# Patient Record
Sex: Female | Born: 1950
Health system: Southern US, Community
[De-identification: ages and names within clinical notes are randomized; demographics above are authoritative.]

## PROBLEM LIST (undated history)

## (undated) ENCOUNTER — Emergency Department (HOSPITAL_COMMUNITY): Payer: Medicare Other | Source: Home / Self Care

## (undated) DIAGNOSIS — K449 Diaphragmatic hernia without obstruction or gangrene: Secondary | ICD-10-CM

## (undated) DIAGNOSIS — I10 Essential (primary) hypertension: Secondary | ICD-10-CM

## (undated) DIAGNOSIS — N201 Calculus of ureter: Secondary | ICD-10-CM

## (undated) DIAGNOSIS — K219 Gastro-esophageal reflux disease without esophagitis: Secondary | ICD-10-CM

## (undated) DIAGNOSIS — Z860100 Personal history of colon polyps, unspecified: Secondary | ICD-10-CM

## (undated) DIAGNOSIS — Z8719 Personal history of other diseases of the digestive system: Secondary | ICD-10-CM

## (undated) DIAGNOSIS — D649 Anemia, unspecified: Secondary | ICD-10-CM

## (undated) DIAGNOSIS — E119 Type 2 diabetes mellitus without complications: Secondary | ICD-10-CM

## (undated) DIAGNOSIS — Z85028 Personal history of other malignant neoplasm of stomach: Secondary | ICD-10-CM

## (undated) DIAGNOSIS — Z794 Long term (current) use of insulin: Secondary | ICD-10-CM

## (undated) HISTORY — PX: VAGINAL HYSTERECTOMY: SUR661

## (undated) HISTORY — PX: UMBILICAL HERNIA REPAIR: SHX196

---

## 1986-06-11 HISTORY — PX: LAPAROSCOPIC PARTIAL GASTRECTOMY: SHX1933

## 1997-08-03 ENCOUNTER — Ambulatory Visit (HOSPITAL_COMMUNITY): Admission: RE | Admit: 1997-08-03 | Discharge: 1997-08-03 | Payer: Self-pay | Admitting: Surgery

## 1998-03-03 ENCOUNTER — Encounter: Payer: Self-pay | Admitting: Otolaryngology

## 1998-03-03 ENCOUNTER — Ambulatory Visit (HOSPITAL_COMMUNITY): Admission: RE | Admit: 1998-03-03 | Discharge: 1998-03-03 | Payer: Self-pay | Admitting: Otolaryngology

## 1998-10-21 ENCOUNTER — Encounter: Payer: Self-pay | Admitting: Surgery

## 1998-10-21 ENCOUNTER — Ambulatory Visit (HOSPITAL_COMMUNITY): Admission: RE | Admit: 1998-10-21 | Discharge: 1998-10-21 | Payer: Self-pay | Admitting: Surgery

## 1999-11-16 ENCOUNTER — Encounter: Payer: Self-pay | Admitting: Obstetrics and Gynecology

## 1999-11-16 ENCOUNTER — Ambulatory Visit (HOSPITAL_COMMUNITY): Admission: RE | Admit: 1999-11-16 | Discharge: 1999-11-16 | Payer: Self-pay | Admitting: Obstetrics and Gynecology

## 1999-12-22 ENCOUNTER — Encounter: Admission: RE | Admit: 1999-12-22 | Discharge: 2000-03-21 | Payer: Self-pay | Admitting: Internal Medicine

## 2000-12-17 ENCOUNTER — Ambulatory Visit (HOSPITAL_COMMUNITY): Admission: RE | Admit: 2000-12-17 | Discharge: 2000-12-17 | Payer: Self-pay | Admitting: Obstetrics and Gynecology

## 2000-12-17 ENCOUNTER — Encounter: Payer: Self-pay | Admitting: Obstetrics and Gynecology

## 2001-12-22 ENCOUNTER — Ambulatory Visit (HOSPITAL_COMMUNITY): Admission: RE | Admit: 2001-12-22 | Discharge: 2001-12-22 | Payer: Self-pay | Admitting: Obstetrics and Gynecology

## 2001-12-22 ENCOUNTER — Encounter: Payer: Self-pay | Admitting: Obstetrics and Gynecology

## 2003-01-06 ENCOUNTER — Encounter: Payer: Self-pay | Admitting: Obstetrics and Gynecology

## 2003-01-06 ENCOUNTER — Ambulatory Visit (HOSPITAL_COMMUNITY): Admission: RE | Admit: 2003-01-06 | Discharge: 2003-01-06 | Payer: Self-pay | Admitting: Obstetrics and Gynecology

## 2004-03-29 ENCOUNTER — Ambulatory Visit (HOSPITAL_COMMUNITY): Admission: RE | Admit: 2004-03-29 | Discharge: 2004-03-29 | Payer: Self-pay | Admitting: Obstetrics and Gynecology

## 2004-10-31 ENCOUNTER — Ambulatory Visit (HOSPITAL_COMMUNITY): Admission: RE | Admit: 2004-10-31 | Discharge: 2004-10-31 | Payer: Self-pay | Admitting: Gastroenterology

## 2004-11-08 ENCOUNTER — Ambulatory Visit (HOSPITAL_COMMUNITY): Admission: RE | Admit: 2004-11-08 | Discharge: 2004-11-08 | Payer: Self-pay | Admitting: Internal Medicine

## 2005-04-16 ENCOUNTER — Ambulatory Visit (HOSPITAL_COMMUNITY): Admission: RE | Admit: 2005-04-16 | Discharge: 2005-04-16 | Payer: Self-pay | Admitting: Obstetrics and Gynecology

## 2005-10-10 ENCOUNTER — Encounter: Payer: Self-pay | Admitting: Otolaryngology

## 2006-04-25 ENCOUNTER — Ambulatory Visit (HOSPITAL_COMMUNITY): Admission: RE | Admit: 2006-04-25 | Discharge: 2006-04-25 | Payer: Self-pay | Admitting: Obstetrics and Gynecology

## 2007-05-29 ENCOUNTER — Ambulatory Visit (HOSPITAL_COMMUNITY): Admission: RE | Admit: 2007-05-29 | Discharge: 2007-05-29 | Payer: Self-pay | Admitting: Obstetrics and Gynecology

## 2010-10-27 NOTE — Op Note (Signed)
Angela Harvey, Angela Harvey                ACCOUNT NO.:  [id number]   MEDICAL RECORD NO.:  [id number]          PATIENT TYPE:  AMB   LOCATION:  ENDO                         FACILITY:  [facility]   PHYSICIAN:  Bernette Redbird, M.D.   DATE OF BIRTH:  10/03/1950   DATE OF PROCEDURE:  10/31/2004  DATE OF DISCHARGE:                                 OPERATIVE REPORT   PROCEDURE:  Colonoscopy (incomplete).   INDICATION:  A 60 year old female without worrisome risk factors or  symptoms, for initial screening examination.   FINDINGS:  Normal exam to the mid colon.  Unable to reach the cecum due to  severe obesity and presence of a ventral hernia.   PROCEDURE:  The nature, purpose, and risks of the procedure had been  discussed with the patient who provided written consent.  Sedation was  fentanyl 75 mcg and Versed 7.5 mg IV without arrhythmias or significant  desaturation (the patient's O2 saturation dropped slightly prior to the  procedure so we turned up the flow rate of the O2, and she remained in the  90s throughout the procedure).   For this procedure, we used the Olympus adult adjustable tension video  colonoscope which was advanced through its entire length of 170 cm.  Initially, the scope protruded through the patient's ventral hernia, but we  then placed the patient in the supine position and applied external  abdominal compression and were able to advance beyond the hernia to full  scope insertion.  The proximal extent of the exam is not known.  Based on  the colonoscopic appearance, and the amount of scope inserted, I would  imagine we were in the midportion of the colon, but it is difficult to be  sure.  Despite taking out loops, etc., it was not possible to advance  farther, so pullback was performed.  The quality of the prep was very good,  and it is felt that all areas up to the limit of the exam were well seen.   Apart from the incomplete character of this exam, this was a normal  examination.  No polyps, cancer, colitis, vascular malformations, or  diverticulosis were noted. and retroflexion of the rectum was normal.  No  biopsies were obtained.  The patient tolerated the procedure well, and there  no apparent complications.   IMPRESSION:  Normal limited screening colonoscopy (V76.51).   Unable to reach cecum due to presence of ventral hernia, and the patient's  obese body habitus with probably some degree of colonic redundancy.   PLAN:  Barium enema this afternoon to check the more proximal sections of  the colon.      RB/MEDQ  D:  10/31/2004  T:  10/31/2004  Job:  [id number]   cc:   Geoffry Paradise, M.D.  [address]  [place]  [place] [address]  Fax: [phone]

## 2012-09-16 ENCOUNTER — Other Ambulatory Visit (HOSPITAL_COMMUNITY): Payer: Self-pay | Admitting: Internal Medicine

## 2012-09-16 DIAGNOSIS — Z1231 Encounter for screening mammogram for malignant neoplasm of breast: Secondary | ICD-10-CM

## 2012-10-15 ENCOUNTER — Ambulatory Visit (HOSPITAL_COMMUNITY): Payer: Self-pay

## 2012-10-16 ENCOUNTER — Ambulatory Visit (HOSPITAL_COMMUNITY)
Admission: RE | Admit: 2012-10-16 | Discharge: 2012-10-16 | Disposition: A | Discharge status: Home / Self Care | Payer: BC Managed Care – PPO | Source: Ambulatory Visit | Attending: Internal Medicine | Admitting: Internal Medicine

## 2012-10-16 DIAGNOSIS — Z1231 Encounter for screening mammogram for malignant neoplasm of breast: Secondary | ICD-10-CM

## 2013-08-26 ENCOUNTER — Other Ambulatory Visit: Payer: Self-pay | Admitting: Internal Medicine

## 2013-08-26 DIAGNOSIS — M549 Dorsalgia, unspecified: Secondary | ICD-10-CM

## 2013-08-30 ENCOUNTER — Ambulatory Visit
Admission: RE | Admit: 2013-08-30 | Discharge: 2013-08-30 | Disposition: A | Discharge status: Home / Self Care | Payer: BC Managed Care – PPO | Source: Ambulatory Visit | Attending: Internal Medicine | Admitting: Internal Medicine

## 2013-08-30 DIAGNOSIS — M549 Dorsalgia, unspecified: Secondary | ICD-10-CM

## 2013-12-18 ENCOUNTER — Other Ambulatory Visit (HOSPITAL_COMMUNITY): Payer: Self-pay | Admitting: Internal Medicine

## 2013-12-18 DIAGNOSIS — Z1231 Encounter for screening mammogram for malignant neoplasm of breast: Secondary | ICD-10-CM

## 2013-12-28 ENCOUNTER — Ambulatory Visit (HOSPITAL_COMMUNITY)
Admission: RE | Admit: 2013-12-28 | Discharge: 2013-12-28 | Disposition: A | Discharge status: Home / Self Care | Payer: BC Managed Care – PPO | Source: Ambulatory Visit | Attending: Internal Medicine | Admitting: Internal Medicine

## 2013-12-28 DIAGNOSIS — Z1231 Encounter for screening mammogram for malignant neoplasm of breast: Secondary | ICD-10-CM | POA: Insufficient documentation

## 2014-06-21 DIAGNOSIS — I10 Essential (primary) hypertension: Secondary | ICD-10-CM | POA: Diagnosis not present

## 2014-06-21 DIAGNOSIS — E119 Type 2 diabetes mellitus without complications: Secondary | ICD-10-CM | POA: Diagnosis not present

## 2014-06-21 DIAGNOSIS — K219 Gastro-esophageal reflux disease without esophagitis: Secondary | ICD-10-CM | POA: Diagnosis not present

## 2014-06-21 DIAGNOSIS — E669 Obesity, unspecified: Secondary | ICD-10-CM | POA: Diagnosis not present

## 2014-06-21 DIAGNOSIS — M1990 Unspecified osteoarthritis, unspecified site: Secondary | ICD-10-CM | POA: Diagnosis not present

## 2014-06-21 DIAGNOSIS — Z6836 Body mass index (BMI) 36.0-36.9, adult: Secondary | ICD-10-CM | POA: Diagnosis not present

## 2014-07-08 DIAGNOSIS — H40013 Open angle with borderline findings, low risk, bilateral: Secondary | ICD-10-CM | POA: Diagnosis not present

## 2014-07-12 DIAGNOSIS — Z8719 Personal history of other diseases of the digestive system: Secondary | ICD-10-CM

## 2014-07-12 HISTORY — DX: Personal history of other diseases of the digestive system: Z87.19

## 2014-08-08 ENCOUNTER — Inpatient Hospital Stay (HOSPITAL_COMMUNITY)
Admission: EM | Admit: 2014-08-08 | Discharge: 2014-08-17 | DRG: 418 | Disposition: A | Discharge status: Home / Self Care | Payer: Medicare Other | Attending: Internal Medicine | Admitting: Internal Medicine

## 2014-08-08 ENCOUNTER — Emergency Department (HOSPITAL_COMMUNITY): Payer: Medicare Other

## 2014-08-08 ENCOUNTER — Encounter (HOSPITAL_COMMUNITY): Payer: Self-pay | Admitting: Emergency Medicine

## 2014-08-08 DIAGNOSIS — E43 Unspecified severe protein-calorie malnutrition: Secondary | ICD-10-CM

## 2014-08-08 DIAGNOSIS — R932 Abnormal findings on diagnostic imaging of liver and biliary tract: Secondary | ICD-10-CM | POA: Diagnosis not present

## 2014-08-08 DIAGNOSIS — Z90710 Acquired absence of both cervix and uterus: Secondary | ICD-10-CM | POA: Diagnosis not present

## 2014-08-08 DIAGNOSIS — Z6834 Body mass index (BMI) 34.0-34.9, adult: Secondary | ICD-10-CM

## 2014-08-08 DIAGNOSIS — R1010 Upper abdominal pain, unspecified: Secondary | ICD-10-CM | POA: Diagnosis not present

## 2014-08-08 DIAGNOSIS — I10 Essential (primary) hypertension: Secondary | ICD-10-CM | POA: Diagnosis present

## 2014-08-08 DIAGNOSIS — N390 Urinary tract infection, site not specified: Secondary | ICD-10-CM | POA: Diagnosis not present

## 2014-08-08 DIAGNOSIS — K859 Acute pancreatitis without necrosis or infection, unspecified: Secondary | ICD-10-CM

## 2014-08-08 DIAGNOSIS — E440 Moderate protein-calorie malnutrition: Secondary | ICD-10-CM | POA: Diagnosis present

## 2014-08-08 DIAGNOSIS — E119 Type 2 diabetes mellitus without complications: Secondary | ICD-10-CM | POA: Diagnosis not present

## 2014-08-08 DIAGNOSIS — E118 Type 2 diabetes mellitus with unspecified complications: Secondary | ICD-10-CM | POA: Diagnosis not present

## 2014-08-08 DIAGNOSIS — R112 Nausea with vomiting, unspecified: Secondary | ICD-10-CM | POA: Diagnosis not present

## 2014-08-08 DIAGNOSIS — D649 Anemia, unspecified: Secondary | ICD-10-CM | POA: Diagnosis present

## 2014-08-08 DIAGNOSIS — Z79899 Other long term (current) drug therapy: Secondary | ICD-10-CM

## 2014-08-08 DIAGNOSIS — Z9884 Bariatric surgery status: Secondary | ICD-10-CM

## 2014-08-08 DIAGNOSIS — K219 Gastro-esophageal reflux disease without esophagitis: Secondary | ICD-10-CM | POA: Diagnosis present

## 2014-08-08 DIAGNOSIS — K8020 Calculus of gallbladder without cholecystitis without obstruction: Secondary | ICD-10-CM | POA: Diagnosis not present

## 2014-08-08 DIAGNOSIS — R1084 Generalized abdominal pain: Secondary | ICD-10-CM | POA: Diagnosis not present

## 2014-08-08 DIAGNOSIS — Z419 Encounter for procedure for purposes other than remedying health state, unspecified: Secondary | ICD-10-CM

## 2014-08-08 DIAGNOSIS — R1013 Epigastric pain: Secondary | ICD-10-CM | POA: Diagnosis not present

## 2014-08-08 DIAGNOSIS — K851 Biliary acute pancreatitis without necrosis or infection: Secondary | ICD-10-CM | POA: Diagnosis present

## 2014-08-08 DIAGNOSIS — K760 Fatty (change of) liver, not elsewhere classified: Secondary | ICD-10-CM | POA: Diagnosis not present

## 2014-08-08 DIAGNOSIS — R9431 Abnormal electrocardiogram [ECG] [EKG]: Secondary | ICD-10-CM | POA: Diagnosis not present

## 2014-08-08 DIAGNOSIS — K8010 Calculus of gallbladder with chronic cholecystitis without obstruction: Secondary | ICD-10-CM | POA: Diagnosis present

## 2014-08-08 DIAGNOSIS — E876 Hypokalemia: Secondary | ICD-10-CM | POA: Diagnosis not present

## 2014-08-08 DIAGNOSIS — K449 Diaphragmatic hernia without obstruction or gangrene: Secondary | ICD-10-CM | POA: Diagnosis present

## 2014-08-08 DIAGNOSIS — K8080 Other cholelithiasis without obstruction: Secondary | ICD-10-CM | POA: Diagnosis not present

## 2014-08-08 HISTORY — DX: Essential (primary) hypertension: I10

## 2014-08-08 HISTORY — DX: Diaphragmatic hernia without obstruction or gangrene: K44.9

## 2014-08-08 LAB — URINALYSIS, ROUTINE W REFLEX MICROSCOPIC
Glucose, UA: >1000 mg/dL — AB
Hgb urine dipstick: NEGATIVE
Ketones, ur: >80 mg/dL — AB
Nitrite: NEGATIVE
Protein, ur: 30 mg/dL — AB
Specific Gravity, Urine: 1.029 (ref 1.005–1.030)
Urobilinogen, UA: 0.2 mg/dL (ref 0.0–1.0)
pH: 5.5 (ref 5.0–8.0)

## 2014-08-08 LAB — COMPREHENSIVE METABOLIC PANEL
ALT: 21 U/L (ref 0–35)
AST: 29 U/L (ref 0–37)
Albumin: 4.1 g/dL (ref 3.5–5.2)
Alkaline Phosphatase: 59 U/L (ref 39–117)
Anion gap: 15 (ref 5–15)
BUN: 17 mg/dL (ref 6–23)
CO2: 28 mmol/L (ref 19–32)
Calcium: 11.9 mg/dL — ABNORMAL HIGH (ref 8.4–10.5)
Chloride: 96 mmol/L (ref 96–112)
Creatinine, Ser: 0.87 mg/dL (ref 0.50–1.10)
GFR calc Af Amer: 79 mL/min — ABNORMAL LOW (ref >90)
GFR calc non Af Amer: 68 mL/min — ABNORMAL LOW (ref >90)
Glucose, Bld: 380 mg/dL — ABNORMAL HIGH (ref 70–99)
Potassium: 2.4 mmol/L — CL (ref 3.5–5.1)
Sodium: 139 mmol/L (ref 135–145)
Total Bilirubin: 1 mg/dL (ref 0.3–1.2)
Total Protein: 7.2 g/dL (ref 6.0–8.3)

## 2014-08-08 LAB — GLUCOSE, CAPILLARY: Glucose-Capillary: 302 mg/dL — ABNORMAL HIGH (ref 70–99)

## 2014-08-08 LAB — CBC WITH DIFFERENTIAL/PLATELET
Basophils Absolute: 0 10*3/uL (ref 0.0–0.1)
Basophils Relative: 0 % (ref 0–1)
Eosinophils Absolute: 0 10*3/uL (ref 0.0–0.7)
Eosinophils Relative: 0 % (ref 0–5)
HCT: 43.8 % (ref 36.0–46.0)
Hemoglobin: 15.5 g/dL — ABNORMAL HIGH (ref 12.0–15.0)
Lymphocytes Relative: 3 % — ABNORMAL LOW (ref 12–46)
Lymphs Abs: 0.6 10*3/uL — ABNORMAL LOW (ref 0.7–4.0)
MCH: 31.4 pg (ref 26.0–34.0)
MCHC: 35.4 g/dL (ref 30.0–36.0)
MCV: 88.8 fL (ref 78.0–100.0)
Monocytes Absolute: 1.6 10*3/uL — ABNORMAL HIGH (ref 0.1–1.0)
Monocytes Relative: 8 % (ref 3–12)
Neutro Abs: 17.7 10*3/uL — ABNORMAL HIGH (ref 1.7–7.7)
Neutrophils Relative %: 89 % — ABNORMAL HIGH (ref 43–77)
Platelets: 287 10*3/uL (ref 150–400)
RBC: 4.93 MIL/uL (ref 3.87–5.11)
RDW: 13.3 % (ref 11.5–15.5)
WBC: 19.9 10*3/uL — ABNORMAL HIGH (ref 4.0–10.5)

## 2014-08-08 LAB — LIPASE, BLOOD: Lipase: 1435 U/L — ABNORMAL HIGH (ref 11–59)

## 2014-08-08 LAB — URINE MICROSCOPIC-ADD ON

## 2014-08-08 LAB — MAGNESIUM: Magnesium: 0.8 mg/dL — CL (ref 1.5–2.5)

## 2014-08-08 MED ORDER — INSULIN ASPART 100 UNIT/ML ~~LOC~~ SOLN
0.0000 [IU] | Freq: Three times a day (TID) | SUBCUTANEOUS | Status: DC
  Administered 2014-08-09 – 2014-08-10 (×5): 3 [IU] via SUBCUTANEOUS
  Administered 2014-08-10 – 2014-08-11 (×2): 5 [IU] via SUBCUTANEOUS
  Administered 2014-08-11 – 2014-08-12 (×4): 3 [IU] via SUBCUTANEOUS
  Administered 2014-08-12: 2 [IU] via SUBCUTANEOUS
  Administered 2014-08-13: 7 [IU] via SUBCUTANEOUS
  Administered 2014-08-13: 2 [IU] via SUBCUTANEOUS
  Administered 2014-08-14: 3 [IU] via SUBCUTANEOUS
  Administered 2014-08-14 – 2014-08-15 (×2): 2 [IU] via SUBCUTANEOUS

## 2014-08-08 MED ORDER — ONDANSETRON HCL 4 MG PO TABS
4.0000 mg | ORAL_TABLET | Freq: Four times a day (QID) | ORAL | Status: DC | PRN
  Administered 2014-08-13 (×2): 4 mg via ORAL
  Filled 2014-08-08 (×2): qty 1

## 2014-08-08 MED ORDER — ONDANSETRON HCL 4 MG/2ML IJ SOLN
4.0000 mg | Freq: Four times a day (QID) | INTRAMUSCULAR | Status: DC | PRN
  Administered 2014-08-09 – 2014-08-12 (×8): 4 mg via INTRAVENOUS
  Filled 2014-08-08 (×9): qty 2

## 2014-08-08 MED ORDER — MORPHINE SULFATE 4 MG/ML IJ SOLN
4.0000 mg | Freq: Once | INTRAMUSCULAR | Status: AC
  Administered 2014-08-08: 4 mg via INTRAVENOUS
  Filled 2014-08-08: qty 1

## 2014-08-08 MED ORDER — HYDROCODONE-ACETAMINOPHEN 5-325 MG PO TABS
1.0000 | ORAL_TABLET | ORAL | Status: DC | PRN
  Administered 2014-08-08: 1 via ORAL
  Administered 2014-08-09 – 2014-08-13 (×7): 2 via ORAL
  Administered 2014-08-14: 1 via ORAL
  Administered 2014-08-14: 2 via ORAL
  Filled 2014-08-08: qty 1
  Filled 2014-08-08 (×9): qty 2

## 2014-08-08 MED ORDER — ENOXAPARIN SODIUM 40 MG/0.4ML ~~LOC~~ SOLN
40.0000 mg | SUBCUTANEOUS | Status: DC
  Administered 2014-08-09 – 2014-08-12 (×4): 40 mg via SUBCUTANEOUS
  Filled 2014-08-08 (×5): qty 0.4

## 2014-08-08 MED ORDER — METOCLOPRAMIDE HCL 10 MG PO TABS
10.0000 mg | ORAL_TABLET | Freq: Three times a day (TID) | ORAL | Status: DC
  Administered 2014-08-08 – 2014-08-11 (×8): 10 mg via ORAL
  Filled 2014-08-08 (×10): qty 1

## 2014-08-08 MED ORDER — ADULT MULTIVITAMIN W/MINERALS CH
1.0000 | ORAL_TABLET | Freq: Every day | ORAL | Status: DC
  Administered 2014-08-09 – 2014-08-15 (×6): 1 via ORAL
  Filled 2014-08-08 (×8): qty 1

## 2014-08-08 MED ORDER — MORPHINE SULFATE 4 MG/ML IJ SOLN
4.0000 mg | INTRAMUSCULAR | Status: DC | PRN
  Administered 2014-08-09 – 2014-08-10 (×7): 4 mg via INTRAVENOUS
  Filled 2014-08-08 (×7): qty 1

## 2014-08-08 MED ORDER — OCUVITE PO TABS
1.0000 | ORAL_TABLET | Freq: Every day | ORAL | Status: DC
  Administered 2014-08-09 – 2014-08-15 (×7): 1 via ORAL
  Filled 2014-08-08 (×8): qty 1

## 2014-08-08 MED ORDER — POTASSIUM CHLORIDE 10 MEQ/100ML IV SOLN
10.0000 meq | Freq: Once | INTRAVENOUS | Status: AC
  Administered 2014-08-08: 10 meq via INTRAVENOUS
  Filled 2014-08-08: qty 100

## 2014-08-08 MED ORDER — INSULIN ASPART 100 UNIT/ML ~~LOC~~ SOLN
0.0000 [IU] | Freq: Every day | SUBCUTANEOUS | Status: DC
  Administered 2014-08-08: 4 [IU] via SUBCUTANEOUS
  Administered 2014-08-09 – 2014-08-13 (×4): 2 [IU] via SUBCUTANEOUS

## 2014-08-08 MED ORDER — ACETAMINOPHEN 650 MG RE SUPP: 650.0000 mg | Freq: Four times a day (QID) | RECTAL | Status: DC | PRN

## 2014-08-08 MED ORDER — POTASSIUM CHLORIDE CRYS ER 20 MEQ PO TBCR
40.0000 meq | EXTENDED_RELEASE_TABLET | Freq: Once | ORAL | Status: AC
  Administered 2014-08-08: 40 meq via ORAL
  Filled 2014-08-08: qty 2

## 2014-08-08 MED ORDER — MAGNESIUM SULFATE 2 GM/50ML IV SOLN
2.0000 g | Freq: Once | INTRAVENOUS | Status: AC
  Administered 2014-08-08: 2 g via INTRAVENOUS
  Filled 2014-08-08: qty 50

## 2014-08-08 MED ORDER — POTASSIUM CHLORIDE IN NACL 40-0.9 MEQ/L-% IV SOLN
INTRAVENOUS | Status: DC
  Administered 2014-08-08 – 2014-08-12 (×7): 75 mL/h via INTRAVENOUS
  Filled 2014-08-08 (×9): qty 1000

## 2014-08-08 MED ORDER — INSULIN GLARGINE 100 UNIT/ML ~~LOC~~ SOLN
10.0000 [IU] | Freq: Every day | SUBCUTANEOUS | Status: DC
  Administered 2014-08-08: 10 [IU] via SUBCUTANEOUS
  Filled 2014-08-08: qty 0.1

## 2014-08-08 MED ORDER — ALUM & MAG HYDROXIDE-SIMETH 200-200-20 MG/5ML PO SUSP
30.0000 mL | Freq: Four times a day (QID) | ORAL | Status: DC | PRN
  Administered 2014-08-13: 30 mL via ORAL
  Filled 2014-08-08: qty 30

## 2014-08-08 MED ORDER — ACETAMINOPHEN 325 MG PO TABS: 650.0000 mg | ORAL_TABLET | Freq: Four times a day (QID) | ORAL | Status: DC | PRN

## 2014-08-08 MED ORDER — POLYETHYLENE GLYCOL 3350 17 G PO PACK: 17.0000 g | PACK | Freq: Every day | ORAL | Status: DC | PRN

## 2014-08-08 MED ORDER — SERTRALINE HCL 100 MG PO TABS
100.0000 mg | ORAL_TABLET | Freq: Every day | ORAL | Status: DC
  Administered 2014-08-09 – 2014-08-15 (×7): 100 mg via ORAL
  Filled 2014-08-08 (×8): qty 1

## 2014-08-08 MED ORDER — IOHEXOL 300 MG/ML  SOLN
100.0000 mL | Freq: Once | INTRAMUSCULAR | Status: AC | PRN
  Administered 2014-08-08: 100 mL via INTRAVENOUS

## 2014-08-08 MED ORDER — ZOLPIDEM TARTRATE 5 MG PO TABS
5.0000 mg | ORAL_TABLET | Freq: Every evening | ORAL | Status: DC | PRN
  Administered 2014-08-08 – 2014-08-13 (×3): 5 mg via ORAL
  Filled 2014-08-08 (×3): qty 1

## 2014-08-08 MED ORDER — ONDANSETRON HCL 4 MG/2ML IJ SOLN
4.0000 mg | Freq: Once | INTRAMUSCULAR | Status: AC
Start: 2014-08-08 — End: 2014-08-08
  Administered 2014-08-08: 4 mg via INTRAVENOUS
  Filled 2014-08-08: qty 2

## 2014-08-08 MED ORDER — CELECOXIB 200 MG PO CAPS
200.0000 mg | ORAL_CAPSULE | Freq: Every day | ORAL | Status: DC
  Filled 2014-08-08: qty 1

## 2014-08-08 NOTE — ED Provider Notes (Signed)
CSN: [id number]     Arrival date & time 08/08/14  1219 History   First MD Initiated Contact with Patient 08/08/14 1401     Chief Complaint  Patient presents with  . Abdominal Pain  . Nausea    Angela Harvey is a 64 y.o. female with a history of diabetes, hypertension, hiatal hernia and bariatric surgery who presents the emergency department complaining of nausea and vomiting ongoing for 1 week with severe abdominal pain generalized her abdomen since last night. She reports vomiting twice today. She also reports lots of belching and taking 5 tums a day. She reports nausea even with drinking water. She reports it seems to be worse at night. She currently rates her abdominal pain at 6 out of 10 and generalized her abdomen and worse with movement. She is unable to localize an area of pain in her abdomen. Her last BM was 3 days ago and was normal. She is followed by gastroenterologist Dr. Dorrene German. She denies history of pancreatitis. The patient denies fevers, chills, sick contacts, chest pain, shortness of breath, palpitations, hematemesis, hematochezia, diarrhea.  (Consider location/radiation/quality/duration/timing/severity/associated sxs/prior Treatment) HPI  Past Medical History  Diagnosis Date  . Hiatal hernia   . Hypertension   . Diabetes mellitus without complication    Past Surgical History  Procedure Laterality Date  . Hernia repair     No family history on file. History  Substance Use Topics  . Smoking status: Never Smoker   . Smokeless tobacco: Not on file  . Alcohol Use: No   OB History    No data available     Review of Systems  Constitutional: Negative for fever and chills.  HENT: Negative for congestion and sore throat.   Eyes: Negative for pain and visual disturbance.  Respiratory: Negative for cough, shortness of breath and wheezing.   Cardiovascular: Negative for chest pain and palpitations.  Gastrointestinal: Positive for nausea, vomiting and abdominal pain.  Negative for diarrhea and blood in stool.  Genitourinary: Negative for dysuria, urgency, frequency, hematuria, flank pain, vaginal bleeding, vaginal discharge and difficulty urinating.  Musculoskeletal: Negative for back pain and neck pain.  Skin: Negative for rash.  Neurological: Negative for weakness, numbness and headaches.      Allergies  Review of patient's allergies indicates no known allergies.  Home Medications   Prior to Admission medications   Medication Sig Start Date End Date Taking? Authorizing Provider  celecoxib (CELEBREX) 200 MG capsule Take 200 mg by mouth daily.  07/28/14  Yes Historical Provider, MD  KRILL OIL PO Take 1 capsule by mouth daily.   Yes Historical Provider, MD  metFORMIN (GLUCOPHAGE) 500 MG tablet Take 500 mg by mouth 2 (two) times daily. 07/08/14  Yes Historical Provider, MD  metoCLOPramide (REGLAN) 10 MG tablet Take 10 mg by mouth 3 (three) times daily. 07/08/14  Yes Historical Provider, MD  Multiple Vitamin (MULTIVITAMIN WITH MINERALS) TABS tablet Take 1 tablet by mouth daily.   Yes Historical Provider, MD  multivitamin-lutein (OCUVITE-LUTEIN) CAPS capsule Take 1 capsule by mouth daily.   Yes Historical Provider, MD  sertraline (ZOLOFT) 100 MG tablet Take 100 mg by mouth daily. 07/08/14  Yes Historical Provider, MD   BP 182/71 mmHg  Pulse 75  Temp(Src) 98.2 F (36.8 C) (Oral)  Resp 16  SpO2 98% Physical Exam  Constitutional: She is oriented to person, place, and time. She appears well-developed and well-nourished. No distress.  Obese female. Non-toxic appearing.   HENT:  Head: Normocephalic and atraumatic.  Mouth/Throat: Oropharynx is clear and moist. No oropharyngeal exudate.  Eyes: Conjunctivae are normal. Pupils are equal, round, and reactive to light. Right eye exhibits no discharge. Left eye exhibits no discharge.  Neck: Neck supple.  Cardiovascular: Normal rate, regular rhythm, normal heart sounds and intact distal pulses.  Exam reveals no  gallop and no friction rub.   No murmur heard. Bilateral radial pulses are intact. HR is 88.   Pulmonary/Chest: Effort normal and breath sounds normal. No respiratory distress. She has no wheezes. She has no rales.  Abdominal: Soft. Bowel sounds are normal. She exhibits no distension and no mass. There is tenderness. There is no rebound and no guarding.  Abdomen is soft. Bowel sounds are present. Abdomen is generally tender to palpation worse in her upper abdomen.   Musculoskeletal: She exhibits no edema.  No lower extremity edema.   Lymphadenopathy:    She has no cervical adenopathy.  Neurological: She is alert and oriented to person, place, and time. Coordination normal.  Skin: Skin is warm and dry. No rash noted. She is not diaphoretic. No erythema. No pallor.  Psychiatric: She has a normal mood and affect. Her behavior is normal.  Nursing note and vitals reviewed.   ED Course  Procedures (including critical care time) Labs Review Labs Reviewed  CBC WITH DIFFERENTIAL/PLATELET - Abnormal; Notable for the following:    WBC 19.9 (*)    Hemoglobin 15.5 (*)    Neutrophils Relative % 89 (*)    Neutro Abs 17.7 (*)    Lymphocytes Relative 3 (*)    Lymphs Abs 0.6 (*)    Monocytes Absolute 1.6 (*)    All other components within normal limits  COMPREHENSIVE METABOLIC PANEL - Abnormal; Notable for the following:    Potassium 2.4 (*)    Glucose, Bld 380 (*)    Calcium 11.9 (*)    GFR calc non Af Amer 68 (*)    GFR calc Af Amer 79 (*)    All other components within normal limits  LIPASE, BLOOD - Abnormal; Notable for the following:    Lipase 1435 (*)    All other components within normal limits  URINALYSIS, ROUTINE W REFLEX MICROSCOPIC - Abnormal; Notable for the following:    APPearance TURBID (*)    Glucose, UA >1000 (*)    Bilirubin Urine MODERATE (*)    Ketones, ur >80 (*)    Protein, ur 30 (*)    Leukocytes, UA TRACE (*)    All other components within normal limits  MAGNESIUM  - Abnormal; Notable for the following:    Magnesium 0.8 (*)    All other components within normal limits  URINE MICROSCOPIC-ADD ON - Abnormal; Notable for the following:    Squamous Epithelial / LPF FEW (*)    Bacteria, UA MANY (*)    All other components within normal limits    Imaging Review No results found.   EKG Interpretation   Date/Time:  Sunday August 08 2014 14:49:46 EST Ventricular Rate:  75 PR Interval:  129 QRS Duration: 89 QT Interval:  377 QTC Calculation: 421 R Axis:   -21 Text Interpretation:  Normal sinus rhythm Borderline left axis deviation  Low voltage, precordial leads Borderline T abnormalities, anterior leads  No old tracing to compare Confirmed by [place] ([id number]) on  08/08/2014 2:55:20 PM      Filed Vitals:   08/08/14 1229 08/08/14 1505 08/08/14 1621  BP: 136/97 182/83 182/71  Pulse: 91 73 75  Temp: 98.2 F (36.8 C)    TempSrc: Oral    Resp: 16 20 16   SpO2: 100% 98% 98%    MDM   Meds given in ED:  Medications  magnesium sulfate IVPB 2 g 50 mL (2 g Intravenous New Bag/Given 08/08/14 1633)  potassium chloride 10 mEq in 100 mL IVPB (0 mEq Intravenous Stopped 08/08/14 1633)  ondansetron (ZOFRAN) injection 4 mg (4 mg Intravenous Given 08/08/14 1600)  morphine 4 MG/ML injection 4 mg (4 mg Intravenous Given 08/08/14 1601)  potassium chloride SA (K-DUR,KLOR-CON) CR tablet 40 mEq (40 mEq Oral Given 08/08/14 1601)  iohexol (OMNIPAQUE) 300 MG/ML solution 100 mL (100 mLs Intravenous Contrast Given 08/08/14 1708)    New Prescriptions   No medications on file    Final diagnoses:  Acute pancreatitis, unspecified pancreatitis type  Hypokalemia  Hypomagnesemia   This is a 64 y.o. female with a history of diabetes, hypertension, hiatal hernia and bariatric surgery who presents the emergency department complaining of nausea and vomiting ongoing for 1 week with severe abdominal pain generalized her abdomen since last night. The patient is afebrile  and nontoxic-appearing. The patient has generalized abdominal tenderness palpation that is worse in her epigastrium.  Patient is hypokalemic with a potassium 2.4. Her magnesium is 0.8. Patient given oral and IV potassium in the ED. EKG shows normal sinus rhythm. She has leukocytosis with a white count of 19.9. Her lipase is elevated at 1435. Plan is to CT abd and likely admission. She has no history of pancreatitis.  17:20 Patient care handed off to [facility] at shift change.   This patient was discussed with and evaluated by Dr. Regenia Skeeter who agrees with assessment and plan.     Hanley Hays, PA-C 08/08/14 1721  Sherwood Gambler, MD 08/16/14 2350

## 2014-08-08 NOTE — ED Provider Notes (Signed)
Patient is a 63 year old female signed out to me at shift change by previous provider. Patient here for 1 week of nausea and vomiting and 2 days of severe epigastric pain. Basic labs show hypokalemia and hypomagnesemia and mild hypercalcemia. Patient also has an elevated lipase level at 1400. Patient has no history of pancreatitis. CT scan is currently pending. Plan is to admit the patient after follow-up on CT scan. Patient seen by Dr. Joya Salm. Physical Exam  BP 182/71 mmHg  Pulse 75  Temp(Src) 98.2 F (36.8 C) (Oral)  Resp 16  SpO2 98%  Physical Exam  Constitutional: She is oriented to person, place, and time. She appears well-developed and well-nourished. No distress.  HENT:  Head: Normocephalic and atraumatic.  Mouth/Throat: Oropharynx is clear and moist. No oropharyngeal exudate.  Eyes: Conjunctivae and EOM are normal. Pupils are equal, round, and reactive to light. No scleral icterus.  Neck: Normal range of motion. Neck supple. No JVD present. No thyromegaly present.  Cardiovascular: Normal rate, regular rhythm and intact distal pulses.  Exam reveals no gallop and no friction rub.   Murmur heard. Pulmonary/Chest: Effort normal and breath sounds normal. No respiratory distress. She has no wheezes. She has no rales. She exhibits no tenderness.  Abdominal: Soft. Normal appearance and bowel sounds are normal. She exhibits no distension and no mass. There is tenderness in the epigastric area. There is no rigidity, no rebound, no guarding, no tenderness at McBurney's point and negative Murphy's sign.  Lymphadenopathy:    She has no cervical adenopathy.  Neurological: She is alert and oriented to person, place, and time.  Skin: Skin is warm and dry. She is not diaphoretic.  Psychiatric: She has a normal mood and affect. Her behavior is normal. Judgment and thought content normal.  Nursing note and vitals reviewed.   ED Course  Procedures  MDM  Patient is a 64 year old female who  presents emergency room for evaluation of epigastric pain with severe nausea and vomiting. On examination patient's pain is currently a 6 out of 10. CT scan shows stranding around the pancreas consistent with acute pancreatitis. There are no other acute abnormalities visualized on the scan. Patient does have hypomagnesemia and hypokalemia which were replaced here in the ED. Patient does have mild hypercalcemia which may be related to excessive Tums use. Will call Dr. Joya Salm for admission. I spoke with the on-call physician for [place] who will come to see the patient here in the ED and will admit the patient.      Cherylann Parr, PA-C 08/08/14 1833  Nat Christen, MD 08/11/14 [phone]

## 2014-08-08 NOTE — H&P (Signed)
PCP:   Geoffery Lyons, MD   Chief Complaint:  Nausea and vomiting, midabdominal pain  HPI: Angela Harvey is a 64 y.o. female with a history of diabetes mellitus type 2, hypertension, and hiatal hernia who presents the emergency department complaining of nausea and vomiting ongoing for 1 week with severe abdominal pain generalized her abdomen since last night. She reports vomiting twice today. She was taking 5 Tums a day to try to get her pain under control.. She reports nausea even with drinking water. She denies alcohol intake. She denies history of pancreatitis, but has an elevated Lipase and an abdominal CT which confirms this diagnosis. She will be admitted for further management.  Review of Systems:  Review of Systems - History obtained from the patient General ROS: positive for  - malaise Ophthalmic ROS: negative ENT ROS: negative Hematological and Lymphatic ROS: negative Endocrine ROS: positive for - polydipsia/polyuria Respiratory ROS: no cough, shortness of breath, or wheezing Cardiovascular ROS: no chest pain or dyspnea on exertion Gastrointestinal ROS: positive for - abdominal pain, appetite loss and nausea/vomiting Genito-Urinary ROS: no dysuria, trouble voiding, or hematuria Musculoskeletal ROS: negative Neurological ROS: no TIA or stroke symptoms Past Medical History: Past Medical History  Diagnosis Date  . Hiatal hernia   . Hypertension   . Diabetes mellitus without complication    Past Surgical History  Procedure Laterality Date  . Hernia repair      Medications: Prior to Admission medications   Medication Sig Start Date End Date Taking? Authorizing Provider  celecoxib (CELEBREX) 200 MG capsule Take 200 mg by mouth daily.  07/28/14  Yes Historical Provider, MD  KRILL OIL PO Take 1 capsule by mouth daily.   Yes Historical Provider, MD  metFORMIN (GLUCOPHAGE) 500 MG tablet Take 500 mg by mouth 2 (two) times daily. 07/08/14  Yes Historical Provider, MD   metoCLOPramide (REGLAN) 10 MG tablet Take 10 mg by mouth 3 (three) times daily. 07/08/14  Yes Historical Provider, MD  Multiple Vitamin (MULTIVITAMIN WITH MINERALS) TABS tablet Take 1 tablet by mouth daily.   Yes Historical Provider, MD  multivitamin-lutein (OCUVITE-LUTEIN) CAPS capsule Take 1 capsule by mouth daily.   Yes Historical Provider, MD  sertraline (ZOLOFT) 100 MG tablet Take 100 mg by mouth daily. 07/08/14  Yes Historical Provider, MD    Allergies:  No Known Allergies  Social History:  reports that she has never smoked. She does not have any smokeless tobacco history on file. She reports that she does not drink alcohol. Her drug history is not on file.  Family History: No family history on file.  Physical Exam: Filed Vitals:   08/08/14 1229 08/08/14 1505 08/08/14 1621 08/08/14 1818  BP: 136/97 182/83 182/71 161/71  Pulse: 91 73 75 95  Temp: 98.2 F (36.8 C)     TempSrc: Oral     Resp: 16 20 16 22   SpO2: 100% 98% 98% 95%   General appearance: alert, cooperative and appears stated age Head: Normocephalic, without obvious abnormality, atraumatic Eyes: conjunctivae/corneas clear. PERRL, EOM's intact.  Nose: Nares normal. Septum midline. Mucosa normal. No drainage or sinus tenderness. Throat: lips, mucosa, and tongue normal; teeth and gums normal Neck: no adenopathy, no carotid bruit, no JVD and thyroid not enlarged, symmetric, no tenderness/mass/nodules Resp: clear to auscultation bilaterally Cardio: regular rate and rhythm, S1, S2 normal, no murmur, click, rub or gallop GI: soft,globally tender, but worse in upper abdome without rebound pain. Extremities: extremities normal, atraumatic, no cyanosis or edema Pulses: 2+  and symmetric Lymph nodes: Cervical adenopathy: no cervical lymphadenopathy Neurologic: Alert and oriented X 3, normal strength and tone. Normal symmetric reflexes.     Labs on Admission:   Recent Labs  08/08/14 1235  NA 139  K 2.4*  CL 96  CO2  28  GLUCOSE 380*  BUN 17  CREATININE 0.87  CALCIUM 11.9*  MG 0.8*    Recent Labs  08/08/14 1235  AST 29  ALT 21  ALKPHOS 59  BILITOT 1.0  PROT 7.2  ALBUMIN 4.1    Recent Labs  08/08/14 1235  LIPASE 1435*    Recent Labs  08/08/14 1235  WBC 19.9*  NEUTROABS 17.7*  HGB 15.5*  HCT 43.8  MCV 88.8  PLT 287   No results for input(s): CKTOTAL, CKMB, CKMBINDEX, TROPONINI in the last 72 hours. No results found for: INR, PROTIME No results for input(s): TSH, T4TOTAL, T3FREE, THYROIDAB in the last 72 hours.  Invalid input(s): FREET3 No results for input(s): VITAMINB12, FOLATE, FERRITIN, TIBC, IRON, RETICCTPCT in the last 72 hours.  Radiological Exams on Admission: Ct Abdomen Pelvis W Contrast  08/08/2014   ADDENDUM REPORT: 08/08/2014 17:51  ADDENDUM: Typographical error within the impression. The first sentence should read  Edematous appearing pancreas with extensive stranding and fluid around the pancreas and in the left anterior para renal space compatible with acute pancreatitis.   Electronically Signed   By: Rolm Baptise M.D.   On: 08/08/2014 17:51   08/08/2014   CLINICAL DATA:  Generalized abdominal pain beginning last night. Nausea, vomiting.  EXAM: CT ABDOMEN AND PELVIS WITH CONTRAST  TECHNIQUE: Multidetector CT imaging of the abdomen and pelvis was performed using the standard protocol following bolus administration of intravenous contrast.  CONTRAST:  141mL OMNIPAQUE IOHEXOL 300 MG/ML  SOLN  COMPARISON:  12/11/2012  FINDINGS: The distal scratch head the visualized distal esophagus is fluid-filled and dilated, possibly related to reflux. Lung bases are clear. No effusions. Heart is normal size.  Postoperative changes in the stomach. There is stranding noted around the pancreas with fluid in the left anterior para renal space and inferior to the pancreas compatible with acute pancreatitis. Pancreas looks prominent/edematous. No focal abnormality.  Mild diffuse fatty  infiltration of the liver. Gallbladder, spleen, adrenals and kidneys are normal. Large and small bowel are unremarkable. There is free fluid in the cul-de-sac of the pelvis. Prior hysterectomy. No adnexal masses. Urinary bladder is unremarkable.  Degenerative changes in the lower lumbar spine. No acute bony abnormality or focal bone lesion.  IMPRESSION: Edematous appearing pancreas with extensive stranding and fluid around the pancreas and in the left anterior para renal space compatible with acute appendicitis. Dilated, fluid-filled distal esophagus, presumably related to reflux. Postoperative changes in the region of the stomach.  Mild diffuse fatty infiltration of the liver.  Electronically Signed: By: Rolm Baptise M.D. On: 08/08/2014 17:33   Orders placed or performed during the hospital encounter of 08/08/14  . ED EKG  . ED EKG  . EKG 12-Lead  . EKG 12-Lead    Assessment/Plan Active Problems:   Acute pancreatitis IV hydration, NPO for now, repeat Lipase levels in AM.  Confirmed on CT.  May need to consider GI eval if not improving with bowel rest.  IV fluids. Hypokalemia:  Replaced in ER< high K+ IV fluids initiated. Hypomagnesemia:  Replaced in ER< recheck in AM Diabetes Mellitis Type 2:  Glucose in urine, CBG 38-0 on admission.  Will hold her metformin and initiate basal insulin +  SSI as well HTN:  Fluids, follow BP, may need to add agents as becomes volume replete.  TISOVEC,RICHARD W 08/08/2014, 7:34 PM

## 2014-08-08 NOTE — ED Notes (Signed)
Pt from home c/o generalized abdominal pain since last pm with nausea and vomiting. Denies urinary symptoms. She reports she has vomited 12 times in last 24 hours.

## 2014-08-09 ENCOUNTER — Encounter (HOSPITAL_COMMUNITY): Payer: Self-pay

## 2014-08-09 LAB — COMPREHENSIVE METABOLIC PANEL
ALT: 16 U/L (ref 0–35)
AST: 25 U/L (ref 0–37)
Albumin: 3.4 g/dL — ABNORMAL LOW (ref 3.5–5.2)
Alkaline Phosphatase: 54 U/L (ref 39–117)
Anion gap: 10 (ref 5–15)
BUN: 22 mg/dL (ref 6–23)
CO2: 31 mmol/L (ref 19–32)
Calcium: 10.8 mg/dL — ABNORMAL HIGH (ref 8.4–10.5)
Chloride: 100 mmol/L (ref 96–112)
Creatinine, Ser: 0.79 mg/dL (ref 0.50–1.10)
GFR calc Af Amer: >90 mL/min (ref >90)
GFR calc non Af Amer: 86 mL/min — ABNORMAL LOW (ref >90)
Glucose, Bld: 238 mg/dL — ABNORMAL HIGH (ref 70–99)
Potassium: 3 mmol/L — ABNORMAL LOW (ref 3.5–5.1)
Sodium: 141 mmol/L (ref 135–145)
Total Bilirubin: 0.7 mg/dL (ref 0.3–1.2)
Total Protein: 6.2 g/dL (ref 6.0–8.3)

## 2014-08-09 LAB — GLUCOSE, CAPILLARY
Glucose-Capillary: 233 mg/dL — ABNORMAL HIGH (ref 70–99)
Glucose-Capillary: 247 mg/dL — ABNORMAL HIGH (ref 70–99)
Glucose-Capillary: 245 mg/dL — ABNORMAL HIGH (ref 70–99)
Glucose-Capillary: 204 mg/dL — ABNORMAL HIGH (ref 70–99)

## 2014-08-09 LAB — LIPASE, BLOOD: Lipase: 839 U/L — ABNORMAL HIGH (ref 11–59)

## 2014-08-09 LAB — CBC
HCT: 41.2 % (ref 36.0–46.0)
Hemoglobin: 14.4 g/dL (ref 12.0–15.0)
MCH: 31.1 pg (ref 26.0–34.0)
MCHC: 35 g/dL (ref 30.0–36.0)
MCV: 89 fL (ref 78.0–100.0)
Platelets: 267 10*3/uL (ref 150–400)
RBC: 4.63 MIL/uL (ref 3.87–5.11)
RDW: 13.6 % (ref 11.5–15.5)
WBC: 22.8 10*3/uL — ABNORMAL HIGH (ref 4.0–10.5)

## 2014-08-09 LAB — MAGNESIUM: Magnesium: 1.2 mg/dL — ABNORMAL LOW (ref 1.5–2.5)

## 2014-08-09 MED ORDER — INSULIN GLARGINE 100 UNIT/ML ~~LOC~~ SOLN
15.0000 [IU] | Freq: Every day | SUBCUTANEOUS | Status: DC
  Administered 2014-08-09 – 2014-08-15 (×7): 15 [IU] via SUBCUTANEOUS
  Filled 2014-08-09 (×7): qty 0.15

## 2014-08-09 MED ORDER — ENOXAPARIN SODIUM 40 MG/0.4ML ~~LOC~~ SOLN: 40.0000 mg | SUBCUTANEOUS | Status: DC

## 2014-08-09 MED ORDER — POTASSIUM CHLORIDE 10 MEQ/100ML IV SOLN
10.0000 meq | INTRAVENOUS | Status: AC
  Administered 2014-08-09 (×2): 10 meq via INTRAVENOUS
  Filled 2014-08-09 (×4): qty 100

## 2014-08-09 MED ORDER — PANTOPRAZOLE SODIUM 40 MG IV SOLR
40.0000 mg | Freq: Every day | INTRAVENOUS | Status: DC
Start: 2014-08-09 — End: 2014-08-13
  Administered 2014-08-09 – 2014-08-12 (×4): 40 mg via INTRAVENOUS
  Filled 2014-08-09 (×5): qty 40

## 2014-08-09 NOTE — Progress Notes (Signed)
Subjective: Episode of emesis this am noted--claims pain much better  Objective: Vital signs in last 24 hours: Temp:  [98.2 F (36.8 C)-98.9 F (37.2 C)] 98.7 F (37.1 C) (02/29 0626) Pulse Rate:  [73-102] 101 (02/29 0230) Resp:  [16-27] 18 (02/29 0626) BP: (136-182)/(62-97) 161/83 mmHg (02/29 0626) SpO2:  [91 %-100 %] 98 % (02/29 0626) Weight:  [89.1 kg (196 lb 6.9 oz)] 89.1 kg (196 lb 6.9 oz) (02/28 2039) Weight change:   CBG (last 3)   Recent Labs  08/08/14 2200 08/09/14 0736  GLUCAP 302* 233*    Intake/Output from previous day:    Physical Exam: A/a no distress, bright and conversant No jvd Chest clear rrr-no murmur Abdomen soft, good bs, minimal epigastric tenderness No edema Intact pulses   Lab Results:  Recent Labs  08/08/14 1235 08/09/14 0522  NA 139 141  K 2.4* 3.0*  CL 96 100  CO2 28 31  GLUCOSE 380* 238*  BUN 17 22  CREATININE 0.87 0.79  CALCIUM 11.9* 10.8*  MG 0.8* 1.2*    Recent Labs  08/08/14 1235 08/09/14 0522  AST 29 25  ALT 21 16  ALKPHOS 59 54  BILITOT 1.0 0.7  PROT 7.2 6.2  ALBUMIN 4.1 3.4*    Recent Labs  08/08/14 1235 08/09/14 0522  WBC 19.9* 22.8*  NEUTROABS 17.7*  --   HGB 15.5* 14.4  HCT 43.8 41.2  MCV 88.8 89.0  PLT 287 267   No results found for: INR, PROTIME No results for input(s): CKTOTAL, CKMB, CKMBINDEX, TROPONINI in the last 72 hours. No results for input(s): TSH, T4TOTAL, T3FREE, THYROIDAB in the last 72 hours.  Invalid input(s): FREET3 No results for input(s): VITAMINB12, FOLATE, FERRITIN, TIBC, IRON, RETICCTPCT in the last 72 hours.  Studies/Results: Ct Abdomen Pelvis W Contrast  08/08/2014   ADDENDUM REPORT: 08/08/2014 17:51  ADDENDUM: Typographical error within the impression. The first sentence should read  Edematous appearing pancreas with extensive stranding and fluid around the pancreas and in the left anterior para renal space compatible with acute pancreatitis.   Electronically Signed    By: Rolm Baptise M.D.   On: 08/08/2014 17:51   08/08/2014   CLINICAL DATA:  Generalized abdominal pain beginning last night. Nausea, vomiting.  EXAM: CT ABDOMEN AND PELVIS WITH CONTRAST  TECHNIQUE: Multidetector CT imaging of the abdomen and pelvis was performed using the standard protocol following bolus administration of intravenous contrast.  CONTRAST:  171mL OMNIPAQUE IOHEXOL 300 MG/ML  SOLN  COMPARISON:  12/11/2012  FINDINGS: The distal scratch head the visualized distal esophagus is fluid-filled and dilated, possibly related to reflux. Lung bases are clear. No effusions. Heart is normal size.  Postoperative changes in the stomach. There is stranding noted around the pancreas with fluid in the left anterior para renal space and inferior to the pancreas compatible with acute pancreatitis. Pancreas looks prominent/edematous. No focal abnormality.  Mild diffuse fatty infiltration of the liver. Gallbladder, spleen, adrenals and kidneys are normal. Large and small bowel are unremarkable. There is free fluid in the cul-de-sac of the pelvis. Prior hysterectomy. No adnexal masses. Urinary bladder is unremarkable.  Degenerative changes in the lower lumbar spine. No acute bony abnormality or focal bone lesion.  IMPRESSION: Edematous appearing pancreas with extensive stranding and fluid around the pancreas and in the left anterior para renal space compatible with acute appendicitis. Dilated, fluid-filled distal esophagus, presumably related to reflux. Postoperative changes in the region of the stomach.  Mild diffuse fatty infiltration of the  liver.  Electronically Signed: By: Rolm Baptise M.D. On: 08/08/2014 17:33     Assessment/Plan: 1. Acute pancreatitis- pain improved, lipase dropping- bowel rest, ivf, ppi 2. DM2- no complications-will hold metformin 3. HTN- stable 4. Pyuria--send urine culture-empiric cipro given wbc   LOS: 1 day   Angela Harvey A 08/09/2014, 7:57 AM

## 2014-08-09 NOTE — Progress Notes (Signed)
Ms Steffler alert and oriented , reported an episode of about 50 ml coffee color/ dark brown  Emesis. Pt had about 133ml of clear to tan color  Emesis also  Overnight.

## 2014-08-09 NOTE — Progress Notes (Signed)
UR complete 

## 2014-08-10 ENCOUNTER — Inpatient Hospital Stay (HOSPITAL_COMMUNITY): Payer: Medicare Other

## 2014-08-10 LAB — COMPREHENSIVE METABOLIC PANEL
ALT: 15 U/L (ref 0–35)
AST: 37 U/L (ref 0–37)
Albumin: 2.9 g/dL — ABNORMAL LOW (ref 3.5–5.2)
Alkaline Phosphatase: 65 U/L (ref 39–117)
Anion gap: 7 (ref 5–15)
BUN: 33 mg/dL — ABNORMAL HIGH (ref 6–23)
CO2: 32 mmol/L (ref 19–32)
Calcium: 9.6 mg/dL (ref 8.4–10.5)
Chloride: 104 mmol/L (ref 96–112)
Creatinine, Ser: 1.02 mg/dL (ref 0.50–1.10)
GFR calc Af Amer: 65 mL/min — ABNORMAL LOW (ref >90)
GFR calc non Af Amer: 56 mL/min — ABNORMAL LOW (ref >90)
Glucose, Bld: 255 mg/dL — ABNORMAL HIGH (ref 70–99)
Potassium: 4.3 mmol/L (ref 3.5–5.1)
Sodium: 143 mmol/L (ref 135–145)
Total Bilirubin: 1.4 mg/dL — ABNORMAL HIGH (ref 0.3–1.2)
Total Protein: 5.5 g/dL — ABNORMAL LOW (ref 6.0–8.3)

## 2014-08-10 LAB — GLUCOSE, CAPILLARY
Glucose-Capillary: 220 mg/dL — ABNORMAL HIGH (ref 70–99)
Glucose-Capillary: 231 mg/dL — ABNORMAL HIGH (ref 70–99)
Glucose-Capillary: 254 mg/dL — ABNORMAL HIGH (ref 70–99)
Glucose-Capillary: 229 mg/dL — ABNORMAL HIGH (ref 70–99)

## 2014-08-10 LAB — HEMOGLOBIN A1C
Hgb A1c MFr Bld: 6.7 % — ABNORMAL HIGH (ref 4.8–5.6)
Mean Plasma Glucose: 146 mg/dL

## 2014-08-10 LAB — LIPASE, BLOOD: Lipase: 160 U/L — ABNORMAL HIGH (ref 11–59)

## 2014-08-10 LAB — CBC
HCT: 35.7 % — ABNORMAL LOW (ref 36.0–46.0)
Hemoglobin: 12.3 g/dL (ref 12.0–15.0)
MCH: 31.5 pg (ref 26.0–34.0)
MCHC: 34.5 g/dL (ref 30.0–36.0)
MCV: 91.5 fL (ref 78.0–100.0)
Platelets: 226 10*3/uL (ref 150–400)
RBC: 3.9 MIL/uL (ref 3.87–5.11)
RDW: 14.1 % (ref 11.5–15.5)
WBC: 17.9 10*3/uL — ABNORMAL HIGH (ref 4.0–10.5)

## 2014-08-10 MED ORDER — IOHEXOL 300 MG/ML  SOLN
100.0000 mL | Freq: Once | INTRAMUSCULAR | Status: AC | PRN
  Administered 2014-08-10: 100 mL via INTRAVENOUS

## 2014-08-10 MED ORDER — BISACODYL 10 MG RE SUPP
10.0000 mg | Freq: Once | RECTAL | Status: AC
  Administered 2014-08-10: 10 mg via RECTAL
  Filled 2014-08-10: qty 1

## 2014-08-10 MED ORDER — IOHEXOL 300 MG/ML  SOLN
25.0000 mL | INTRAMUSCULAR | Status: AC
  Administered 2014-08-10 (×2): 25 mL via ORAL

## 2014-08-10 NOTE — Progress Notes (Signed)
CARE MANAGEMENT NOTE 08/10/2014  Patient:  Angela Harvey, Angela Harvey   Account Number:  [id number]  Date Initiated:  08/10/2014  Documentation initiated by:  Edwyna Shell  Subjective/Objective Assessment:   64 yo female admitted with acute pancreatitis from home     Action/Plan:   discharge planning   Anticipated DC Date:  08/13/2014   Anticipated DC Plan:  [place]  CM consult      Choice offered to / List presented to:             Status of service:  In process, will continue to follow Medicare Important Message given?   (If response is "NO", the following Medicare IM given date fields will be blank) Date Medicare IM given:   Medicare IM given by:   Date Additional Medicare IM given:   Additional Medicare IM given by:    Discharge Disposition:    Per UR Regulation:    If discussed at Long Length of Stay Meetings, dates discussed:    Comments:  08/10/14 Edwyna Shell RN BSN CM [phone] Patient lives at home with spouse, drives self. She has had [place] services with [facility] in the past. Will continue to follow for any discharge planning needs.

## 2014-08-10 NOTE — Consult Note (Addendum)
Referring Provider: Dr. Reynaldo Minium Primary Care Physician:  Geoffery Lyons, MD Primary Gastroenterologist:  Dr. Cristina Gong  Reason for Consultation:  Acute Pancreatitis  HPI: Angela Harvey is a 64 y.o. female admitted on 08/08/14 with acute pancreatitis having the acute onset of upper quadrant abdominal pain with N/V for the week prior to admission. Lipase 1435 with normal LFTs on admit. Denies previous history of pancreatitis. Denies alcohol. Denies any new meds prior to admit. CT on 08/08/14 and 08/10/14 showing diffuse inflammation consistent with acute pancreatitis. Gallbladder normal on CT. Husband at bedside.   Past Medical History  Diagnosis Date  . Hiatal hernia   . Hypertension   . Diabetes mellitus without complication   Morbid obesity  Past Surgical History  Procedure Laterality Date  . Hernia repair      Prior to Admission medications   Medication Sig Start Date End Date Taking? Authorizing Provider  celecoxib (CELEBREX) 200 MG capsule Take 200 mg by mouth daily.  07/28/14  Yes Historical Provider, MD  KRILL OIL PO Take 1 capsule by mouth daily.   Yes Historical Provider, MD  metFORMIN (GLUCOPHAGE) 500 MG tablet Take 500 mg by mouth 2 (two) times daily. 07/08/14  Yes Historical Provider, MD  metoCLOPramide (REGLAN) 10 MG tablet Take 10 mg by mouth 3 (three) times daily. 07/08/14  Yes Historical Provider, MD  Multiple Vitamin (MULTIVITAMIN WITH MINERALS) TABS tablet Take 1 tablet by mouth daily.   Yes Historical Provider, MD  multivitamin-lutein (OCUVITE-LUTEIN) CAPS capsule Take 1 capsule by mouth daily.   Yes Historical Provider, MD  sertraline (ZOLOFT) 100 MG tablet Take 100 mg by mouth daily. 07/08/14  Yes Historical Provider, MD    Scheduled Meds: . beta carotene w/minerals  1 tablet Oral Daily  . bisacodyl  10 mg Rectal Once  . enoxaparin (LOVENOX) injection  40 mg Subcutaneous Q24H  . insulin aspart  0-5 Units Subcutaneous QHS  . insulin aspart  0-9 Units Subcutaneous  TID WC  . insulin glargine  15 Units Subcutaneous QHS  . metoCLOPramide  10 mg Oral TID WC  . multivitamin with minerals  1 tablet Oral Daily  . pantoprazole (PROTONIX) IV  40 mg Intravenous QHS  . sertraline  100 mg Oral Daily   Continuous Infusions: . 0.9 % NaCl with KCl 40 mEq / L 75 mL/hr (08/10/14 0434)   PRN Meds:.acetaminophen **OR** acetaminophen, alum & mag hydroxide-simeth, HYDROcodone-acetaminophen, morphine injection, ondansetron **OR** ondansetron (ZOFRAN) IV, polyethylene glycol, zolpidem  Allergies as of 08/08/2014  . (No Known Allergies)    History reviewed. No pertinent family history.  History   Social History  . Marital Status: Married    Spouse Name: N/A  . Number of Children: N/A  . Years of Education: N/A   Occupational History  . Not on file.   Social History Main Topics  . Smoking status: Never Smoker   . Smokeless tobacco: Not on file  . Alcohol Use: No  . Drug Use: Not on file  . Sexual Activity: Not on file   Other Topics Concern  . Not on file   Social History Narrative    Review of Systems: All negative from GI standpoint except as stated above in HPI.  Physical Exam: Vital signs: Filed Vitals:   08/10/14 1545  BP: 152/70  Pulse: 108  Temp: 99.8 F (37.7 C)  Resp: 20   Last BM Date: 08/07/14 General:   Lethargic, morbidly obese, no acute distress HEENT: anicteric Lungs:  Clear throughout to  auscultation.   No wheezes, crackles, or rhonchi. No acute distress. Heart:  Regular rate and rhythm; no murmurs, clicks, rubs,  or gallops. Abdomen: epigastric, LUQ, and RUQ tenderness with minimal guarding, soft, nondistended, obese  Rectal:  Deferred Ext: no edema  GI:  Lab Results:  Recent Labs  08/08/14 1235 08/09/14 0522 08/10/14 0450  WBC 19.9* 22.8* 17.9*  HGB 15.5* 14.4 12.3  HCT 43.8 41.2 35.7*  PLT 287 267 226   BMET  Recent Labs  08/08/14 1235 08/09/14 0522 08/10/14 0450  NA 139 141 143  K 2.4* 3.0* 4.3   CL 96 100 104  CO2 28 31 32  GLUCOSE 380* 238* 255*  BUN 17 22 33*  CREATININE 0.87 0.79 1.02  CALCIUM 11.9* 10.8* 9.6   LFT  Recent Labs  08/10/14 0450  PROT 5.5*  ALBUMIN 2.9*  AST 37  ALT 15  ALKPHOS 65  BILITOT 1.4*   Lipase 1435 (08/08/14) 839 (08/09/14) 160 (08/10/14)   Studies/Results: Ct Abdomen W Contrast  08/10/2014   CLINICAL DATA:  Acute pancreatitis.  Abdominal pain, nausea.  EXAM: CT ABDOMEN WITH CONTRAST  TECHNIQUE: Multidetector CT imaging of the abdomen was performed using the standard protocol following bolus administration of intravenous contrast.  CONTRAST:  193mL OMNIPAQUE IOHEXOL 300 MG/ML  SOLN  COMPARISON:  08/08/2014  FINDINGS: Scarring or atelectasis in the lingula and in the lung bases. No effusions. Heart is normal size.  Postoperative changes in the region of the stomach. There is stranding/inflammation noted around the pancreas diffusely compatible with acute pancreatitis. The pancreas appears to be enhancing normally. Amount of peripancreatic inflammation is similar to prior study. Acute fluid collections within the left anterior para renal space has decreased.  Portal vein and splenic vein are patent. Diffuse fatty infiltration of the liver. Spleen, adrenals and kidneys are unremarkable.  Visualized large and small bowel are unremarkable.  IMPRESSION: Continued inflammation/ stranding around the pancreas, similar to prior study. The fluid in the left anterior para renal space has decreased.   Electronically Signed   By: Rolm Baptise M.D.   On: 08/10/2014 15:31   Ct Abdomen Pelvis W Contrast  08/08/2014   ADDENDUM REPORT: 08/08/2014 17:51  ADDENDUM: Typographical error within the impression. The first sentence should read  Edematous appearing pancreas with extensive stranding and fluid around the pancreas and in the left anterior para renal space compatible with acute pancreatitis.   Electronically Signed   By: Rolm Baptise M.D.   On: 08/08/2014 17:51    08/08/2014   CLINICAL DATA:  Generalized abdominal pain beginning last night. Nausea, vomiting.  EXAM: CT ABDOMEN AND PELVIS WITH CONTRAST  TECHNIQUE: Multidetector CT imaging of the abdomen and pelvis was performed using the standard protocol following bolus administration of intravenous contrast.  CONTRAST:  162mL OMNIPAQUE IOHEXOL 300 MG/ML  SOLN  COMPARISON:  12/11/2012  FINDINGS: The distal scratch head the visualized distal esophagus is fluid-filled and dilated, possibly related to reflux. Lung bases are clear. No effusions. Heart is normal size.  Postoperative changes in the stomach. There is stranding noted around the pancreas with fluid in the left anterior para renal space and inferior to the pancreas compatible with acute pancreatitis. Pancreas looks prominent/edematous. No focal abnormality.  Mild diffuse fatty infiltration of the liver. Gallbladder, spleen, adrenals and kidneys are normal. Large and small bowel are unremarkable. There is free fluid in the cul-de-sac of the pelvis. Prior hysterectomy. No adnexal masses. Urinary bladder is unremarkable.  Degenerative changes in  the lower lumbar spine. No acute bony abnormality or focal bone lesion.  IMPRESSION: Edematous appearing pancreas with extensive stranding and fluid around the pancreas and in the left anterior para renal space compatible with acute appendicitis. Dilated, fluid-filled distal esophagus, presumably related to reflux. Postoperative changes in the region of the stomach.  Mild diffuse fatty infiltration of the liver.  Electronically Signed: By: Rolm Baptise M.D. On: 08/08/2014 17:33    Impression/Plan: Acute pancreatitis without any evidence of biliary source. No evidence of necrosis or pseudocysts on CT. I think her pain is related to her pancreatitis and not a peptic ulcer source. Will need an EUS when pancreatitis has resolved completely (4-6 weeks as outpt). MRCP is another option but I am not sure an adequate study could be  obtained due to her body habitus and weakness (doubt she could hold her breath adequately at this time for that test).  If pain persists, then may need an EGD to look for other sources. Continue supportive care. Will change to clear liquid diet tomorrow morning and slowly advance to low fat diet.    LOS: 2 days   Clifton C.  08/10/2014, 4:47 PM

## 2014-08-10 NOTE — Progress Notes (Signed)
Subjective: Claims no better--pain, n/v--no bm  Objective: Vital signs in last 24 hours: Temp:  [98.2 F (36.8 C)-99.3 F (37.4 C)] 99.1 F (37.3 C) (03/01 0500) Pulse Rate:  [81-97] 86 (03/01 0500) Resp:  [18] 18 (03/01 0500) BP: (134-149)/(68-72) 145/71 mmHg (03/01 0500) SpO2:  [92 %-95 %] 92 % (02/29 2146) Weight change:   CBG (last 3)   Recent Labs  08/09/14 1603 08/09/14 2142 08/10/14 0739  GLUCAP 245* 204* 220*    Intake/Output from previous day: 02/29 0701 - 03/01 0700 In: 2000 [I.V.:1800; IV Piggyback:200] Out: 650 [Urine:650]  Physical Exam: Lying on side- no distress, emesis basin close No jvd Chest clear RRR Abdomen soft-mild epigastric tenderness Neuro normal   Lab Results:  Recent Labs  08/08/14 1235 08/09/14 0522 08/10/14 0450  NA 139 141 143  K 2.4* 3.0* 4.3  CL 96 100 104  CO2 28 31 32  GLUCOSE 380* 238* 255*  BUN 17 22 33*  CREATININE 0.87 0.79 1.02  CALCIUM 11.9* 10.8* 9.6  MG 0.8* 1.2*  --     Recent Labs  08/09/14 0522 08/10/14 0450  AST 25 37  ALT 16 15  ALKPHOS 54 65  BILITOT 0.7 1.4*  PROT 6.2 5.5*  ALBUMIN 3.4* 2.9*    Recent Labs  08/08/14 1235 08/09/14 0522 08/10/14 0450  WBC 19.9* 22.8* 17.9*  NEUTROABS 17.7*  --   --   HGB 15.5* 14.4 12.3  HCT 43.8 41.2 35.7*  MCV 88.8 89.0 91.5  PLT 287 267 226   No results found for: INR, PROTIME No results for input(s): CKTOTAL, CKMB, CKMBINDEX, TROPONINI in the last 72 hours. No results for input(s): TSH, T4TOTAL, T3FREE, THYROIDAB in the last 72 hours.  Invalid input(s): FREET3 No results for input(s): VITAMINB12, FOLATE, FERRITIN, TIBC, IRON, RETICCTPCT in the last 72 hours.  Studies/Results: Ct Abdomen Pelvis W Contrast  08/08/2014   ADDENDUM REPORT: 08/08/2014 17:51  ADDENDUM: Typographical error within the impression. The first sentence should read  Edematous appearing pancreas with extensive stranding and fluid around the pancreas and in the left anterior  para renal space compatible with acute pancreatitis.   Electronically Signed   By: Rolm Baptise M.D.   On: 08/08/2014 17:51   08/08/2014   CLINICAL DATA:  Generalized abdominal pain beginning last night. Nausea, vomiting.  EXAM: CT ABDOMEN AND PELVIS WITH CONTRAST  TECHNIQUE: Multidetector CT imaging of the abdomen and pelvis was performed using the standard protocol following bolus administration of intravenous contrast.  CONTRAST:  148mL OMNIPAQUE IOHEXOL 300 MG/ML  SOLN  COMPARISON:  12/11/2012  FINDINGS: The distal scratch head the visualized distal esophagus is fluid-filled and dilated, possibly related to reflux. Lung bases are clear. No effusions. Heart is normal size.  Postoperative changes in the stomach. There is stranding noted around the pancreas with fluid in the left anterior para renal space and inferior to the pancreas compatible with acute pancreatitis. Pancreas looks prominent/edematous. No focal abnormality.  Mild diffuse fatty infiltration of the liver. Gallbladder, spleen, adrenals and kidneys are normal. Large and small bowel are unremarkable. There is free fluid in the cul-de-sac of the pelvis. Prior hysterectomy. No adnexal masses. Urinary bladder is unremarkable.  Degenerative changes in the lower lumbar spine. No acute bony abnormality or focal bone lesion.  IMPRESSION: Edematous appearing pancreas with extensive stranding and fluid around the pancreas and in the left anterior para renal space compatible with acute appendicitis. Dilated, fluid-filled distal esophagus, presumably related to reflux. Postoperative changes  in the region of the stomach.  Mild diffuse fatty infiltration of the liver.  Electronically Signed: By: Rolm Baptise M.D. On: 08/08/2014 17:33     Assessment/Plan:  1. Acute pancreatitis- pain improved, lipase dropping- bowel rest, ivf, ppi- given protracted sxs despite metabolic improvement and given altered anatomy from prior bariatrics--gi consult, repeat ct  abdomen 2. DM2- no complications-will hold metformin 3. HTN- stable 4. Pyuria--send urine culture-empiric cipro given wbc   LOS: 2 days   Darleen Moffitt A 08/10/2014, 11:00 AM

## 2014-08-11 LAB — GLUCOSE, CAPILLARY
Glucose-Capillary: 252 mg/dL — ABNORMAL HIGH (ref 70–99)
Glucose-Capillary: 214 mg/dL — ABNORMAL HIGH (ref 70–99)
Glucose-Capillary: 238 mg/dL — ABNORMAL HIGH (ref 70–99)
Glucose-Capillary: 246 mg/dL — ABNORMAL HIGH (ref 70–99)

## 2014-08-11 LAB — COMPREHENSIVE METABOLIC PANEL
ALT: 24 U/L (ref 0–35)
AST: 31 U/L (ref 0–37)
Albumin: 2.8 g/dL — ABNORMAL LOW (ref 3.5–5.2)
Alkaline Phosphatase: 88 U/L (ref 39–117)
Anion gap: 8 (ref 5–15)
BUN: 29 mg/dL — ABNORMAL HIGH (ref 6–23)
CO2: 24 mmol/L (ref 19–32)
Calcium: 8.9 mg/dL (ref 8.4–10.5)
Chloride: 108 mmol/L (ref 96–112)
Creatinine, Ser: 0.89 mg/dL (ref 0.50–1.10)
GFR calc Af Amer: 77 mL/min — ABNORMAL LOW (ref >90)
GFR calc non Af Amer: 67 mL/min — ABNORMAL LOW (ref >90)
Glucose, Bld: 252 mg/dL — ABNORMAL HIGH (ref 70–99)
Potassium: 4.2 mmol/L (ref 3.5–5.1)
Sodium: 140 mmol/L (ref 135–145)
Total Bilirubin: 1 mg/dL (ref 0.3–1.2)
Total Protein: 5.6 g/dL — ABNORMAL LOW (ref 6.0–8.3)

## 2014-08-11 LAB — URINE CULTURE: Colony Count: >=100000

## 2014-08-11 LAB — CBC
HCT: 32.2 % — ABNORMAL LOW (ref 36.0–46.0)
Hemoglobin: 11 g/dL — ABNORMAL LOW (ref 12.0–15.0)
MCH: 31.6 pg (ref 26.0–34.0)
MCHC: 34.2 g/dL (ref 30.0–36.0)
MCV: 92.5 fL (ref 78.0–100.0)
Platelets: UNDETERMINED 10*3/uL (ref 150–400)
RBC: 3.48 MIL/uL — ABNORMAL LOW (ref 3.87–5.11)
RDW: 14.2 % (ref 11.5–15.5)
WBC: 13.2 10*3/uL — ABNORMAL HIGH (ref 4.0–10.5)

## 2014-08-11 NOTE — Progress Notes (Signed)
Angela Harvey 10:00 AM  Subjective: Patient with recent diagnosis of pancreatitis and her case was discussed with my partner the patient and her husband and her hospital computer chart was reviewed and she is currently doing better with less pain and tolerating clear liquids with less nausea and vomiting and her family history is negative for any pancreatitis or autoimmune problems and she has no new complaints  Objective: Vital signs stable afebrile no acute distress abdomen is soft nontender good bowel sounds labs are pertinent for decreased white count slightly decreased hemoglobin decreased lipase BUN stable  Assessment: Pancreatitis questionable etiology  Plan: If doing this well tomorrow may try to advance diet slowly and consider adding pancreatic enzymes and agree with when necessary MRCP or EUS or if she does well waiting 4-6 weeks for inflammation to decrease and I am happy to see back as an outpatient when necessary or she can go back to her [place] gastro[place]ologist if she would like  [facility] E

## 2014-08-11 NOTE — Progress Notes (Signed)
Inpatient Diabetes Program Recommendations  AACE/ADA: New Consensus Statement on Inpatient Glycemic Control (2013)  Target Ranges:  Prepandial:   less than 140 mg/dL      Peak postprandial:   less than 180 mg/dL (1-2 hours)      Critically ill patients:  140 - 180 mg/dL    Results for Angela Harvey, Angela Harvey (MRN [id number]) as of 08/11/2014 10:03  Ref. Range 08/10/2014 07:39 08/10/2014 12:14 08/10/2014 16:36 08/10/2014 21:15  Glucose-Capillary Latest Range: 70-99 mg/dL 220 (H) 231 (H) 254 (H) 229 (H)    Results for Angela Harvey, Angela Harvey (MRN [id number]) as of 08/11/2014 10:03  Ref. Range 08/11/2014 07:31  Glucose-Capillary Latest Range: 70-99 mg/dL 252 (H)    Results for Angela Harvey, Angela Harvey (MRN [id number]) as of 08/11/2014 10:03  Ref. Range 08/08/2014 13:33  Hemoglobin A1C Latest Range: 4.8-5.6 % 6.7 (H)     Admit w/ Pancreatitis. +DM, HTN  Home DM Meds: Metformin 500 mg bid   Current Insulin Orders: Lantus 15 units QHS     Novolog Sensitive SSI tid ac + HS   **A1c shows decent glucose control at home.    **Patient having consistent glucose elevations here in the hospital setting.    MD- Please consider the following:  1. Increase Lantus further to 18 units QHS (0.2 units/kg dosing)  2. Increase Novolog SSI to Moderate scale (currently ordered as Sensitive scale)     Will follow Wyn Quaker RN, MSN, CDE Diabetes Coordinator Inpatient Diabetes Program Team Pager: [phone] (8a-10p)

## 2014-08-11 NOTE — Progress Notes (Signed)
Subjective: Claims some better  Objective: Vital signs in last 24 hours: Temp:  [98.9 F (37.2 C)-99.8 F (37.7 C)] 99 F (37.2 C) (03/02 0602) Pulse Rate:  [92-108] 95 (03/02 0602) Resp:  [18-20] 18 (03/02 0602) BP: (136-152)/(67-84) 136/84 mmHg (03/02 0602) SpO2:  [97 %-98 %] 97 % (03/02 0602) Weight change:   CBG (last 3)   Recent Labs  08/10/14 1636 08/10/14 2115 08/11/14 0731  GLUCAP 254* 229* 252*    Intake/Output from previous day: 03/01 0701 - 03/02 0700 In: 1875 [I.V.:1875] Out: 925 [Urine:925]  Physical Exam:  Lying on side- no distress, emesis basin close No jvd Chest clear RRR Abdomen soft-mild epigastric tenderness Neuro normal  Lab Results:  Recent Labs  08/08/14 1235 08/09/14 0522 08/10/14 0450 08/11/14 0515  NA 139 141 143 140  K 2.4* 3.0* 4.3 4.2  CL 96 100 104 108  CO2 28 31 32 24  GLUCOSE 380* 238* 255* 252*  BUN 17 22 33* 29*  CREATININE 0.87 0.79 1.02 0.89  CALCIUM 11.9* 10.8* 9.6 8.9  MG 0.8* 1.2*  --   --     Recent Labs  08/10/14 0450 08/11/14 0515  AST 37 31  ALT 15 24  ALKPHOS 65 88  BILITOT 1.4* 1.0  PROT 5.5* 5.6*  ALBUMIN 2.9* 2.8*    Recent Labs  08/08/14 1235  08/10/14 0450 08/11/14 0515  WBC 19.9*  < > 17.9* 13.2*  NEUTROABS 17.7*  --   --   --   HGB 15.5*  < > 12.3 11.0*  HCT 43.8  < > 35.7* 32.2*  MCV 88.8  < > 91.5 92.5  PLT 287  < > 226 PLATELET CLUMPS NOTED ON SMEAR, UNABLE TO ESTIMATE  < > = values in this interval not displayed. No results found for: INR, PROTIME No results for input(s): CKTOTAL, CKMB, CKMBINDEX, TROPONINI in the last 72 hours. No results for input(s): TSH, T4TOTAL, T3FREE, THYROIDAB in the last 72 hours.  Invalid input(s): FREET3 No results for input(s): VITAMINB12, FOLATE, FERRITIN, TIBC, IRON, RETICCTPCT in the last 72 hours.  Studies/Results: Ct Abdomen W Contrast  08/10/2014   CLINICAL DATA:  Acute pancreatitis.  Abdominal pain, nausea.  EXAM: CT ABDOMEN WITH CONTRAST   TECHNIQUE: Multidetector CT imaging of the abdomen was performed using the standard protocol following bolus administration of intravenous contrast.  CONTRAST:  139mL OMNIPAQUE IOHEXOL 300 MG/ML  SOLN  COMPARISON:  08/08/2014  FINDINGS: Scarring or atelectasis in the lingula and in the lung bases. No effusions. Heart is normal size.  Postoperative changes in the region of the stomach. There is stranding/inflammation noted around the pancreas diffusely compatible with acute pancreatitis. The pancreas appears to be enhancing normally. Amount of peripancreatic inflammation is similar to prior study. Acute fluid collections within the left anterior para renal space has decreased.  Portal vein and splenic vein are patent. Diffuse fatty infiltration of the liver. Spleen, adrenals and kidneys are unremarkable.  Visualized large and small bowel are unremarkable.  IMPRESSION: Continued inflammation/ stranding around the pancreas, similar to prior study. The fluid in the left anterior para renal space has decreased.   Electronically Signed   By: Rolm Baptise M.D.   On: 08/10/2014 15:31     Assessment/Plan:  1. Acute pancreatitis- clinically sl;owly improving 2. DM2- no complications-will hold metformin 3. HTN- stable 4. Pyuria--empiric cipro   LOS: 3 days   Lidya Mccalister A 08/11/2014, 7:53 AM

## 2014-08-12 LAB — COMPREHENSIVE METABOLIC PANEL
ALT: 43 U/L — ABNORMAL HIGH (ref 0–35)
AST: 72 U/L — ABNORMAL HIGH (ref 0–37)
Albumin: 2.9 g/dL — ABNORMAL LOW (ref 3.5–5.2)
Alkaline Phosphatase: 119 U/L — ABNORMAL HIGH (ref 39–117)
Anion gap: 8 (ref 5–15)
BUN: 21 mg/dL (ref 6–23)
CO2: 24 mmol/L (ref 19–32)
Calcium: 8.3 mg/dL — ABNORMAL LOW (ref 8.4–10.5)
Chloride: 106 mmol/L (ref 96–112)
Creatinine, Ser: 0.69 mg/dL (ref 0.50–1.10)
GFR calc Af Amer: >90 mL/min (ref >90)
GFR calc non Af Amer: 89 mL/min — ABNORMAL LOW (ref >90)
Glucose, Bld: 232 mg/dL — ABNORMAL HIGH (ref 70–99)
Potassium: 4.4 mmol/L (ref 3.5–5.1)
Sodium: 138 mmol/L (ref 135–145)
Total Bilirubin: 0.7 mg/dL (ref 0.3–1.2)
Total Protein: 6 g/dL (ref 6.0–8.3)

## 2014-08-12 LAB — CBC
HCT: 30.2 % — ABNORMAL LOW (ref 36.0–46.0)
Hemoglobin: 10.2 g/dL — ABNORMAL LOW (ref 12.0–15.0)
MCH: 31 pg (ref 26.0–34.0)
MCHC: 33.8 g/dL (ref 30.0–36.0)
MCV: 91.8 fL (ref 78.0–100.0)
Platelets: 232 10*3/uL (ref 150–400)
RBC: 3.29 MIL/uL — ABNORMAL LOW (ref 3.87–5.11)
RDW: 14 % (ref 11.5–15.5)
WBC: 12.9 10*3/uL — ABNORMAL HIGH (ref 4.0–10.5)

## 2014-08-12 LAB — GLUCOSE, CAPILLARY
Glucose-Capillary: 201 mg/dL — ABNORMAL HIGH (ref 70–99)
Glucose-Capillary: 207 mg/dL — ABNORMAL HIGH (ref 70–99)
Glucose-Capillary: 151 mg/dL — ABNORMAL HIGH (ref 70–99)
Glucose-Capillary: 156 mg/dL — ABNORMAL HIGH (ref 70–99)

## 2014-08-12 MED ORDER — PANCRELIPASE (LIP-PROT-AMYL) 12000-38000 UNITS PO CPEP
36000.0000 [IU] | ORAL_CAPSULE | Freq: Three times a day (TID) | ORAL | Status: DC
  Administered 2014-08-12 (×2): 36000 [IU] via ORAL
  Filled 2014-08-12 (×6): qty 3

## 2014-08-12 NOTE — Progress Notes (Signed)
Subjective: Obviously must have some conflicting stories as unlike the GI progress note this morning patient relates she continues with nausea and vomiting as well as pain minimal improvement since yesterday. She points on the tray to be clear liquids which she claims she is not tolerated.  Objective: Vital signs in last 24 hours: Temp:  [98.9 F (37.2 C)-99.9 F (37.7 C)] 98.9 F (37.2 C) (03/03 1000) Pulse Rate:  [82-101] 82 (03/03 1000) Resp:  [18-20] 18 (03/03 1000) BP: (117-176)/(77-89) 176/86 mmHg (03/03 1000) SpO2:  [97 %-100 %] 97 % (03/03 1000) Weight change:   CBG (last 3)   Recent Labs  08/11/14 1632 08/11/14 2216 08/12/14 0732  GLUCAP 238* 246* 201*    Intake/Output from previous day: 03/02 0701 - 03/03 0700 In: 720 [P.O.:720] Out: 1300 [Urine:1300]  Physical Exam: Sitting up on the edge of the bed no apparent distress Chest is clear Abdomen obese soft good bowel sounds minimal epigastric tenderness No peripheral edema Neurologic exam is normal   Lab Results:  Recent Labs  08/11/14 0515 08/12/14 0521  NA 140 138  K 4.2 4.4  CL 108 106  CO2 24 24  GLUCOSE 252* 232*  BUN 29* 21  CREATININE 0.89 0.69  CALCIUM 8.9 8.3*    Recent Labs  08/11/14 0515 08/12/14 0521  AST 31 72*  ALT 24 43*  ALKPHOS 88 119*  BILITOT 1.0 0.7  PROT 5.6* 6.0  ALBUMIN 2.8* 2.9*    Recent Labs  08/11/14 0515 08/12/14 0521  WBC 13.2* 12.9*  HGB 11.0* 10.2*  HCT 32.2* 30.2*  MCV 92.5 91.8  PLT PLATELET CLUMPS NOTED ON SMEAR, UNABLE TO ESTIMATE 232   No results found for: INR, PROTIME No results for input(s): CKTOTAL, CKMB, CKMBINDEX, TROPONINI in the last 72 hours. No results for input(s): TSH, T4TOTAL, T3FREE, THYROIDAB in the last 72 hours.  Invalid input(s): FREET3 No results for input(s): VITAMINB12, FOLATE, FERRITIN, TIBC, IRON, RETICCTPCT in the last 72 hours.  Studies/Results: Ct Abdomen W Contrast  08/10/2014   CLINICAL DATA:  Acute pancreatitis.   Abdominal pain, nausea.  EXAM: CT ABDOMEN WITH CONTRAST  TECHNIQUE: Multidetector CT imaging of the abdomen was performed using the standard protocol following bolus administration of intravenous contrast.  CONTRAST:  120mL OMNIPAQUE IOHEXOL 300 MG/ML  SOLN  COMPARISON:  08/08/2014  FINDINGS: Scarring or atelectasis in the lingula and in the lung bases. No effusions. Heart is normal size.  Postoperative changes in the region of the stomach. There is stranding/inflammation noted around the pancreas diffusely compatible with acute pancreatitis. The pancreas appears to be enhancing normally. Amount of peripancreatic inflammation is similar to prior study. Acute fluid collections within the left anterior para renal space has decreased.  Portal vein and splenic vein are patent. Diffuse fatty infiltration of the liver. Spleen, adrenals and kidneys are unremarkable.  Visualized large and small bowel are unremarkable.  IMPRESSION: Continued inflammation/ stranding around the pancreas, similar to prior study. The fluid in the left anterior para renal space has decreased.   Electronically Signed   By: Rolm Baptise M.D.   On: 08/10/2014 15:31     Assessment/Plan: 1. Acute pancreatitis- clinically sl;owly improving 2. DM2- no complications-will hold metformin 3. HTN- stable 4. Pyuria--empiric cipro   LOS: 3 days *   LOS: 4 days   Gardner Servantes A 08/12/2014, 10:45 AM

## 2014-08-12 NOTE — Progress Notes (Signed)
Angela Harvey 11:01 AM  Subjective: Patient about the same as yesterday but still having some pain and nausea and vomiting and not tolerating clear liquids very well but no new complaints  Objective: Vital signs stable afebrile no acute distress abdomen is soft rare bowel sounds no guarding or rebound minimal upper discomfort white count slightly decreased slightly decreased hemoglobin other labs stable  Assessment: Pancreatitis questionable etiology  Plan: Will add pancreatic enzymes and check a lipase tomorrow and if no better in a day or 2 will need to start either nasal jejunal feeds or TPN and both were discussed with the patient  [facility] E

## 2014-08-13 LAB — CBC
HCT: 27.9 % — ABNORMAL LOW (ref 36.0–46.0)
Hemoglobin: 9.7 g/dL — ABNORMAL LOW (ref 12.0–15.0)
MCH: 31.6 pg (ref 26.0–34.0)
MCHC: 34.8 g/dL (ref 30.0–36.0)
MCV: 90.9 fL (ref 78.0–100.0)
Platelets: 267 10*3/uL (ref 150–400)
RBC: 3.07 MIL/uL — ABNORMAL LOW (ref 3.87–5.11)
RDW: 14 % (ref 11.5–15.5)
WBC: 10.5 10*3/uL (ref 4.0–10.5)

## 2014-08-13 LAB — GLUCOSE, CAPILLARY
Glucose-Capillary: 155 mg/dL — ABNORMAL HIGH (ref 70–99)
Glucose-Capillary: 166 mg/dL — ABNORMAL HIGH (ref 70–99)
Glucose-Capillary: 307 mg/dL — ABNORMAL HIGH (ref 70–99)
Glucose-Capillary: 246 mg/dL — ABNORMAL HIGH (ref 70–99)

## 2014-08-13 LAB — COMPREHENSIVE METABOLIC PANEL
ALT: 44 U/L — ABNORMAL HIGH (ref 0–35)
AST: 52 U/L — ABNORMAL HIGH (ref 0–37)
Albumin: 2.5 g/dL — ABNORMAL LOW (ref 3.5–5.2)
Alkaline Phosphatase: 115 U/L (ref 39–117)
Anion gap: 8 (ref 5–15)
BUN: 14 mg/dL (ref 6–23)
CO2: 22 mmol/L (ref 19–32)
Calcium: 7.9 mg/dL — ABNORMAL LOW (ref 8.4–10.5)
Chloride: 109 mmol/L (ref 96–112)
Creatinine, Ser: 0.6 mg/dL (ref 0.50–1.10)
GFR calc Af Amer: >90 mL/min (ref >90)
GFR calc non Af Amer: >90 mL/min (ref >90)
Glucose, Bld: 172 mg/dL — ABNORMAL HIGH (ref 70–99)
Potassium: 3.9 mmol/L (ref 3.5–5.1)
Sodium: 139 mmol/L (ref 135–145)
Total Bilirubin: 0.5 mg/dL (ref 0.3–1.2)
Total Protein: 5.3 g/dL — ABNORMAL LOW (ref 6.0–8.3)

## 2014-08-13 LAB — LIPASE, BLOOD: Lipase: 72 U/L — ABNORMAL HIGH (ref 11–59)

## 2014-08-13 MED ORDER — PANTOPRAZOLE SODIUM 40 MG PO TBEC
40.0000 mg | DELAYED_RELEASE_TABLET | Freq: Every day | ORAL | Status: DC
  Administered 2014-08-13 – 2014-08-16 (×4): 40 mg via ORAL
  Filled 2014-08-13 (×5): qty 1

## 2014-08-13 MED ORDER — PANCRELIPASE (LIP-PROT-AMYL) 36000-114000 UNITS PO CPEP
36000.0000 [IU] | ORAL_CAPSULE | Freq: Three times a day (TID) | ORAL | Status: DC
  Administered 2014-08-13 – 2014-08-15 (×6): 36000 [IU] via ORAL
  Filled 2014-08-13 (×11): qty 1

## 2014-08-13 NOTE — Progress Notes (Signed)
Subjective: Finally better-no pain, no n/v  Objective: Vital signs in last 24 hours: Temp:  [98.2 F (36.8 C)-98.9 F (37.2 C)] 98.2 F (36.8 C) (03/04 0547) Pulse Rate:  [77-91] 77 (03/04 0547) Resp:  [16-18] 18 (03/04 0547) BP: (149-176)/(77-87) 173/82 mmHg (03/04 0547) SpO2:  [97 %-100 %] 100 % (03/04 0547) Weight change:   CBG (last 3)   Recent Labs  08/12/14 1722 08/12/14 2202 08/13/14 0723  GLUCAP 151* 156* 155*    Intake/Output from previous day: 03/03 0701 - 03/04 0700 In: 3640 [P.O.:40; I.V.:3600] Out: 950 [Urine:950]  Physical Exam: In bed, rested, looks good, nontoxic No jvd Chest clear rrr-no murmur abd-soft, nontender, obese Calves nontender Intact pulses, no edema   Lab Results:  Recent Labs  08/12/14 0521 08/13/14 0510  NA 138 139  K 4.4 3.9  CL 106 109  CO2 24 22  GLUCOSE 232* 172*  BUN 21 14  CREATININE 0.69 0.60  CALCIUM 8.3* 7.9*    Recent Labs  08/12/14 0521 08/13/14 0510  AST 72* 52*  ALT 43* 44*  ALKPHOS 119* 115  BILITOT 0.7 0.5  PROT 6.0 5.3*  ALBUMIN 2.9* 2.5*    Recent Labs  08/12/14 0521 08/13/14 0510  WBC 12.9* 10.5  HGB 10.2* 9.7*  HCT 30.2* 27.9*  MCV 91.8 90.9  PLT 232 267   No results found for: INR, PROTIME No results for input(s): CKTOTAL, CKMB, CKMBINDEX, TROPONINI in the last 72 hours. No results for input(s): TSH, T4TOTAL, T3FREE, THYROIDAB in the last 72 hours.  Invalid input(s): FREET3 No results for input(s): VITAMINB12, FOLATE, FERRITIN, TIBC, IRON, RETICCTPCT in the last 72 hours.  Studies/Results: No results found.   Assessment/Plan: 1. Acute pancreatitis- much improved, advance diet 2. dm2- cbg stable 3. htn- stable 4. uti- day 5 cipro  Mobilize, advance diet, home am   LOS: 5 days   Angela Harvey A 08/13/2014, 7:58 AM

## 2014-08-13 NOTE — Progress Notes (Signed)
Angela Harvey 9:29 AM  Subjective: The patient is doing better and has no new complaints  Objective: Vital signs stable afebrile abdomen is soft nontender labs improved  Assessment: Pancreatitis question will etiology  Plan: Agree with slowly advancing diet continue pancreatic enzymes hopefully home soon and GI follow-up in a few weeks to set up outpatient probable EUS versus MRCP  [facility] E

## 2014-08-14 ENCOUNTER — Inpatient Hospital Stay (HOSPITAL_COMMUNITY): Payer: Medicare Other

## 2014-08-14 DIAGNOSIS — E119 Type 2 diabetes mellitus without complications: Secondary | ICD-10-CM

## 2014-08-14 DIAGNOSIS — K219 Gastro-esophageal reflux disease without esophagitis: Secondary | ICD-10-CM | POA: Diagnosis present

## 2014-08-14 DIAGNOSIS — I10 Essential (primary) hypertension: Secondary | ICD-10-CM | POA: Diagnosis present

## 2014-08-14 LAB — COMPREHENSIVE METABOLIC PANEL
ALT: 45 U/L — ABNORMAL HIGH (ref 0–35)
AST: 44 U/L — ABNORMAL HIGH (ref 0–37)
Albumin: 2.5 g/dL — ABNORMAL LOW (ref 3.5–5.2)
Alkaline Phosphatase: 140 U/L — ABNORMAL HIGH (ref 39–117)
Anion gap: 5 (ref 5–15)
BUN: 14 mg/dL (ref 6–23)
CO2: 24 mmol/L (ref 19–32)
Calcium: 8 mg/dL — ABNORMAL LOW (ref 8.4–10.5)
Chloride: 107 mmol/L (ref 96–112)
Creatinine, Ser: 0.57 mg/dL (ref 0.50–1.10)
GFR calc Af Amer: >90 mL/min (ref >90)
GFR calc non Af Amer: >90 mL/min (ref >90)
Glucose, Bld: 236 mg/dL — ABNORMAL HIGH (ref 70–99)
Potassium: 4 mmol/L (ref 3.5–5.1)
Sodium: 136 mmol/L (ref 135–145)
Total Bilirubin: 0.8 mg/dL (ref 0.3–1.2)
Total Protein: 5.7 g/dL — ABNORMAL LOW (ref 6.0–8.3)

## 2014-08-14 LAB — GLUCOSE, CAPILLARY
Glucose-Capillary: 199 mg/dL — ABNORMAL HIGH (ref 70–99)
Glucose-Capillary: 181 mg/dL — ABNORMAL HIGH (ref 70–99)
Glucose-Capillary: 216 mg/dL — ABNORMAL HIGH (ref 70–99)
Glucose-Capillary: 191 mg/dL — ABNORMAL HIGH (ref 70–99)

## 2014-08-14 LAB — CBC
HCT: 28.7 % — ABNORMAL LOW (ref 36.0–46.0)
Hemoglobin: 9.9 g/dL — ABNORMAL LOW (ref 12.0–15.0)
MCH: 31.1 pg (ref 26.0–34.0)
MCHC: 34.5 g/dL (ref 30.0–36.0)
MCV: 90.3 fL (ref 78.0–100.0)
Platelets: 296 10*3/uL (ref 150–400)
RBC: 3.18 MIL/uL — ABNORMAL LOW (ref 3.87–5.11)
RDW: 13.9 % (ref 11.5–15.5)
WBC: 12 10*3/uL — ABNORMAL HIGH (ref 4.0–10.5)

## 2014-08-14 LAB — LIPASE, BLOOD: Lipase: 91 U/L — ABNORMAL HIGH (ref 11–59)

## 2014-08-14 MED ORDER — SODIUM CHLORIDE 0.9 % IV SOLN
INTRAVENOUS | Status: DC
  Administered 2014-08-14 (×2): via INTRAVENOUS

## 2014-08-14 MED ORDER — ENOXAPARIN SODIUM 40 MG/0.4ML ~~LOC~~ SOLN
40.0000 mg | SUBCUTANEOUS | Status: DC
  Administered 2014-08-14 – 2014-08-15 (×2): 40 mg via SUBCUTANEOUS
  Filled 2014-08-14 (×4): qty 0.4

## 2014-08-14 MED ORDER — HYDRALAZINE HCL 20 MG/ML IJ SOLN: 10.0000 mg | Freq: Four times a day (QID) | INTRAMUSCULAR | Status: DC | PRN

## 2014-08-14 NOTE — Progress Notes (Signed)
Subjective: Feels "awful"- persistent nausea/vomitting overnight, epigastric pain and diffuse aching.  No BM.  Not tolerating full liquids.  Objective: Vital signs in last 24 hours: Temp:  [98.1 F (36.7 C)-98.6 F (37 C)] 98.1 F (36.7 C) (03/05 0536) Pulse Rate:  [67-89] 71 (03/05 0536) Resp:  [18] 18 (03/05 0536) BP: (156-192)/(64-99) 156/91 mmHg (03/05 0536) SpO2:  [96 %-100 %] 100 % (03/05 0536) Weight change:  Last BM Date: 08/11/14  CBG (last 3)   Recent Labs  08/13/14 1148 08/13/14 1650 08/13/14 2116  GLUCAP 166* 307* 246*    Intake/Output from previous day: 03/04 0701 - 03/05 0700 In: 240 [P.O.:240] Out: 200 [Urine:200] Intake/Output this shift:    General appearance: appears fatigued, no apparent distress Eyes: no scleral icterus Throat: oropharynx moist without erythema Resp: clear to auscultation bilaterally Cardio: regular rate and rhythm GI: soft, normal bowel sounds; diffuse tenderness most prominent in epigastrium; Negative Murphy's sign; no rebound or guarding Extremities: no clubbing, cyanosis or edema   Lab Results:  Recent Labs  08/12/14 0521 08/13/14 0510  NA 138 139  K 4.4 3.9  CL 106 109  CO2 24 22  GLUCOSE 232* 172*  BUN 21 14  CREATININE 0.69 0.60  CALCIUM 8.3* 7.9*    Recent Labs  08/12/14 0521 08/13/14 0510  AST 72* 52*  ALT 43* 44*  ALKPHOS 119* 115  BILITOT 0.7 0.5  PROT 6.0 5.3*  ALBUMIN 2.9* 2.5*    Recent Labs  08/12/14 0521 08/13/14 0510  WBC 12.9* 10.5  HGB 10.2* 9.7*  HCT 30.2* 27.9*  MCV 91.8 90.9  PLT 232 267   No results found for: INR, PROTIME No results for input(s): CKTOTAL, CKMB, CKMBINDEX, TROPONINI in the last 72 hours. No results for input(s): TSH, T4TOTAL, T3FREE, THYROIDAB in the last 72 hours.  Invalid input(s): FREET3 No results for input(s): VITAMINB12, FOLATE, FERRITIN, TIBC, IRON, RETICCTPCT in the last 72 hours.  Studies/Results: No results found.   Medications:  Scheduled: . beta carotene w/minerals  1 tablet Oral Daily  . insulin aspart  0-5 Units Subcutaneous QHS  . insulin aspart  0-9 Units Subcutaneous TID WC  . insulin glargine  15 Units Subcutaneous QHS  . lipase/protease/amylase  36,000 Units Oral TID AC  . multivitamin with minerals  1 tablet Oral Daily  . pantoprazole  40 mg Oral Q0600  . sertraline  100 mg Oral Daily   Continuous:   Assessment/Plan: Active Problems: 1.  Acute pancreatitis- recurrent N/V and increased abdominal pain with advancing diet.  Will repeat CBC, CMET, and Lipase.  Obtain RUQ ultrasound.  NPO for ultrasound and then resume liquid diet.   2. Essential hypertension- BP elevated- will consider ACE inhibitor on discharge when other issues are stable.  Hydralazine prn in hospital. 3. Diabetes mellitus, type II- adequate control on Lantus/SSI.  Metformin held. 4. GERD (gastroesophageal reflux disease)- continue Protonix 5. Disposition- discharge delayed by recurrent symptoms- anticipate discharge in 48-72 hours if symptoms improve.    LOS: 6 days   Marton Redwood 08/14/2014, 7:23 AM

## 2014-08-14 NOTE — Consult Note (Signed)
Reason for Consult:  Acute pancreatitis Referring Physician: Dr. Harlene Salts is an 64 y.o. female.  HPI: she was admitted 1 week ago with acute pancreatitis. For a week prior to that, she had abdominal pain with nausea and vomiting.  She presented to the hospital and was noted to have a significantly elevated lipase as well as a CT scan that demonstrated significant peripancreatic inflammatory changes.  Since being in the hospital her lipase is improved but every time she tries to eat something she has recurrence of her pain with nausea and vomiting. Liver function tests increased and are still above normal now with an AST of 44, ALT of 45, and alkaline phosphatase of 140. Lipase was 91 today. White cell count was 12,000. An ultrasound performed today demonstrated multiple small gallstones and sludge. It is for this reason that we have been asked to see her.  Past Medical History  Diagnosis Date  . Hiatal hernia   . Hypertension   . Diabetes mellitus without complication     Past Surgical History  Procedure Laterality Date  . Hernia repair        Gastric bypass  History reviewed. No pertinent family history.  Social History:  reports that she has never smoked. She does not have any smokeless tobacco history on file. She reports that she does not drink alcohol. Her drug history is not on file.  Allergies: No Known Allergies   Current facility-administered medications:  .  0.9 %  sodium chloride infusion, , Intravenous, Continuous, Marton Redwood, MD, Last Rate: 75 mL/hr at 08/14/14 7672 .  acetaminophen (TYLENOL) tablet 650 mg, 650 mg, Oral, Q6H PRN **OR** acetaminophen (TYLENOL) suppository 650 mg, 650 mg, Rectal, Q6H PRN, Haywood Pao, MD .  alum & mag hydroxide-simeth (MAALOX/MYLANTA) 200-200-20 MG/5ML suspension 30 mL, 30 mL, Oral, Q6H PRN, Haywood Pao, MD, 30 mL at 08/13/14 2141 .  beta carotene w/minerals (OCUVITE) tablet 1 tablet, 1 tablet, Oral, Daily, Haywood Pao, MD, 1 tablet at 08/14/14 1140 .  enoxaparin (LOVENOX) injection 40 mg, 40 mg, Subcutaneous, Q24H, Marton Redwood, MD, 40 mg at 08/14/14 1140 .  hydrALAZINE (APRESOLINE) injection 10 mg, 10 mg, Intravenous, Q6H PRN, Marton Redwood, MD .  HYDROcodone-acetaminophen (NORCO/VICODIN) 5-325 MG per tablet 1-2 tablet, 1-2 tablet, Oral, Q4H PRN, Haywood Pao, MD, 2 tablet at 08/14/14 1659 .  insulin aspart (novoLOG) injection 0-5 Units, 0-5 Units, Subcutaneous, QHS, Haywood Pao, MD, 2 Units at 08/13/14 2136 .  insulin aspart (novoLOG) injection 0-9 Units, 0-9 Units, Subcutaneous, TID WC, Haywood Pao, MD, 3 Units at 08/14/14 1656 .  insulin glargine (LANTUS) injection 15 Units, 15 Units, Subcutaneous, QHS, Geoffery Lyons, MD, 15 Units at 08/13/14 2137 .  lipase/protease/amylase (CREON) capsule 36,000 Units, 36,000 Units, Oral, TID AC, Geoffery Lyons, MD, 36,000 Units at 08/14/14 1656 .  multivitamin with minerals tablet 1 tablet, 1 tablet, Oral, Daily, Haywood Pao, MD, 1 tablet at 08/14/14 1140 .  ondansetron (ZOFRAN) tablet 4 mg, 4 mg, Oral, Q6H PRN, 4 mg at 08/13/14 1657 **OR** ondansetron (ZOFRAN) injection 4 mg, 4 mg, Intravenous, Q6H PRN, Haywood Pao, MD, 4 mg at 08/12/14 1109 .  pantoprazole (PROTONIX) EC tablet 40 mg, 40 mg, Oral, Q0600, Geoffery Lyons, MD, 40 mg at 08/14/14 0548 .  polyethylene glycol (MIRALAX / GLYCOLAX) packet 17 g, 17 g, Oral, Daily PRN, Haywood Pao, MD .  sertraline (ZOLOFT) tablet 100 mg, 100 mg,  Oral, Daily, Haywood Pao, MD, 100 mg at 08/14/14 1140 .  zolpidem (AMBIEN) tablet 5 mg, 5 mg, Oral, QHS PRN, Haywood Pao, MD, 5 mg at 08/13/14 2138   Results for orders placed or performed during the hospital encounter of 08/08/14 (from the past 48 hour(s))  Glucose, capillary     Status: Abnormal   Collection Time: 08/12/14 10:02 PM  Result Value Ref Range   Glucose-Capillary 156 (H) 70 - 99 mg/dL   Comment 1 Notify RN    CBC     Status: Abnormal   Collection Time: 08/13/14  5:10 AM  Result Value Ref Range   WBC 10.5 4.0 - 10.5 K/uL   RBC 3.07 (L) 3.87 - 5.11 MIL/uL   Hemoglobin 9.7 (L) 12.0 - 15.0 g/dL   HCT 27.9 (L) 36.0 - 46.0 %   MCV 90.9 78.0 - 100.0 fL   MCH 31.6 26.0 - 34.0 pg   MCHC 34.8 30.0 - 36.0 g/dL   RDW 14.0 11.5 - 15.5 %   Platelets 267 150 - 400 K/uL  Comprehensive metabolic panel     Status: Abnormal   Collection Time: 08/13/14  5:10 AM  Result Value Ref Range   Sodium 139 135 - 145 mmol/L   Potassium 3.9 3.5 - 5.1 mmol/L   Chloride 109 96 - 112 mmol/L   CO2 22 19 - 32 mmol/L   Glucose, Bld 172 (H) 70 - 99 mg/dL   BUN 14 6 - 23 mg/dL   Creatinine, Ser 0.60 0.50 - 1.10 mg/dL   Calcium 7.9 (L) 8.4 - 10.5 mg/dL   Total Protein 5.3 (L) 6.0 - 8.3 g/dL   Albumin 2.5 (L) 3.5 - 5.2 g/dL   AST 52 (H) 0 - 37 U/L   ALT 44 (H) 0 - 35 U/L   Alkaline Phosphatase 115 39 - 117 U/L   Total Bilirubin 0.5 0.3 - 1.2 mg/dL   GFR calc non Af Amer >90 >90 mL/min   GFR calc Af Amer >90 >90 mL/min    Comment: (NOTE) The eGFR has been calculated using the CKD EPI equation. This calculation has not been validated in all clinical situations. eGFR's persistently <90 mL/min signify possible Chronic Kidney Disease.    Anion gap 8 5 - 15  Lipase, blood     Status: Abnormal   Collection Time: 08/13/14  5:10 AM  Result Value Ref Range   Lipase 72 (H) 11 - 59 U/L  Glucose, capillary     Status: Abnormal   Collection Time: 08/13/14  7:23 AM  Result Value Ref Range   Glucose-Capillary 155 (H) 70 - 99 mg/dL  Glucose, capillary     Status: Abnormal   Collection Time: 08/13/14 11:48 AM  Result Value Ref Range   Glucose-Capillary 166 (H) 70 - 99 mg/dL  Glucose, capillary     Status: Abnormal   Collection Time: 08/13/14  4:50 PM  Result Value Ref Range   Glucose-Capillary 307 (H) 70 - 99 mg/dL  Glucose, capillary     Status: Abnormal   Collection Time: 08/13/14  9:16 PM  Result Value Ref Range    Glucose-Capillary 246 (H) 70 - 99 mg/dL  Glucose, capillary     Status: Abnormal   Collection Time: 08/14/14  7:27 AM  Result Value Ref Range   Glucose-Capillary 199 (H) 70 - 99 mg/dL   Comment 1 Notify RN   CBC     Status: Abnormal   Collection Time: 08/14/14  7:52 AM  Result Value Ref Range   WBC 12.0 (H) 4.0 - 10.5 K/uL   RBC 3.18 (L) 3.87 - 5.11 MIL/uL   Hemoglobin 9.9 (L) 12.0 - 15.0 g/dL   HCT 28.7 (L) 36.0 - 46.0 %   MCV 90.3 78.0 - 100.0 fL   MCH 31.1 26.0 - 34.0 pg   MCHC 34.5 30.0 - 36.0 g/dL   RDW 13.9 11.5 - 15.5 %   Platelets 296 150 - 400 K/uL  Comprehensive metabolic panel     Status: Abnormal   Collection Time: 08/14/14  7:52 AM  Result Value Ref Range   Sodium 136 135 - 145 mmol/L   Potassium 4.0 3.5 - 5.1 mmol/L   Chloride 107 96 - 112 mmol/L   CO2 24 19 - 32 mmol/L   Glucose, Bld 236 (H) 70 - 99 mg/dL   BUN 14 6 - 23 mg/dL   Creatinine, Ser 0.57 0.50 - 1.10 mg/dL   Calcium 8.0 (L) 8.4 - 10.5 mg/dL   Total Protein 5.7 (L) 6.0 - 8.3 g/dL   Albumin 2.5 (L) 3.5 - 5.2 g/dL   AST 44 (H) 0 - 37 U/L   ALT 45 (H) 0 - 35 U/L   Alkaline Phosphatase 140 (H) 39 - 117 U/L   Total Bilirubin 0.8 0.3 - 1.2 mg/dL   GFR calc non Af Amer >90 >90 mL/min   GFR calc Af Amer >90 >90 mL/min    Comment: (NOTE) The eGFR has been calculated using the CKD EPI equation. This calculation has not been validated in all clinical situations. eGFR's persistently <90 mL/min signify possible Chronic Kidney Disease.    Anion gap 5 5 - 15  Lipase, blood     Status: Abnormal   Collection Time: 08/14/14  7:52 AM  Result Value Ref Range   Lipase 91 (H) 11 - 59 U/L  Glucose, capillary     Status: Abnormal   Collection Time: 08/14/14 12:11 PM  Result Value Ref Range   Glucose-Capillary 181 (H) 70 - 99 mg/dL   Comment 1 Notify RN   Glucose, capillary     Status: Abnormal   Collection Time: 08/14/14  4:50 PM  Result Value Ref Range   Glucose-Capillary 216 (H) 70 - 99 mg/dL   Comment 1  Notify RN     US Abdomen Limited Ruq  08/14/2014   CLINICAL DATA:  Acute pancreatitis.  EXAM: US ABDOMEN LIMITED - RIGHT UPPER QUADRANT  COMPARISON:  None.  FINDINGS: Gallbladder:  Gallbladder sludge seen as well as tiny less than 5 mm gallstones. No evidence of gallbladder wall thickening. No sonographic Murphy sign noted by sonographer.  Common bile duct:  Diameter: 3 mm, which is within normal limits.  Liver:  Diffusely increased echogenicity of the hepatic parenchyma, consistent with diffuse hepatic steatosis/hepatocellular disease. No focal mass lesion identified.  IMPRESSION: Cholelithiasis, without definite sonographic signs of acute cholecystitis or biliary dilatation.  Diffuse hepatic steatosis/hepatocellular disease.   Electronically Signed   By: Earle Gell M.D.   On: 08/14/2014 13:03    Review of Systems  Constitutional: Negative for fever and chills.  Gastrointestinal: Positive for nausea, vomiting and abdominal pain.  Musculoskeletal: Positive for back pain.   Blood pressure 155/79, pulse 87, temperature 98.7 F (37.1 C), temperature source Oral, resp. rate 18, height '5\' 3"'  (1.6 m), weight 196 lb 6.9 oz (89.1 kg), SpO2 97 %. Physical Exam  Constitutional: No distress.  Obese female  HENT:  Head: Normocephalic  and atraumatic.  Eyes: EOM are normal. No scleral icterus.  GI: Soft.  Transverse scar is present without lower abdominal circular scar present. Mild epigastric as well as upper abdominal tenderness to palpation. No palpable masses.  Neurological: She is alert.  Psychiatric: She has a normal mood and affect. Her behavior is normal.    Assessment/Plan: Acute biliary pancreatitis that is slow to resolve. Oral intake is exacerbating the pancreatitis. Most likely etiology is from her gallstones.  Recommendation: Strict bowel rest. Would start TPN. Once her pancreatitis clinically and chemically resolves, would discuss laparoscopic cholecystectomy with  cholangiogram.  Kierah Goatley J 08/14/2014, 5:41 PM

## 2014-08-14 NOTE — Progress Notes (Signed)
Bad night last night (pn & Nausea, after full liq diet) but better now.  Pn resolved after pn med; no nausea today, tol sm amnt's of clr liq.  Abd adipose, quiet, but soft and nontender.  Labs show slt rise in WBC and lipase compared w/ yest.  U/S shows small stones and sludge, plus hepatic steatosis (c/w pt's obesity).  (Pt thinks her LFT's are nl at baseline.)  IMPR: resolving GS pancreatitis.  RECOMM:    1. surg consultation for consideration of lap chole prior to dischg (will defer to PCP to call surgeons unless they prefer me to do it)  2.  Would follow surgery's lead about whether to do pre-op EUS, ERCP, or MRCP although w/ 3 mm CBD on u/s and improving LFT's, it might be possible to simply do an IOC.  If there are questions thereafter, an EUS is a safer alternative to ERCP and is more sensitive for finding small stones in the CBD.  Call me prn to discuss.  Cleotis Nipper, M.D. [phone]

## 2014-08-15 DIAGNOSIS — E43 Unspecified severe protein-calorie malnutrition: Secondary | ICD-10-CM | POA: Diagnosis not present

## 2014-08-15 DIAGNOSIS — E440 Moderate protein-calorie malnutrition: Secondary | ICD-10-CM | POA: Insufficient documentation

## 2014-08-15 LAB — PHOSPHORUS: Phosphorus: 1.1 mg/dL — ABNORMAL LOW (ref 2.3–4.6)

## 2014-08-15 LAB — COMPREHENSIVE METABOLIC PANEL
ALT: 39 U/L — ABNORMAL HIGH (ref 0–35)
AST: 36 U/L (ref 0–37)
Albumin: 2.2 g/dL — ABNORMAL LOW (ref 3.5–5.2)
Alkaline Phosphatase: 147 U/L — ABNORMAL HIGH (ref 39–117)
Anion gap: 2 — ABNORMAL LOW (ref 5–15)
BUN: 12 mg/dL (ref 6–23)
CO2: 25 mmol/L (ref 19–32)
Calcium: 7.9 mg/dL — ABNORMAL LOW (ref 8.4–10.5)
Chloride: 111 mmol/L (ref 96–112)
Creatinine, Ser: 0.56 mg/dL (ref 0.50–1.10)
GFR calc Af Amer: >90 mL/min (ref >90)
GFR calc non Af Amer: >90 mL/min (ref >90)
Glucose, Bld: 174 mg/dL — ABNORMAL HIGH (ref 70–99)
Potassium: 4 mmol/L (ref 3.5–5.1)
Sodium: 138 mmol/L (ref 135–145)
Total Bilirubin: 0.6 mg/dL (ref 0.3–1.2)
Total Protein: 5 g/dL — ABNORMAL LOW (ref 6.0–8.3)

## 2014-08-15 LAB — CBC
HCT: 27.2 % — ABNORMAL LOW (ref 36.0–46.0)
Hemoglobin: 9.3 g/dL — ABNORMAL LOW (ref 12.0–15.0)
MCH: 31 pg (ref 26.0–34.0)
MCHC: 34.2 g/dL (ref 30.0–36.0)
MCV: 90.7 fL (ref 78.0–100.0)
Platelets: 345 10*3/uL (ref 150–400)
RBC: 3 MIL/uL — ABNORMAL LOW (ref 3.87–5.11)
RDW: 14.1 % (ref 11.5–15.5)
WBC: 11 10*3/uL — ABNORMAL HIGH (ref 4.0–10.5)

## 2014-08-15 LAB — LIPASE, BLOOD: Lipase: 95 U/L — ABNORMAL HIGH (ref 11–59)

## 2014-08-15 LAB — GLUCOSE, CAPILLARY
Glucose-Capillary: 153 mg/dL — ABNORMAL HIGH (ref 70–99)
Glucose-Capillary: 124 mg/dL — ABNORMAL HIGH (ref 70–99)
Glucose-Capillary: 125 mg/dL — ABNORMAL HIGH (ref 70–99)

## 2014-08-15 LAB — MAGNESIUM: Magnesium: 1.1 mg/dL — ABNORMAL LOW (ref 1.5–2.5)

## 2014-08-15 MED ORDER — INSULIN ASPART 100 UNIT/ML ~~LOC~~ SOLN
0.0000 [IU] | SUBCUTANEOUS | Status: DC
  Administered 2014-08-15 (×2): 2 [IU] via SUBCUTANEOUS

## 2014-08-15 MED ORDER — MAGNESIUM SULFATE 4 GM/100ML IV SOLN
4.0000 g | Freq: Once | INTRAVENOUS | Status: AC
  Administered 2014-08-15: 4 g via INTRAVENOUS
  Filled 2014-08-15: qty 100

## 2014-08-15 MED ORDER — SODIUM PHOSPHATE 3 MMOLE/ML IV SOLN
30.0000 mmol | Freq: Once | INTRAVENOUS | Status: AC
  Administered 2014-08-15: 30 mmol via INTRAVENOUS
  Filled 2014-08-15: qty 10

## 2014-08-15 NOTE — Progress Notes (Signed)
Writer alerted on call MD for Dr. Reynaldo Minium that PICC was unable to be placed by IV team.

## 2014-08-15 NOTE — Progress Notes (Signed)
INITIAL NUTRITION ASSESSMENT  DOCUMENTATION CODES Per approved criteria  -Non-severe (moderate) malnutrition in the context of acute illness or injury  Pt meets criteria for moderate MALNUTRITION in the context of acute illness as evidenced by energy intake <75% for >/=7 days and mild muscle and fat depletion.  INTERVENTION: TPN per pharmacy Recommend monitor magnesium, potassium, and phosphorus daily for at least 3 days, MD to replete as needed, as pt is at risk for refeeding syndrome given poor PO intake x months and malnutrition status RD to continue to monitor.  NUTRITION DIAGNOSIS: Inadequate oral intake related to pancreatitis as evidenced by poor PO intake <75% x months.   Goal: Parenteral nutrition to provide 60-70% of estimated calorie needs (22-25 kcals/kg ideal body weight) and 100% of estimated protein needs, based on ASPEN guidelines for hypocaloric, high protein feeding in critically ill obese individuals  Monitor:  TPN regimen & tolerance, weight, labs, I/O's  Reason for Assessment: Consult for TPN  Admitting Dx: <principal problem not specified>  ASSESSMENT: 64 y.o. female with a history of diabetes mellitus type 2, hypertension, and hiatal hernia who presents the emergency department complaining of nausea and vomiting ongoing for 1 week with severe abdominal pain generalized her abdomen since last night. She reports vomiting twice today.   Pt NPO for bowel rest d/t pancreatitis. Pt will have laparoscopic cholecystectomy prior to discharge per GI note.  Pt reports eating very lightly in the morning PTA. Pt was eating very little but was able to tolerate liquids. Pt states that her poor PO has been for months. Just recently though, she has been having N/V x 1 week.  Pt with history of a partial bypass surgery and gastrinoma. Pt and family were unable to state when the pt had this surgery. Per surgery note, pt had surgery in 2008.   Pt states her UBW is 230 lb. Unsure  of weight loss d/t surgery vs. poor PO intake. Pt at risk for refeeding syndrome given poor PO intake x months.   Nutrition Focused Physical Exam:  Subcutaneous Fat:  Orbital Region: WNL Upper Arm Region: mild depletion Thoracic and Lumbar Region: NA  Muscle:  Temple Region: mild depletion Clavicle Bone Region: WNL Clavicle and Acromion Bone Region: WNL Scapular Bone Region: WNL Dorsal Hand: WNL Patellar Region: WNL Anterior Thigh Region: WNL Posterior Calf Region: WNL  Edema: no LE edema  Labs reviewed: Glucose 174 Low Mg  Height: Ht Readings from Last 1 Encounters:  08/08/14 5\' 3"  (1.6 m)    Weight: Wt Readings from Last 1 Encounters:  08/08/14 196 lb 6.9 oz (89.1 kg)    Ideal Body Weight: 115 lb  % Ideal Body Weight: 170%  Wt Readings from Last 10 Encounters:  08/08/14 196 lb 6.9 oz (89.1 kg)    Usual Body Weight: 230 lb -per pt  % Usual Body Weight: 85%  BMI:  Body mass index is 34.8 kg/(m^2).  Estimated Nutritional Needs: Kcal: 1150-1300 Protein: 65-75g Fluid: 1.3L/day  Skin: intact  Diet Order: Diet NPO time specified Except for: Ice Chips, Sips with Meds  EDUCATION NEEDS: -Education needs addressed   Intake/Output Summary (Last 24 hours) at 08/15/14 1043 Last data filed at 08/15/14 0517  Gross per 24 hour  Intake 1926.25 ml  Output      0 ml  Net 1926.25 ml    Last BM: 3/2  Labs:   Recent Labs Lab 08/08/14 1235 08/09/14 0522  08/13/14 0510 08/14/14 0752 08/15/14 0510  NA 139 141  < >  139 136 138  K 2.4* 3.0*  < > 3.9 4.0 4.0  CL 96 100  < > 109 107 111  CO2 28 31  < > 22 24 25   BUN 17 22  < > 14 14 12   CREATININE 0.87 0.79  < > 0.60 0.57 0.56  CALCIUM 11.9* 10.8*  < > 7.9* 8.0* 7.9*  MG 0.8* 1.2*  --   --   --   --   GLUCOSE 380* 238*  < > 172* 236* 174*  < > = values in this interval not displayed.  CBG (last 3)   Recent Labs  08/14/14 1650 08/14/14 2133 08/15/14 0727  GLUCAP 216* 191* 153*    Scheduled  Meds: . beta carotene w/minerals  1 tablet Oral Daily  . enoxaparin (LOVENOX) injection  40 mg Subcutaneous Q24H  . insulin aspart  0-15 Units Subcutaneous 6 times per day  . insulin glargine  15 Units Subcutaneous QHS  . multivitamin with minerals  1 tablet Oral Daily  . pantoprazole  40 mg Oral Q0600  . sertraline  100 mg Oral Daily    Continuous Infusions: . sodium chloride 75 mL/hr at 08/14/14 2136    Past Medical History  Diagnosis Date  . Hiatal hernia   . Hypertension   . Diabetes mellitus without complication     Past Surgical History  Procedure Laterality Date  . Hernia repair      Clayton Bibles, MS, RD, LDN Pager: [phone] After Hours Pager: [phone]

## 2014-08-15 NOTE — Progress Notes (Addendum)
General Surgery Note  LOS: 7 days  POD -     Assessment/Plan: 1.  Gall stone pancreatitis  Lipase - 91 - 08/15/2014  Dr. Hassell Done is our surgeon of the week this coming week.  He will decide on timing of the surgery - probably early in the week.  2.  Anemia   Hgb - 9.3 - 08/15/2014 3.  DVT prophylaxis - Lovenox 4.  Nurtition - Alb 2.2 - 08/15/2014 5.  History of "gastric bypass" in 2008 in [facility] and ventral hernia repair in 2010.  She said that her pre op weight was 300.  She is now about 210.  ERCP may be difficult per her GI anatomy   Active Problems:   Acute pancreatitis   Essential hypertension   Diabetes mellitus, type II   GERD (gastroesophageal reflux disease)  Subjective:  Doing okay.  Not hungry, but with minimal abdominal pain.  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 Objective:   Filed Vitals:   08/15/14 0549  BP: 157/83  Pulse: 80  Temp: 99.2 F (37.3 C)  Resp: 18     Intake/Output from previous day:  03/05 0701 - 03/06 0700 In: 2301.3 [P.O.:720; I.V.:1581.3] Out: -   Intake/Output this shift:      Physical Exam:   General: WN obese WF who is alert and oriented.    HEENT: Normal. Pupils equal. .   Lungs: Clear   Abdomen: Has what feels like a epigastric hernia (but I don't see anything on CT scan).  Has pannus.  BS present.   Lab Results:    Recent Labs  08/14/14 0752 08/15/14 0510  WBC 12.0* 11.0*  HGB 9.9* 9.3*  HCT 28.7* 27.2*  PLT 296 345    BMET   Recent Labs  08/14/14 0752 08/15/14 0510  NA 136 138  K 4.0 4.0  CL 107 111  CO2 24 25  GLUCOSE 236* 174*  BUN 14 12  CREATININE 0.57 0.56  CALCIUM 8.0* 7.9*    PT/INR  No results for input(s): LABPROT, INR in the last 72 hours.  ABG  No results for input(s): PHART, HCO3 in the last 72 hours.  Invalid input(s): PCO2, PO2   Studies/Results:  US Abdomen Limited Ruq  08/14/2014   CLINICAL DATA:  Acute pancreatitis.  EXAM: US  ABDOMEN LIMITED - RIGHT UPPER QUADRANT  COMPARISON:  None.  FINDINGS: Gallbladder:  Gallbladder sludge seen as well as tiny less than 5 mm gallstones. No evidence of gallbladder wall thickening. No sonographic Murphy sign noted by sonographer.  Common bile duct:  Diameter: 3 mm, which is within normal limits.  Liver:  Diffusely increased echogenicity of the hepatic parenchyma, consistent with diffuse hepatic steatosis/hepatocellular disease. No focal mass lesion identified.  IMPRESSION: Cholelithiasis, without definite sonographic signs of acute cholecystitis or biliary dilatation.  Diffuse hepatic steatosis/hepatocellular disease.   Electronically Signed   By: Earle Gell M.D.   On: 08/14/2014 13:03     Anti-infectives:   Anti-infectives    None      Alphonsa Overall, MD, FACS Pager: [phone] Surgery Office: [phone] 08/15/2014

## 2014-08-15 NOTE — Progress Notes (Signed)
Labs essentially stable. Patient feels somewhat better than she did the previous night. Abdomen is soft and essentially nontender, patient looks very comfortable and is in good spirits, with family at the bedside.  Surgeons' notes reviewed.  Agree with current plan for PICC line, bowel rest, and TNA while awaiting resolution of pancreatitis prior to anticipated laparoscopic cholecystectomy prior to discharge.  We will plan to follow at a distance with you, since at the moment it does not appear that there is an active role for [place] in this patient's care. Please call us at any time if more intermediate input is needed.  Cleotis Nipper, M.D. [phone]

## 2014-08-15 NOTE — Progress Notes (Signed)
Attempt to insert PICC x 2 unsuccessful.  Able to gain access to vein x 2 and thread guide wire approximately 20cm in Right brachial/  basilic vein.  Remaining veins measure > 50 % bilaterally.  No cephalic noted on either side.  If PICC still desired, please refer to IR.

## 2014-08-15 NOTE — Progress Notes (Signed)
PARENTERAL NUTRITION CONSULT NOTE - INITIAL  Pharmacy Consult for TPN Indication: Severe pancreatitis  No Known Allergies  Patient Measurements: Height: _0  (160 cm) Weight: 196 lb 6.9 oz (89.1 kg) IBW/kg (Calculated) : 52.4 Adjusted Body Weight: 63.4 kg Usual Weight:   Vital Signs: Temp: 99.2 F (37.3 C) (03/06 0549) Temp Source: Oral (03/06 0549) BP: 157/83 mmHg (03/06 0549) Pulse Rate: 80 (03/06 0549) Intake/Output from previous day: 03/05 0701 - 03/06 0700 In: 2301.3 [P.O.:720; I.V.:1581.3] Out: -  Intake/Output from this shift:    Labs:  Recent Labs  08/13/14 0510 08/14/14 0752 08/15/14 0510  WBC 10.5 12.0* 11.0*  HGB 9.7* 9.9* 9.3*  HCT 27.9* 28.7* 27.2*  PLT 267 296 345     Recent Labs  08/13/14 0510 08/14/14 0752 08/15/14 0510  NA 139 136 138  K 3.9 4.0 4.0  CL 109 107 111  CO2 _1 GLUCOSE 172* 236* 174*  BUN _2 CREATININE 0.60 0.57 0.56  CALCIUM 7.9* 8.0* 7.9*  PROT 5.3* 5.7* 5.0*  ALBUMIN 2.5* 2.5* 2.2*  AST 52* 44* 36  ALT 44* 45* 39*  ALKPHOS 115 140* 147*  BILITOT 0.5 0.8 0.6   Estimated Creatinine Clearance: 74.3 mL/min (by C-G formula based on Cr of 0.56).    Recent Labs  08/14/14 1650 08/14/14 2133 08/15/14 0727  GLUCAP 216* 191* 153*    Medical History: Past Medical History  Diagnosis Date  . Hiatal hernia   . Hypertension   . Diabetes mellitus without complication     Medications:  Scheduled:  . beta carotene w/minerals  1 tablet Oral Daily  . enoxaparin (LOVENOX) injection  40 mg Subcutaneous Q24H  . insulin aspart  0-5 Units Subcutaneous QHS  . insulin aspart  0-9 Units Subcutaneous TID WC  . insulin glargine  15 Units Subcutaneous QHS  . lipase/protease/amylase  36,000 Units Oral TID AC  . multivitamin with minerals  1 tablet Oral Daily  . pantoprazole  40 mg Oral Q0600  . sertraline  100 mg Oral Daily   Infusions:  . sodium chloride 75 mL/hr at 08/14/14 2136    Insulin Requirements:   Last 24 hours: 8 units SSI Lantus 15 units QHS.  Last dose 3/5 Now starting moderate SSI q4h  Current Nutrition:  Diet: NPO IVF: NS at 75 ml/min  Central access: PICC line ordered 3/6 TPN start date: 3/6  ASSESSMENT                                                                                                          HPI: 65 yoF admitted 2/28 with abdominal pain, N/V, and abdominal CT confirms acute pancreatitis.PMH of HTN, DMT2, GERD, gastric bypass (2008), and ventral hernia repair (2010).She was initially NPO, then clear liquid diet then full liquid diet.She continues to have N/V and is not tolerating diet.US shows gallstone pancreatitis and she is planning to have cholecystectomy this week (timing TBD).She has been made NPO and pharmacy is consulted to dose TPN.  Significant events:  3/6 PICC  line ordered, but unable to be placed per IV team.  Pt was referred to IR service tomorrow.  No TPN orders to start today.  Today:   Glucose: 153, within goal range.  F/u CBG with diet changes.  Electrolytes: Na, K, Corr Ca 9.34 are WNL.  Mag (1.1) and Phos (1.1) are low.    Renal: SCr stable, CrCl ~ 74 ml/min  LFTs improving (36/39), Lipase elevated (95), alk phos increasing (147), Tbili WNL  TGs: pending  Albumin: 2.2  Prealbumin: pending  NUTRITIONAL GOALS                                                                                             RD recs (3/6): 1150 -1300 Kcal/day, 65-75 g protein/day  Parenteral nutrition to provide 60-70% of estimated calorie needs (22-25 kcals/kg ideal body weight) and 100% of estimated protein needs Clinimix 5/15 at a goal rate of 55 ml/hr + 20% fat emulsion at 10 ml/hr to provide: 66 g/day protein, 1417 Kcal/day.  PLAN                                                                                                                          Magnesium 4g IV once  NaPhos 30 mmol IV once  If PICC line placed, At 1800  today:  Start Clinimix E 5/15 at 40 ml/hr.  20% fat emulsion at 10 ml/hr.  Plan to advance as tolerated to the goal rate.  TPN to contain standard multivitamins and trace elements.  Reduce IVF to 35 ml/hr.  Continue CBGs and moderate SSI q4h  Continue Lantus 15 units QHS  TPN lab panels on Mondays & Thursdays.  Monitor closely for refeeding syndrome.   Gretta Arab PharmD, BCPS Pager [phone] 08/15/2014 10:13 AM

## 2014-08-15 NOTE — Progress Notes (Signed)
Subjective: Ongoing abdominal pain is slightly better with bowel rest.  Has been evaluated by General Surgery and anticipating cholecystectomy prior to discharge.  Objective: Vital signs in last 24 hours: Temp:  [98.7 F (37.1 C)-99.2 F (37.3 C)] 99.2 F (37.3 C) (03/06 0549) Pulse Rate:  [80-87] 80 (03/06 0549) Resp:  [18] 18 (03/06 0549) BP: (145-157)/(79-84) 157/83 mmHg (03/06 0549) SpO2:  [97 %] 97 % (03/06 0549) Weight change:  Last BM Date: 08/11/14  CBG (last 3)   Recent Labs  08/14/14 1650 08/14/14 2133 08/15/14 0727  GLUCAP 216* 191* 153*    Intake/Output from previous day: 03/05 0701 - 03/06 0700 In: 2301.3 [P.O.:720; I.V.:1581.3] Out: -  Intake/Output this shift:    General appearance: alert and no distress Eyes: no scleral icterus Throat: oropharynx moist without erythema Resp: clear to auscultation bilaterally Cardio: regular rate and rhythm GI: soft,mild diffuse tenderness, no rebound/guarding; bowel sounds normal; no masses,  no organomegaly Extremities: no clubbing, cyanosis or edema   Lab Results:  Recent Labs  08/14/14 0752 08/15/14 0510  NA 136 138  K 4.0 4.0  CL 107 111  CO2 24 25  GLUCOSE 236* 174*  BUN 14 12  CREATININE 0.57 0.56  CALCIUM 8.0* 7.9*    Recent Labs  08/14/14 0752 08/15/14 0510  AST 44* 36  ALT 45* 39*  ALKPHOS 140* 147*  BILITOT 0.8 0.6  PROT 5.7* 5.0*  ALBUMIN 2.5* 2.2*    Recent Labs  08/14/14 0752 08/15/14 0510  WBC 12.0* 11.0*  HGB 9.9* 9.3*  HCT 28.7* 27.2*  MCV 90.3 90.7  PLT 296 345   Studies/Results: US Abdomen Limited Ruq  08/14/2014   CLINICAL DATA:  Acute pancreatitis.  EXAM: US ABDOMEN LIMITED - RIGHT UPPER QUADRANT  COMPARISON:  None.  FINDINGS: Gallbladder:  Gallbladder sludge seen as well as tiny less than 5 mm gallstones. No evidence of gallbladder wall thickening. No sonographic Murphy sign noted by sonographer.  Common bile duct:  Diameter: 3 mm, which is within normal limits.   Liver:  Diffusely increased echogenicity of the hepatic parenchyma, consistent with diffuse hepatic steatosis/hepatocellular disease. No focal mass lesion identified.  IMPRESSION: Cholelithiasis, without definite sonographic signs of acute cholecystitis or biliary dilatation.  Diffuse hepatic steatosis/hepatocellular disease.   Electronically Signed   By: Earle Gell M.D.   On: 08/14/2014 13:03     Medications: Scheduled: . beta carotene w/minerals  1 tablet Oral Daily  . enoxaparin (LOVENOX) injection  40 mg Subcutaneous Q24H  . insulin aspart  0-5 Units Subcutaneous QHS  . insulin aspart  0-9 Units Subcutaneous TID WC  . insulin glargine  15 Units Subcutaneous QHS  . lipase/protease/amylase  36,000 Units Oral TID AC  . multivitamin with minerals  1 tablet Oral Daily  . pantoprazole  40 mg Oral Q0600  . sertraline  100 mg Oral Daily   Continuous: . sodium chloride 75 mL/hr at 08/14/14 2136    Assessment/Plan: Active Problems: 1. Acute biliary pancreatitis- ultrasound reveals cholelithiasis consistent with gallstone pancreatitis.  Slow to resolve.  Symptoms worsened by liquid diet.  Will keep NPO and start TNA via PICC line until tolerating diet postoperatively (per [facility] recommendation).   2. Cholelithiasis- anticipate cholecystectomy in 1-2 days per [facility]. 3. Protein Calorie Malnutrition, severe- will start TNA due to prolonged NPO status.   4. Essential hypertension- BP relatively stable.  Consider ACE inhibitor on discharge.  Hydralazine prn for now. 5. Diabetes mellitus, type II- adequate control on Lantus  and SSI (change to q4 hours with TNA) 6. GERD (gastroesophageal reflux disease)- continue PPI. 7. Disposition- per general surgery postoperatively.     LOS: 7 days   Marton Redwood 08/15/2014, 9:50 AM

## 2014-08-15 NOTE — Progress Notes (Signed)
IV team unable to place PICC on floor. Placing order for IR guided which will occur tomorrow. TPN on hold until able to have line placed   Velna Hatchet, MD 08/15/2014 5:25 PM

## 2014-08-16 ENCOUNTER — Inpatient Hospital Stay (HOSPITAL_COMMUNITY): Payer: Medicare Other | Admitting: Certified Registered"

## 2014-08-16 ENCOUNTER — Inpatient Hospital Stay (HOSPITAL_COMMUNITY): Payer: Medicare Other

## 2014-08-16 ENCOUNTER — Encounter (HOSPITAL_COMMUNITY): Payer: Self-pay | Admitting: Certified Registered"

## 2014-08-16 ENCOUNTER — Encounter (HOSPITAL_COMMUNITY)
Admission: EM | Disposition: A | Discharge status: Home / Self Care | Payer: Self-pay | Source: Home / Self Care | Attending: Internal Medicine

## 2014-08-16 HISTORY — PX: CHOLECYSTECTOMY: SHX55

## 2014-08-16 LAB — GLUCOSE, CAPILLARY
Glucose-Capillary: 108 mg/dL — ABNORMAL HIGH (ref 70–99)
Glucose-Capillary: 108 mg/dL — ABNORMAL HIGH (ref 70–99)
Glucose-Capillary: 125 mg/dL — ABNORMAL HIGH (ref 70–99)
Glucose-Capillary: 125 mg/dL — ABNORMAL HIGH (ref 70–99)
Glucose-Capillary: 128 mg/dL — ABNORMAL HIGH (ref 70–99)
Glucose-Capillary: 154 mg/dL — ABNORMAL HIGH (ref 70–99)
Glucose-Capillary: 183 mg/dL — ABNORMAL HIGH (ref 70–99)

## 2014-08-16 LAB — DIFFERENTIAL
Basophils Absolute: 0 10*3/uL (ref 0.0–0.1)
Basophils Relative: 0 % (ref 0–1)
Eosinophils Absolute: 0.1 10*3/uL (ref 0.0–0.7)
Eosinophils Relative: 1 % (ref 0–5)
Lymphocytes Relative: 11 % — ABNORMAL LOW (ref 12–46)
Lymphs Abs: 1.3 10*3/uL (ref 0.7–4.0)
Monocytes Absolute: 1.1 10*3/uL — ABNORMAL HIGH (ref 0.1–1.0)
Monocytes Relative: 9 % (ref 3–12)
Neutro Abs: 9.4 10*3/uL — ABNORMAL HIGH (ref 1.7–7.7)
Neutrophils Relative %: 79 % — ABNORMAL HIGH (ref 43–77)

## 2014-08-16 LAB — COMPREHENSIVE METABOLIC PANEL
ALT: 39 U/L — ABNORMAL HIGH (ref 0–35)
AST: 40 U/L — ABNORMAL HIGH (ref 0–37)
Albumin: 2.3 g/dL — ABNORMAL LOW (ref 3.5–5.2)
Alkaline Phosphatase: 183 U/L — ABNORMAL HIGH (ref 39–117)
Anion gap: 8 (ref 5–15)
BUN: 9 mg/dL (ref 6–23)
CO2: 21 mmol/L (ref 19–32)
Calcium: 7.5 mg/dL — ABNORMAL LOW (ref 8.4–10.5)
Chloride: 111 mmol/L (ref 96–112)
Creatinine, Ser: 0.58 mg/dL (ref 0.50–1.10)
GFR calc Af Amer: >90 mL/min (ref >90)
GFR calc non Af Amer: >90 mL/min (ref >90)
Glucose, Bld: 117 mg/dL — ABNORMAL HIGH (ref 70–99)
Potassium: 2.8 mmol/L — ABNORMAL LOW (ref 3.5–5.1)
Sodium: 140 mmol/L (ref 135–145)
Total Bilirubin: 0.6 mg/dL (ref 0.3–1.2)
Total Protein: 5.5 g/dL — ABNORMAL LOW (ref 6.0–8.3)

## 2014-08-16 LAB — CBC
HCT: 28.3 % — ABNORMAL LOW (ref 36.0–46.0)
Hemoglobin: 9.7 g/dL — ABNORMAL LOW (ref 12.0–15.0)
MCH: 30.8 pg (ref 26.0–34.0)
MCHC: 34.3 g/dL (ref 30.0–36.0)
MCV: 89.8 fL (ref 78.0–100.0)
Platelets: 439 10*3/uL — ABNORMAL HIGH (ref 150–400)
RBC: 3.15 MIL/uL — ABNORMAL LOW (ref 3.87–5.11)
RDW: 14 % (ref 11.5–15.5)
WBC: 11.9 10*3/uL — ABNORMAL HIGH (ref 4.0–10.5)

## 2014-08-16 LAB — PHOSPHORUS: Phosphorus: 3.4 mg/dL (ref 2.3–4.6)

## 2014-08-16 LAB — SURGICAL PCR SCREEN
MRSA, PCR: NEGATIVE
Staphylococcus aureus: NEGATIVE

## 2014-08-16 LAB — PREALBUMIN: Prealbumin: 8.4 mg/dL — ABNORMAL LOW (ref 17.0–34.0)

## 2014-08-16 LAB — TRIGLYCERIDES: Triglycerides: 161 mg/dL — ABNORMAL HIGH (ref <150)

## 2014-08-16 LAB — MAGNESIUM: Magnesium: 1.7 mg/dL (ref 1.5–2.5)

## 2014-08-16 SURGERY — LAPAROSCOPIC CHOLECYSTECTOMY WITH INTRAOPERATIVE CHOLANGIOGRAM
Anesthesia: General | Site: Abdomen

## 2014-08-16 MED ORDER — INSULIN ASPART 100 UNIT/ML ~~LOC~~ SOLN
0.0000 [IU] | SUBCUTANEOUS | Status: DC
  Administered 2014-08-16 – 2014-08-17 (×3): 4 [IU] via SUBCUTANEOUS
  Administered 2014-08-17: 7 [IU] via SUBCUTANEOUS
  Administered 2014-08-17 (×2): 4 [IU] via SUBCUTANEOUS

## 2014-08-16 MED ORDER — MIDAZOLAM HCL 2 MG/2ML IJ SOLN
INTRAMUSCULAR | Status: AC
  Filled 2014-08-16: qty 2

## 2014-08-16 MED ORDER — 0.9 % SODIUM CHLORIDE (POUR BTL) OPTIME
TOPICAL | Status: DC | PRN
  Administered 2014-08-16: 1000 mL

## 2014-08-16 MED ORDER — MORPHINE SULFATE 2 MG/ML IJ SOLN
1.0000 mg | INTRAMUSCULAR | Status: DC | PRN
  Administered 2014-08-17 (×4): 1 mg via INTRAVENOUS
  Filled 2014-08-16 (×4): qty 1

## 2014-08-16 MED ORDER — POTASSIUM CHLORIDE CRYS ER 20 MEQ PO TBCR
40.0000 meq | EXTENDED_RELEASE_TABLET | Freq: Once | ORAL | Status: AC
  Administered 2014-08-16: 40 meq via ORAL
  Filled 2014-08-16: qty 2

## 2014-08-16 MED ORDER — NEOSTIGMINE METHYLSULFATE 10 MG/10ML IV SOLN
INTRAVENOUS | Status: DC | PRN
  Administered 2014-08-16: 4 mg via INTRAVENOUS

## 2014-08-16 MED ORDER — SUCCINYLCHOLINE CHLORIDE 20 MG/ML IJ SOLN
INTRAMUSCULAR | Status: DC | PRN
  Administered 2014-08-16: 100 mg via INTRAVENOUS

## 2014-08-16 MED ORDER — CEFAZOLIN SODIUM 1-5 GM-% IV SOLN
1.0000 g | Freq: Four times a day (QID) | INTRAVENOUS | Status: AC
  Administered 2014-08-16 – 2014-08-17 (×3): 1 g via INTRAVENOUS
  Filled 2014-08-16 (×3): qty 50

## 2014-08-16 MED ORDER — OXYCODONE HCL 5 MG/5ML PO SOLN
5.0000 mg | Freq: Once | ORAL | Status: DC | PRN
  Filled 2014-08-16: qty 5

## 2014-08-16 MED ORDER — ONDANSETRON HCL 4 MG/2ML IJ SOLN
4.0000 mg | Freq: Four times a day (QID) | INTRAMUSCULAR | Status: DC | PRN
  Administered 2014-08-17 (×2): 4 mg via INTRAVENOUS
  Filled 2014-08-16 (×2): qty 2

## 2014-08-16 MED ORDER — BUPIVACAINE-EPINEPHRINE 0.25% -1:200000 IJ SOLN
INTRAMUSCULAR | Status: DC | PRN
  Administered 2014-08-16: 25 mL

## 2014-08-16 MED ORDER — ENOXAPARIN SODIUM 40 MG/0.4ML ~~LOC~~ SOLN: 40.0000 mg | SUBCUTANEOUS | Status: DC

## 2014-08-16 MED ORDER — FENTANYL CITRATE 0.05 MG/ML IJ SOLN
INTRAMUSCULAR | Status: DC | PRN
  Administered 2014-08-16: 50 ug via INTRAVENOUS
  Administered 2014-08-16: 100 ug via INTRAVENOUS
  Administered 2014-08-16 (×2): 50 ug via INTRAVENOUS

## 2014-08-16 MED ORDER — ONDANSETRON HCL 4 MG/2ML IJ SOLN
INTRAMUSCULAR | Status: DC | PRN
  Administered 2014-08-16: 4 mg via INTRAVENOUS

## 2014-08-16 MED ORDER — GLYCOPYRROLATE 0.2 MG/ML IJ SOLN
INTRAMUSCULAR | Status: DC | PRN
  Administered 2014-08-16: 0.6 mg via INTRAVENOUS

## 2014-08-16 MED ORDER — DEXAMETHASONE SODIUM PHOSPHATE 10 MG/ML IJ SOLN
INTRAMUSCULAR | Status: AC
  Filled 2014-08-16: qty 1

## 2014-08-16 MED ORDER — FENTANYL CITRATE 0.05 MG/ML IJ SOLN
INTRAMUSCULAR | Status: AC
  Filled 2014-08-16: qty 5

## 2014-08-16 MED ORDER — ONDANSETRON HCL 4 MG/2ML IJ SOLN
INTRAMUSCULAR | Status: AC
  Filled 2014-08-16: qty 2

## 2014-08-16 MED ORDER — CEFAZOLIN SODIUM-DEXTROSE 2-3 GM-% IV SOLR
INTRAVENOUS | Status: AC
  Filled 2014-08-16: qty 50

## 2014-08-16 MED ORDER — ENOXAPARIN SODIUM 40 MG/0.4ML ~~LOC~~ SOLN
40.0000 mg | SUBCUTANEOUS | Status: DC
  Filled 2014-08-16: qty 0.4

## 2014-08-16 MED ORDER — HYDROMORPHONE HCL 1 MG/ML IJ SOLN: 0.2500 mg | INTRAMUSCULAR | Status: DC | PRN

## 2014-08-16 MED ORDER — ROCURONIUM BROMIDE 100 MG/10ML IV SOLN
INTRAVENOUS | Status: DC | PRN
  Administered 2014-08-16: 5 mg via INTRAVENOUS
  Administered 2014-08-16: 25 mg via INTRAVENOUS

## 2014-08-16 MED ORDER — PROMETHAZINE HCL 25 MG/ML IJ SOLN: 6.2500 mg | INTRAMUSCULAR | Status: DC | PRN

## 2014-08-16 MED ORDER — PROPOFOL 10 MG/ML IV BOLUS
INTRAVENOUS | Status: AC
  Filled 2014-08-16: qty 20

## 2014-08-16 MED ORDER — LIDOCAINE HCL (CARDIAC) 20 MG/ML IV SOLN
INTRAVENOUS | Status: DC | PRN
  Administered 2014-08-16: 50 mg via INTRAVENOUS

## 2014-08-16 MED ORDER — LACTATED RINGERS IV SOLN
INTRAVENOUS | Status: DC | PRN
  Administered 2014-08-16 (×2): via INTRAVENOUS

## 2014-08-16 MED ORDER — CEFAZOLIN SODIUM-DEXTROSE 2-3 GM-% IV SOLR
2.0000 g | Freq: Once | INTRAVENOUS | Status: AC
  Administered 2014-08-16: 2 g via INTRAVENOUS

## 2014-08-16 MED ORDER — PHENYLEPHRINE HCL 10 MG/ML IJ SOLN
INTRAMUSCULAR | Status: DC | PRN
  Administered 2014-08-16: 80 ug via INTRAVENOUS

## 2014-08-16 MED ORDER — HYDROCODONE-ACETAMINOPHEN 5-325 MG PO TABS: 1.0000 | ORAL_TABLET | ORAL | Status: DC | PRN

## 2014-08-16 MED ORDER — ROCURONIUM BROMIDE 100 MG/10ML IV SOLN
INTRAVENOUS | Status: AC
  Filled 2014-08-16: qty 1

## 2014-08-16 MED ORDER — ONDANSETRON HCL 4 MG PO TABS: 4.0000 mg | ORAL_TABLET | Freq: Four times a day (QID) | ORAL | Status: DC | PRN

## 2014-08-16 MED ORDER — PHENYLEPHRINE 40 MCG/ML (10ML) SYRINGE FOR IV PUSH (FOR BLOOD PRESSURE SUPPORT)
PREFILLED_SYRINGE | INTRAVENOUS | Status: AC
  Filled 2014-08-16: qty 10

## 2014-08-16 MED ORDER — DEXAMETHASONE SODIUM PHOSPHATE 10 MG/ML IJ SOLN
INTRAMUSCULAR | Status: DC | PRN
  Administered 2014-08-16: 10 mg via INTRAVENOUS

## 2014-08-16 MED ORDER — MIDAZOLAM HCL 5 MG/5ML IJ SOLN
INTRAMUSCULAR | Status: DC | PRN
  Administered 2014-08-16: 2 mg via INTRAVENOUS

## 2014-08-16 MED ORDER — OXYCODONE HCL 5 MG PO TABS: 5.0000 mg | ORAL_TABLET | Freq: Once | ORAL | Status: DC | PRN

## 2014-08-16 MED ORDER — KCL IN DEXTROSE-NACL 20-5-0.45 MEQ/L-%-% IV SOLN
INTRAVENOUS | Status: DC
  Administered 2014-08-16: 15:00:00 via INTRAVENOUS
  Filled 2014-08-16 (×2): qty 1000

## 2014-08-16 MED ORDER — NEOSTIGMINE METHYLSULFATE 10 MG/10ML IV SOLN
INTRAVENOUS | Status: AC
  Filled 2014-08-16: qty 1

## 2014-08-16 MED ORDER — IOHEXOL 300 MG/ML  SOLN
INTRAMUSCULAR | Status: DC | PRN
  Administered 2014-08-16: 4 mL

## 2014-08-16 MED ORDER — HEPARIN SODIUM (PORCINE) 5000 UNIT/ML IJ SOLN
5000.0000 [IU] | Freq: Three times a day (TID) | INTRAMUSCULAR | Status: DC
  Administered 2014-08-16 – 2014-08-17 (×2): 5000 [IU] via SUBCUTANEOUS
  Filled 2014-08-16 (×5): qty 1

## 2014-08-16 MED ORDER — GLYCOPYRROLATE 0.2 MG/ML IJ SOLN
INTRAMUSCULAR | Status: AC
  Filled 2014-08-16: qty 3

## 2014-08-16 MED ORDER — LACTATED RINGERS IR SOLN
Status: DC | PRN
  Administered 2014-08-16: 1000 mL

## 2014-08-16 MED ORDER — PROPOFOL 10 MG/ML IV BOLUS
INTRAVENOUS | Status: DC | PRN
  Administered 2014-08-16: 160 mg via INTRAVENOUS

## 2014-08-16 MED ORDER — POTASSIUM CHLORIDE IN NACL 40-0.9 MEQ/L-% IV SOLN
INTRAVENOUS | Status: DC
  Filled 2014-08-16 (×2): qty 1000

## 2014-08-16 MED ORDER — BUPIVACAINE-EPINEPHRINE (PF) 0.25% -1:200000 IJ SOLN
INTRAMUSCULAR | Status: AC
  Filled 2014-08-16: qty 30

## 2014-08-16 MED ORDER — LIDOCAINE HCL (CARDIAC) 20 MG/ML IV SOLN
INTRAVENOUS | Status: AC
  Filled 2014-08-16: qty 5

## 2014-08-16 MED ORDER — LACTATED RINGERS IV SOLN: INTRAVENOUS | Status: DC

## 2014-08-16 SURGICAL SUPPLY — 36 items
APPLIER CLIP ROT 10 11.4 M/L (STAPLE) ×2
BENZOIN TINCTURE PRP APPL 2/3 (GAUZE/BANDAGES/DRESSINGS) ×2 IMPLANT
CABLE HIGH FREQUENCY MONO STRZ (ELECTRODE) ×2 IMPLANT
CATH REDDICK CHOLANGI 4FR 50CM (CATHETERS) ×2 IMPLANT
CLIP APPLIE ROT 10 11.4 M/L (STAPLE) ×1 IMPLANT
COVER MAYO STAND STRL (DRAPES) ×2 IMPLANT
DECANTER SPIKE VIAL GLASS SM (MISCELLANEOUS) IMPLANT
DRAPE C-ARM 42X120 X-RAY (DRAPES) ×2 IMPLANT
DRAPE LAPAROSCOPIC ABDOMINAL (DRAPES) ×2 IMPLANT
ELECT REM PT RETURN 9FT ADLT (ELECTROSURGICAL) ×2
ELECTRODE REM PT RTRN 9FT ADLT (ELECTROSURGICAL) ×1 IMPLANT
GLOVE BIOGEL M 8.0 STRL (GLOVE) ×2 IMPLANT
GLOVE BIOGEL PI IND STRL 6.5 (GLOVE) ×2 IMPLANT
GLOVE BIOGEL PI INDICATOR 6.5 (GLOVE) ×2
GLOVE SURG SS PI 6.5 STRL IVOR (GLOVE) ×2 IMPLANT
GOWN STRL REUS W/TWL XL LVL3 (GOWN DISPOSABLE) ×6 IMPLANT
HEMOSTAT SURGICEL 4X8 (HEMOSTASIS) IMPLANT
IV CATH 14GX2 1/4 (CATHETERS) ×2 IMPLANT
KIT BASIN OR (CUSTOM PROCEDURE TRAY) ×2 IMPLANT
LIQUID BAND (GAUZE/BANDAGES/DRESSINGS) ×2 IMPLANT
POUCH RETRIEVAL ECOSAC 10 (ENDOMECHANICALS) IMPLANT
POUCH RETRIEVAL ECOSAC 10MM (ENDOMECHANICALS)
POUCH SPECIMEN RETRIEVAL 10MM (ENDOMECHANICALS) ×2 IMPLANT
SCISSORS LAP 5X45 EPIX DISP (ENDOMECHANICALS) ×2 IMPLANT
SCRUB PCMX 4 OZ (MISCELLANEOUS) ×2 IMPLANT
SET IRRIG TUBING LAPAROSCOPIC (IRRIGATION / IRRIGATOR) ×2 IMPLANT
SLEEVE XCEL OPT CAN 5 100 (ENDOMECHANICALS) ×4 IMPLANT
STAPLER VISISTAT 35W (STAPLE) ×2 IMPLANT
STRIP CLOSURE SKIN 1/2X4 (GAUZE/BANDAGES/DRESSINGS) IMPLANT
SUT VIC AB 4-0 SH 18 (SUTURE) ×2 IMPLANT
SYR 20CC LL (SYRINGE) ×2 IMPLANT
TOWEL OR 17X26 10 PK STRL BLUE (TOWEL DISPOSABLE) ×2 IMPLANT
TRAY LAPAROSCOPIC (CUSTOM PROCEDURE TRAY) ×2 IMPLANT
TROCAR BLADELESS OPT 5 100 (ENDOMECHANICALS) ×2 IMPLANT
TROCAR XCEL BLUNT TIP 100MML (ENDOMECHANICALS) IMPLANT
TROCAR XCEL NON-BLD 11X100MML (ENDOMECHANICALS) ×2 IMPLANT

## 2014-08-16 NOTE — Anesthesia Preprocedure Evaluation (Signed)
Anesthesia Evaluation  Patient identified by MRN, date of birth, ID band Patient awake    Reviewed: Allergy & Precautions, NPO status , Patient's Chart, lab work & pertinent test results  Airway Mallampati: II  TM Distance: >3 FB Neck ROM: Full    Dental  (+) Dental Advisory Given   Pulmonary neg pulmonary ROS,  breath sounds clear to auscultation        Cardiovascular hypertension, Rhythm:Regular Rate:Normal     Neuro/Psych negative neurological ROS     GI/Hepatic Neg liver ROS, hiatal hernia, GERD-  ,  Endo/Other  diabetesMorbid obesityHypokalemia   Renal/GU negative Renal ROS     Musculoskeletal   Abdominal   Peds  Hematology  (+) anemia ,   Anesthesia Other Findings   Reproductive/Obstetrics                             Anesthesia Physical Anesthesia Plan  ASA: III  Anesthesia Plan: General   Post-op Pain Management:    Induction: Intravenous  Airway Management Planned: Oral ETT  Additional Equipment:   Intra-op Plan:   Post-operative Plan: Extubation in OR  Informed Consent: I have reviewed the patients History and Physical, chart, labs and discussed the procedure including the risks, benefits and alternatives for the proposed anesthesia with the patient or authorized representative who has indicated his/her understanding and acceptance.   Dental advisory given  Plan Discussed with: CRNA  Anesthesia Plan Comments:         Anesthesia Quick Evaluation

## 2014-08-16 NOTE — Op Note (Signed)
Sharmaine Base @date @  Procedure: Laparoscopic Cholecystectomy with intraoperative cholangiogram  Surgeon: Kaylyn Lim, MD, FACS Asst:  Servando Snare, RNFA  Anes:  General  Drains: None  Findings: Recent pancreatitis with saponification of fat and white flecks, normal IOC  Description of Procedure: The patient was taken to OR 6 and given general anesthesia.  The patient was prepped with PCMX and draped sterilely. A time out was performed.  Access to the abdomen was achieved with a 5 mm Optiview through the right upper quadrant.  Port placement included two other 5 mm trocars and an 11 mm in the upper midline.  Dense adhesions were noted in the site of her hernia repair with mesh that got infected in 2010.    The gallbladder was visualized and the fundus was grasped and the gallbladder was elevated. Traction on the infundibulum allowed for successful demonstration of the critical view. Inflammatory changes were minimal.  The cystic duct was identified and clipped up on the gallbladder and an incision was made in the cystic duct and the Reddick catheter was inserted after milking the cystic duct of any debris. A dynamic cholangiogram was performed which demonstrated filling of a small CBD with intrahepatic radicals and free flow into the duodenum.  No real pancreatic duct filling was seen. .    The cystic duct was then triple clipped and divided, the cystic artery was double clipped and divided and then the gallbladder was removed from the gallbladder bed. Removal of the gallbladder from the gallbladder bed was without difficulty.  Some bile duct drainage occurred at the top of the GB removal but this was minimal and contained.  The gallbladder was then placed in a bag and brought out through one of the 10 mm trocar sites. The gallbladder bed was inspected and no bleeding or bile leaks were seen.   Laparoscopic visualization was used when closing fascial defects for trocar sites.   Incisions were  injected with lidocaine and closed with 4-0 Vicryl and  Liquiban on the skin.  Sponge and needle count were correct. .    The patient was taken to the recovery room in satisfactory condition.

## 2014-08-16 NOTE — Anesthesia Postprocedure Evaluation (Signed)
  Anesthesia Post-op Note  Patient: Angela Harvey  Procedure(s) Performed: Procedure(s): LAPAROSCOPIC CHOLECYSTECTOMY WITH INTRAOPERATIVE CHOLANGIOGRAM (N/A)  Patient Location: PACU  Anesthesia Type:General  Level of Consciousness: awake and alert   Airway and Oxygen Therapy: Patient Spontanous Breathing  Post-op Pain: none  Post-op Assessment: Post-op Vital signs reviewed  Post-op Vital Signs: Reviewed  Last Vitals:  Filed Vitals:   08/16/14 1204  BP: 139/59  Pulse: 66  Temp: 36.6 C  Resp: 18    Complications: No apparent anesthesia complications

## 2014-08-16 NOTE — Transfer of Care (Signed)
Immediate Anesthesia Transfer of Care Note  Patient: Angela Harvey  Procedure(s) Performed: Procedure(s): LAPAROSCOPIC CHOLECYSTECTOMY WITH INTRAOPERATIVE CHOLANGIOGRAM (N/A)  Patient Location: PACU  Anesthesia Type:General  Level of Consciousness: awake, alert  and oriented  Airway & Oxygen Therapy: Patient Spontanous Breathing and Patient connected to face mask oxygen  Post-op Assessment: Report given to RN and Post -op Vital signs reviewed and stable  Post vital signs: Reviewed and stable  Last Vitals:  Filed Vitals:   08/16/14 0500  BP: 163/66  Pulse: 76  Temp: 36.7 C  Resp: 20    Complications: No apparent anesthesia complications

## 2014-08-16 NOTE — Anesthesia Procedure Notes (Signed)
Procedure Name: Intubation Date/Time: 08/16/2014 10:50 AM Performed by: Noralyn Pick Pre-anesthesia Checklist: Patient identified, Emergency Drugs available, Suction available and Patient being monitored Patient Re-evaluated:Patient Re-evaluated prior to inductionOxygen Delivery Method: Circle System Utilized Preoxygenation: Pre-oxygenation with 100% oxygen Intubation Type: IV induction Ventilation: Mask ventilation without difficulty Laryngoscope Size: Mac and 3 Grade View: Grade II Tube type: Oral Tube size: 7.5 mm Number of attempts: 1 Airway Equipment and Method: Stylet and Oral airway Placement Confirmation: ETT inserted through vocal cords under direct vision,  positive ETCO2 and breath sounds checked- equal and bilateral Secured at: 20 cm Tube secured with: Tape Dental Injury: Teeth and Oropharynx as per pre-operative assessment

## 2014-08-16 NOTE — Progress Notes (Signed)
Patient ID: Angela Harvey, female   DOB: 04-22-1951, 64 y.o.   MRN: [id number]    Subjective: Pt feels well today except for diarrhea.  No abdominal pain  Objective: Vital signs in last 24 hours: Temp:  [98 F (36.7 C)-99.2 F (37.3 C)] 98 F (36.7 C) (03/07 0500) Pulse Rate:  [76-85] 76 (03/07 0500) Resp:  [16-20] 20 (03/07 0500) BP: (153-163)/(66-73) 163/66 mmHg (03/07 0500) SpO2:  [98 %-100 %] 98 % (03/07 0500) Last BM Date: 08/11/14  Intake/Output from previous day: 03/06 0701 - 03/07 0700 In: 1997.5 [I.V.:1747.5; IV Piggyback:250] Out: -  Intake/Output this shift:    PE: Abd: soft, NT, ND, obese, some BS Heart: regular LungS: CTAB  Lab Results:   Recent Labs  08/15/14 0510 08/16/14 0525  WBC 11.0* 11.9*  HGB 9.3* 9.7*  HCT 27.2* 28.3*  PLT 345 439*   BMET  Recent Labs  08/15/14 0510 08/16/14 0525  NA 138 140  K 4.0 2.8*  CL 111 111  CO2 25 21  GLUCOSE 174* 117*  BUN 12 9  CREATININE 0.56 0.58  CALCIUM 7.9* 7.5*   PT/INR No results for input(s): LABPROT, INR in the last 72 hours. CMP     Component Value Date/Time   NA 140 08/16/2014 0525   K 2.8* 08/16/2014 0525   CL 111 08/16/2014 0525   CO2 21 08/16/2014 0525   GLUCOSE 117* 08/16/2014 0525   BUN 9 08/16/2014 0525   CREATININE 0.58 08/16/2014 0525   CALCIUM 7.5* 08/16/2014 0525   PROT 5.5* 08/16/2014 0525   ALBUMIN 2.3* 08/16/2014 0525   AST 40* 08/16/2014 0525   ALT 39* 08/16/2014 0525   ALKPHOS 183* 08/16/2014 0525   BILITOT 0.6 08/16/2014 0525   GFRNONAA >90 08/16/2014 0525   GFRAA >90 08/16/2014 0525   Lipase     Component Value Date/Time   LIPASE 95* 08/15/2014 0510       Studies/Results: US Abdomen Limited Ruq  08/14/2014   CLINICAL DATA:  Acute pancreatitis.  EXAM: US ABDOMEN LIMITED - RIGHT UPPER QUADRANT  COMPARISON:  None.  FINDINGS: Gallbladder:  Gallbladder sludge seen as well as tiny less than 5 mm gallstones. No evidence of gallbladder wall thickening. No  sonographic Murphy sign noted by sonographer.  Common bile duct:  Diameter: 3 mm, which is within normal limits.  Liver:  Diffusely increased echogenicity of the hepatic parenchyma, consistent with diffuse hepatic steatosis/hepatocellular disease. No focal mass lesion identified.  IMPRESSION: Cholelithiasis, without definite sonographic signs of acute cholecystitis or biliary dilatation.  Diffuse hepatic steatosis/hepatocellular disease.   Electronically Signed   By: Earle Gell M.D.   On: 08/14/2014 13:03    Anti-infectives: Anti-infectives    None       Assessment/Plan  1. Gallstone pancreatitis 2. Prior mini gastric bypass/incisional hernia repair with mesh 3. Diarrhea 4. Hypokalemia  Plan: 1.  Will add some K to her IVFs and give her 40 meq orally pre-op in plans for surgery today. 2. I have discussed the surgery with the patient.  She is agreeable to proceed to the OR today.  Her lipase has normalized and she is asymptomatic. 3. Given OR today, if the patient does well, we can try her on clear liquids post op.  May want to hold off on placement of a PICC and TNA so if she does well with surgery, this may be moot.     LOS: 8 days    OSBORNE,KELLY E 08/16/2014, 8:17 AM Pager:  [phone] I have seen and discussed lap chole with her.  She has had a prior infected mesh placement by Dr. Evorn Gong that may complicate our procedure.  She is aware of risks.  Will proceed with lap chole probable IOC  Matt B. Hassell Done, MD, [facility] [phone] beeper [phone]  08/16/2014 9:41 AM

## 2014-08-17 ENCOUNTER — Encounter (HOSPITAL_COMMUNITY): Payer: Self-pay | Admitting: Surgery

## 2014-08-17 LAB — COMPREHENSIVE METABOLIC PANEL
ALT: 33 U/L (ref 0–35)
AST: 35 U/L (ref 0–37)
Albumin: 2.1 g/dL — ABNORMAL LOW (ref 3.5–5.2)
Alkaline Phosphatase: 173 U/L — ABNORMAL HIGH (ref 39–117)
Anion gap: 8 (ref 5–15)
BUN: 10 mg/dL (ref 6–23)
CO2: 21 mmol/L (ref 19–32)
Calcium: 7.8 mg/dL — ABNORMAL LOW (ref 8.4–10.5)
Chloride: 107 mmol/L (ref 96–112)
Creatinine, Ser: 0.56 mg/dL (ref 0.50–1.10)
GFR calc Af Amer: >90 mL/min (ref >90)
GFR calc non Af Amer: >90 mL/min (ref >90)
Glucose, Bld: 189 mg/dL — ABNORMAL HIGH (ref 70–99)
Potassium: 3.1 mmol/L — ABNORMAL LOW (ref 3.5–5.1)
Sodium: 136 mmol/L (ref 135–145)
Total Bilirubin: 0.3 mg/dL (ref 0.3–1.2)
Total Protein: 5.1 g/dL — ABNORMAL LOW (ref 6.0–8.3)

## 2014-08-17 LAB — CBC
HCT: 26.1 % — ABNORMAL LOW (ref 36.0–46.0)
Hemoglobin: 9 g/dL — ABNORMAL LOW (ref 12.0–15.0)
MCH: 31 pg (ref 26.0–34.0)
MCHC: 34.5 g/dL (ref 30.0–36.0)
MCV: 90 fL (ref 78.0–100.0)
Platelets: 518 10*3/uL — ABNORMAL HIGH (ref 150–400)
RBC: 2.9 MIL/uL — ABNORMAL LOW (ref 3.87–5.11)
RDW: 14.1 % (ref 11.5–15.5)
WBC: 12.7 10*3/uL — ABNORMAL HIGH (ref 4.0–10.5)

## 2014-08-17 LAB — GLUCOSE, CAPILLARY
Glucose-Capillary: 158 mg/dL — ABNORMAL HIGH (ref 70–99)
Glucose-Capillary: 168 mg/dL — ABNORMAL HIGH (ref 70–99)
Glucose-Capillary: 178 mg/dL — ABNORMAL HIGH (ref 70–99)
Glucose-Capillary: 211 mg/dL — ABNORMAL HIGH (ref 70–99)

## 2014-08-17 MED ORDER — ONDANSETRON HCL 4 MG PO TABS: 4.0000 mg | ORAL_TABLET | Freq: Four times a day (QID) | ORAL | Status: DC | PRN

## 2014-08-17 MED ORDER — POTASSIUM CHLORIDE CRYS ER 20 MEQ PO TBCR
40.0000 meq | EXTENDED_RELEASE_TABLET | Freq: Two times a day (BID) | ORAL | Status: DC
  Administered 2014-08-17: 40 meq via ORAL
  Filled 2014-08-17: qty 2

## 2014-08-17 MED ORDER — ALUM & MAG HYDROXIDE-SIMETH 200-200-20 MG/5ML PO SUSP: 30.0000 mL | ORAL | Status: DC | PRN

## 2014-08-17 MED ORDER — PANTOPRAZOLE SODIUM 40 MG PO TBEC: 40.0000 mg | DELAYED_RELEASE_TABLET | Freq: Every day | ORAL | Status: DC

## 2014-08-17 MED ORDER — PANTOPRAZOLE SODIUM 40 MG PO TBEC
40.0000 mg | DELAYED_RELEASE_TABLET | Freq: Every day | ORAL | Status: DC
  Administered 2014-08-17: 40 mg via ORAL
  Filled 2014-08-17: qty 1

## 2014-08-17 NOTE — Progress Notes (Signed)
Patient seen, surgery note reviewed.  Per nursing, diet advancing and they will update [place] later regarding tolerance for possible disposition.  Happy to handle surgery PA's post-op disposition and discharge.

## 2014-08-17 NOTE — Progress Notes (Signed)
Patient ID: Angela Harvey, female   DOB: 03-15-1951, 64 y.o.   MRN: [id number] 1 Day Post-Op  Subjective: Pt feels better today, looks better today.  Tolerating her liquids, but having reflux which is causing her a little nausea.  Objective: Vital signs in last 24 hours: Temp:  [97.5 F (36.4 C)-99.6 F (37.6 C)] 98.8 F (37.1 C) (03/08 0527) Pulse Rate:  [66-91] 91 (03/08 0527) Resp:  [15-20] 18 (03/08 0527) BP: (115-197)/(49-74) 166/62 mmHg (03/08 0527) SpO2:  [94 %-100 %] 94 % (03/08 0527) Last BM Date: 08/16/14  Intake/Output from previous day: 03/07 0701 - 03/08 0700 In: 2876.7 [P.O.:240; I.V.:2586.7; IV Piggyback:50] Out: 750 [Urine:750] Intake/Output this shift:    PE: Abd: soft, minimally tender, +BS, ND, obese, incisions c/d/i  Lab Results:   Recent Labs  08/16/14 0525 08/17/14 0605  WBC 11.9* 12.7*  HGB 9.7* 9.0*  HCT 28.3* 26.1*  PLT 439* 518*   BMET  Recent Labs  08/16/14 0525 08/17/14 0605  NA 140 136  K 2.8* 3.1*  CL 111 107  CO2 21 21  GLUCOSE 117* 189*  BUN 9 10  CREATININE 0.58 0.56  CALCIUM 7.5* 7.8*   PT/INR No results for input(s): LABPROT, INR in the last 72 hours. CMP     Component Value Date/Time   NA 136 08/17/2014 0605   K 3.1* 08/17/2014 0605   CL 107 08/17/2014 0605   CO2 21 08/17/2014 0605   GLUCOSE 189* 08/17/2014 0605   BUN 10 08/17/2014 0605   CREATININE 0.56 08/17/2014 0605   CALCIUM 7.8* 08/17/2014 0605   PROT 5.1* 08/17/2014 0605   ALBUMIN 2.1* 08/17/2014 0605   AST 35 08/17/2014 0605   ALT 33 08/17/2014 0605   ALKPHOS 173* 08/17/2014 0605   BILITOT 0.3 08/17/2014 0605   GFRNONAA >90 08/17/2014 0605   GFRAA >90 08/17/2014 0605   Lipase     Component Value Date/Time   LIPASE 95* 08/15/2014 0510       Studies/Results: Dg Cholangiogram Operative  08/16/2014   CLINICAL DATA:  Gallstones  EXAM: INTRAOPERATIVE CHOLANGIOGRAM  TECHNIQUE: Cholangiographic images from the C-arm fluoroscopic device were  submitted for interpretation post-operatively. Please see the procedural report for the amount of contrast and the fluoroscopy time utilized.  COMPARISON:  None.  FINDINGS: Contrast fills the biliary tree and duodenum without filling defects in the common bile duct.  IMPRESSION: Patent biliary tree without evidence of common bile duct stones.   Electronically Signed   By: Marybelle Killings M.D.   On: 08/16/2014 10:51    Anti-infectives: Anti-infectives    Start     Dose/Rate Route Frequency Ordered Stop   08/16/14 1600  ceFAZolin (ANCEF) IVPB 1 g/50 mL premix     1 g 100 mL/hr over 30 Minutes Intravenous Every 6 hours 08/16/14 1210 08/17/14 0449   08/16/14 1015  ceFAZolin (ANCEF) IVPB 2 g/50 mL premix     2 g 100 mL/hr over 30 Minutes Intravenous  Once 08/16/14 1011 08/16/14 0952       Assessment/Plan  1. POD 1, s/p lap chole -patient doing well this morning.  Tolerating clear liquids.  Advance as tolerates -oral pain meds -add protonix and maalox for reflux -patient stable for dc home if she is tolerating a diet and oral pain meds -DC instructions have been placed in the DC instruction section 2. Gallstone pancreatitis 3. Hypokalemia -replace K today.  Further dispo per primary service    LOS: 9 days  Shiven Junious E 08/17/2014, 8:19 AM Pager: [phone]

## 2014-08-17 NOTE — Discharge Instructions (Signed)
[facility] ______[facility] °LAPAROSCOPIC SURGERY: POST OP INSTRUCTIONS °Always review your discharge instruction sheet given to you by the facility where your surgery was performed. °IF YOU HAVE DISABILITY OR FAMILY LEAVE FORMS, YOU MUST BRING THEM TO THE OFFICE FOR PROCESSING.   °DO NOT GIVE THEM TO YOUR DOCTOR. ° °1. A prescription for pain medication may be given to you upon discharge.  Take your pain medication as prescribed, if needed.  If narcotic pain medicine is not needed, then you may take acetaminophen (Tylenol) or ibuprofen (Advil) as needed. °2. Take your usually prescribed medications unless otherwise directed. °3. If you need a refill on your pain medication, please contact your pharmacy.  They will contact our office to request authorization. Prescriptions will not be filled after 5pm or on week-ends. °4. You should follow a light diet the first few days after arrival home, such as soup and crackers, etc.  Be sure to include lots of fluids daily. °5. Most patients will experience some swelling and bruising in the area of the incisions.  Ice packs will help.  Swelling and bruising can take several days to resolve.  °6. It is common to experience some constipation if taking pain medication after surgery.  Increasing fluid intake and taking a stool softener (such as Colace) will usually help or prevent this problem from occurring.  A mild laxative (Milk of Magnesia or Miralax) should be taken according to package instructions if there are no bowel movements after 48 hours. °7. Unless discharge instructions indicate otherwise, you may remove your bandages 24-48 hours after surgery, and you may shower at that time.  You may have steri-strips (small skin tapes) in place directly over the incision.  These strips should be left on the skin for 7-10 days.  If your surgeon used skin glue on the incision, you may shower in 24 hours.  The glue will flake off over the next 2-3 weeks.  Any sutures or  staples will be removed at the office during your follow-up visit. °8. ACTIVITIES:  You may resume regular (light) daily activities beginning the next day--such as daily self-care, walking, climbing stairs--gradually increasing activities as tolerated.  You may have sexual intercourse when it is comfortable.  Refrain from any heavy lifting or straining until approved by your doctor. °a. You may drive when you are no longer taking prescription pain medication, you can comfortably wear a seatbelt, and you can safely maneuver your car and apply brakes. °b. RETURN TO WORK:  __________________________________________________________ °9. You should see your doctor in the office for a follow-up appointment approximately 2-3 weeks after your surgery.  Make sure that you call for this appointment within a day or two after you arrive home to insure a convenient appointment time. °10. OTHER INSTRUCTIONS: __________________________________________________________________________________________________________________________ __________________________________________________________________________________________________________________________ °WHEN TO CALL YOUR DOCTOR: °1. Fever over 101.0 °2. Inability to urinate °3. Continued bleeding from incision. °4. Increased pain, redness, or drainage from the incision. °5. Increasing abdominal pain ° °The clinic staff is available to answer your questions during regular business hours.  Please don’t hesitate to call and ask to speak to one of the nurses for clinical concerns.  If you have a medical emergency, go to the nearest emergency room or call 911.  A surgeon from [facility] is always on call at the hospital. °[address], [address], [place], [place]  [address] ? [address] [address], [place], [place]   [address] °[phone] ? [phone] ? FAX [phone] °Web site:   [email or web address] °

## 2014-08-17 NOTE — Discharge Summary (Signed)
DISCHARGE SUMMARY  Angela Harvey  MR#: [id number]  DOB:November 14, 1950  Date of Admission: 08/08/2014 Date of Discharge: 08/17/2014  Attending Physician:Cadan Maggart A  Patient's NLZ:JQBHALP,FXTKWIO A, MD  Consults:Treatment Team:  Lear Ng, MD Md C[facility], MD Missy Sabins, MD  ccs  Discharge Diagnoses: Active Problems:   Acute biliary pancreatitis   Essential hypertension   Diabetes mellitus, type II   GERD (gastroesophageal reflux disease)   Severe protein-calorie malnutrition   Malnutrition of moderate degree   Chololithiasis   Gall stone pancreatitis  Discharge Medications:   Medication List    STOP taking these medications        celecoxib 200 MG capsule  Commonly known as:  CELEBREX     KRILL OIL PO      TAKE these medications        metFORMIN 500 MG tablet  Commonly known as:  GLUCOPHAGE  Take 500 mg by mouth 2 (two) times daily.     metoCLOPramide 10 MG tablet  Commonly known as:  REGLAN  Take 10 mg by mouth 3 (three) times daily.     multivitamin with minerals Tabs tablet  Take 1 tablet by mouth daily.     multivitamin-lutein Caps capsule  Take 1 capsule by mouth daily.     ondansetron 4 MG tablet  Commonly known as:  ZOFRAN  Take 1 tablet (4 mg total) by mouth every 6 (six) hours as needed for nausea.     pantoprazole 40 MG tablet  Commonly known as:  PROTONIX  Take 1 tablet (40 mg total) by mouth daily.     sertraline 100 MG tablet  Commonly known as:  ZOLOFT  Take 100 mg by mouth daily.        Hospital Procedures: Dg Cholangiogram Operative  08/16/2014   CLINICAL DATA:  Gallstones  EXAM: INTRAOPERATIVE CHOLANGIOGRAM  TECHNIQUE: Cholangiographic images from the C-arm fluoroscopic device were submitted for interpretation post-operatively. Please see the procedural report for the amount of contrast and the fluoroscopy time utilized.  COMPARISON:  None.  FINDINGS: Contrast fills the biliary tree and duodenum without filling defects in  the common bile duct.  IMPRESSION: Patent biliary tree without evidence of common bile duct stones.   Electronically Signed   By: Marybelle Killings M.D.   On: 08/16/2014 10:51   Ct Abdomen W Contrast  08/10/2014   CLINICAL DATA:  Acute pancreatitis.  Abdominal pain, nausea.  EXAM: CT ABDOMEN WITH CONTRAST  TECHNIQUE: Multidetector CT imaging of the abdomen was performed using the standard protocol following bolus administration of intravenous contrast.  CONTRAST:  12mL OMNIPAQUE IOHEXOL 300 MG/ML  SOLN  COMPARISON:  08/08/2014  FINDINGS: Scarring or atelectasis in the lingula and in the lung bases. No effusions. Heart is normal size.  Postoperative changes in the region of the stomach. There is stranding/inflammation noted around the pancreas diffusely compatible with acute pancreatitis. The pancreas appears to be enhancing normally. Amount of peripancreatic inflammation is similar to prior study. Acute fluid collections within the left anterior para renal space has decreased.  Portal vein and splenic vein are patent. Diffuse fatty infiltration of the liver. Spleen, adrenals and kidneys are unremarkable.  Visualized large and small bowel are unremarkable.  IMPRESSION: Continued inflammation/ stranding around the pancreas, similar to prior study. The fluid in the left anterior para renal space has decreased.   Electronically Signed   By: Rolm Baptise M.D.   On: 08/10/2014 15:31   Ct Abdomen Pelvis W Contrast  08/08/2014  ADDENDUM REPORT: 08/08/2014 17:51  ADDENDUM: Typographical error within the impression. The first sentence should read  Edematous appearing pancreas with extensive stranding and fluid around the pancreas and in the left anterior para renal space compatible with acute pancreatitis.   Electronically Signed   By: Rolm Baptise M.D.   On: 08/08/2014 17:51   08/08/2014   CLINICAL DATA:  Generalized abdominal pain beginning last night. Nausea, vomiting.  EXAM: CT ABDOMEN AND PELVIS WITH CONTRAST   TECHNIQUE: Multidetector CT imaging of the abdomen and pelvis was performed using the standard protocol following bolus administration of intravenous contrast.  CONTRAST:  128mL OMNIPAQUE IOHEXOL 300 MG/ML  SOLN  COMPARISON:  12/11/2012  FINDINGS: The distal scratch head the visualized distal esophagus is fluid-filled and dilated, possibly related to reflux. Lung bases are clear. No effusions. Heart is normal size.  Postoperative changes in the stomach. There is stranding noted around the pancreas with fluid in the left anterior para renal space and inferior to the pancreas compatible with acute pancreatitis. Pancreas looks prominent/edematous. No focal abnormality.  Mild diffuse fatty infiltration of the liver. Gallbladder, spleen, adrenals and kidneys are normal. Large and small bowel are unremarkable. There is free fluid in the cul-de-sac of the pelvis. Prior hysterectomy. No adnexal masses. Urinary bladder is unremarkable.  Degenerative changes in the lower lumbar spine. No acute bony abnormality or focal bone lesion.  IMPRESSION: Edematous appearing pancreas with extensive stranding and fluid around the pancreas and in the left anterior para renal space compatible with acute appendicitis. Dilated, fluid-filled distal esophagus, presumably related to reflux. Postoperative changes in the region of the stomach.  Mild diffuse fatty infiltration of the liver.  Electronically Signed: By: Rolm Baptise M.D. On: 08/08/2014 17:33   US Abdomen Limited Ruq  08/14/2014   CLINICAL DATA:  Acute pancreatitis.  EXAM: US ABDOMEN LIMITED - RIGHT UPPER QUADRANT  COMPARISON:  None.  FINDINGS: Gallbladder:  Gallbladder sludge seen as well as tiny less than 5 mm gallstones. No evidence of gallbladder wall thickening. No sonographic Murphy sign noted by sonographer.  Common bile duct:  Diameter: 3 mm, which is within normal limits.  Liver:  Diffusely increased echogenicity of the hepatic parenchyma, consistent with diffuse hepatic  steatosis/hepatocellular disease. No focal mass lesion identified.  IMPRESSION: Cholelithiasis, without definite sonographic signs of acute cholecystitis or biliary dilatation.  Diffuse hepatic steatosis/hepatocellular disease.   Electronically Signed   By: Earle Gell M.D.   On: 08/14/2014 13:03    History of Present Illness:  Angela Harvey is a 64 y.o. female with a history of diabetes mellitus type 2, hypertension, and hiatal hernia who presents the emergency department complaining of nausea and vomiting ongoing for 1 week with severe abdominal pain generalized her abdomen since last night. She reports vomiting twice today. She was taking 5 Tums a day to try to get her pain under control.. She reports nausea even with drinking water. She denies alcohol intake. She denies history of pancreatitis, but has an elevated Lipase and an abdominal CT which confirms this diagnosis. She will be admitted for further management. Hospital Course: Admitted, bowel rested, ivf provided.  While initially improved, course declined resulting in gb US demonstrating gallstones not seen on ct.  Patient underwent lap chole by [facility]--uncomplicated.  Diet was advanced and patient is discharged tolerating pos well with no residual n/v/pain according to verbal nursing update this afternoon.  Day of Discharge Exam BP 139/61 mmHg  Pulse 82  Temp(Src) 98.5 F (36.9  C) (Oral)  Resp 18  Ht 5\' 3"  (1.6 m)  Wt 89.1 kg (196 lb 6.9 oz)  BMI 34.80 kg/m2  SpO2 96%  Physical Exam: General appearance: alert, cooperative and no distress Eyes: no scleral icterus Throat: oropharynx moist without erythema Resp: clear to auscultation bilaterally Cardio: regular rate and rhythm, S1, S2 normal, no murmur, click, rub or gallop Extremities: no clubbing, cyanosis or edema Abdomen soft, nontender, obese, good bs Neuro normal  Discharge Labs:  Recent Labs  08/15/14 0510 08/16/14 0525 08/17/14 0605  NA 138 140 136  K 4.0 2.8* 3.1*   CL 111 111 107  CO2 25 21 21   GLUCOSE 174* 117* 189*  BUN 12 9 10   CREATININE 0.56 0.58 0.56  CALCIUM 7.9* 7.5* 7.8*  MG 1.1* 1.7  --   PHOS 1.1* 3.4  --     Recent Labs  08/16/14 0525 08/17/14 0605  AST 40* 35  ALT 39* 33  ALKPHOS 183* 173*  BILITOT 0.6 0.3  PROT 5.5* 5.1*  ALBUMIN 2.3* 2.1*    Recent Labs  08/16/14 0525 08/17/14 0605  WBC 11.9* 12.7*  NEUTROABS 9.4*  --   HGB 9.7* 9.0*  HCT 28.3* 26.1*  MCV 89.8 90.0  PLT 439* 518*   No results for input(s): CKTOTAL, CKMB, CKMBINDEX, TROPONINI in the last 72 hours. No results for input(s): TSH, T4TOTAL, T3FREE, THYROIDAB in the last 72 hours.  Invalid input(s): FREET3 No results for input(s): VITAMINB12, FOLATE, FERRITIN, TIBC, IRON, RETICCTPCT in the last 72 hours.  Discharge instructions:     Discharge Instructions    Diet - low sodium heart healthy    Complete by:  As directed      Increase activity slowly    Complete by:  As directed            Disposition: home with husband  Follow-up Appts: Follow-up with Dr. Reynaldo Minium at [facility] in 1 week.  Call for appointment.  Condition on Discharge: improved  Tests Needing Follow-up: none  Signed: Ayelet Gruenewald A 08/17/2014, 3:31 PM

## 2014-08-17 NOTE — Progress Notes (Signed)
Patient is alert and oriented, vital signs are stable, incisions are within normal limits, patient is tolerating pain adequately, patient is tolerating advanced diet, will continue Neta Mends RN 08-17-2014 4:18 PM

## 2014-09-22 DIAGNOSIS — H4011X1 Primary open-angle glaucoma, mild stage: Secondary | ICD-10-CM | POA: Diagnosis not present

## 2014-10-21 DIAGNOSIS — E669 Obesity, unspecified: Secondary | ICD-10-CM | POA: Diagnosis not present

## 2014-10-21 DIAGNOSIS — M1990 Unspecified osteoarthritis, unspecified site: Secondary | ICD-10-CM | POA: Diagnosis not present

## 2014-10-21 DIAGNOSIS — Z1389 Encounter for screening for other disorder: Secondary | ICD-10-CM | POA: Diagnosis not present

## 2014-10-21 DIAGNOSIS — K219 Gastro-esophageal reflux disease without esophagitis: Secondary | ICD-10-CM | POA: Diagnosis not present

## 2014-10-21 DIAGNOSIS — E1165 Type 2 diabetes mellitus with hyperglycemia: Secondary | ICD-10-CM | POA: Diagnosis not present

## 2014-10-21 DIAGNOSIS — F418 Other specified anxiety disorders: Secondary | ICD-10-CM | POA: Diagnosis not present

## 2014-10-21 DIAGNOSIS — I10 Essential (primary) hypertension: Secondary | ICD-10-CM | POA: Diagnosis not present

## 2014-10-21 DIAGNOSIS — Z6832 Body mass index (BMI) 32.0-32.9, adult: Secondary | ICD-10-CM | POA: Diagnosis not present

## 2014-10-28 DIAGNOSIS — H4011X1 Primary open-angle glaucoma, mild stage: Secondary | ICD-10-CM | POA: Diagnosis not present

## 2014-11-09 DIAGNOSIS — I10 Essential (primary) hypertension: Secondary | ICD-10-CM | POA: Diagnosis not present

## 2014-11-09 DIAGNOSIS — E1165 Type 2 diabetes mellitus with hyperglycemia: Secondary | ICD-10-CM | POA: Diagnosis not present

## 2014-11-09 DIAGNOSIS — Z6833 Body mass index (BMI) 33.0-33.9, adult: Secondary | ICD-10-CM | POA: Diagnosis not present

## 2014-12-17 DIAGNOSIS — Z6834 Body mass index (BMI) 34.0-34.9, adult: Secondary | ICD-10-CM | POA: Diagnosis not present

## 2014-12-17 DIAGNOSIS — E1165 Type 2 diabetes mellitus with hyperglycemia: Secondary | ICD-10-CM | POA: Diagnosis not present

## 2015-01-31 DIAGNOSIS — E1165 Type 2 diabetes mellitus with hyperglycemia: Secondary | ICD-10-CM | POA: Diagnosis not present

## 2015-01-31 DIAGNOSIS — I10 Essential (primary) hypertension: Secondary | ICD-10-CM | POA: Diagnosis not present

## 2015-02-16 DIAGNOSIS — E119 Type 2 diabetes mellitus without complications: Secondary | ICD-10-CM | POA: Diagnosis not present

## 2015-02-16 DIAGNOSIS — Z23 Encounter for immunization: Secondary | ICD-10-CM | POA: Diagnosis not present

## 2015-02-16 DIAGNOSIS — F418 Other specified anxiety disorders: Secondary | ICD-10-CM | POA: Diagnosis not present

## 2015-02-16 DIAGNOSIS — E669 Obesity, unspecified: Secondary | ICD-10-CM | POA: Diagnosis not present

## 2015-02-16 DIAGNOSIS — I10 Essential (primary) hypertension: Secondary | ICD-10-CM | POA: Diagnosis not present

## 2015-02-16 DIAGNOSIS — M1990 Unspecified osteoarthritis, unspecified site: Secondary | ICD-10-CM | POA: Diagnosis not present

## 2015-02-16 DIAGNOSIS — Z0000 Encounter for general adult medical examination without abnormal findings: Secondary | ICD-10-CM | POA: Diagnosis not present

## 2015-02-16 DIAGNOSIS — Z6836 Body mass index (BMI) 36.0-36.9, adult: Secondary | ICD-10-CM | POA: Diagnosis not present

## 2015-02-21 DIAGNOSIS — Z6836 Body mass index (BMI) 36.0-36.9, adult: Secondary | ICD-10-CM | POA: Diagnosis not present

## 2015-02-21 DIAGNOSIS — R51 Headache: Secondary | ICD-10-CM | POA: Diagnosis not present

## 2015-02-21 DIAGNOSIS — I10 Essential (primary) hypertension: Secondary | ICD-10-CM | POA: Diagnosis not present

## 2015-02-21 DIAGNOSIS — B029 Zoster without complications: Secondary | ICD-10-CM | POA: Diagnosis not present

## 2015-02-21 DIAGNOSIS — R42 Dizziness and giddiness: Secondary | ICD-10-CM | POA: Diagnosis not present

## 2015-02-23 DIAGNOSIS — G319 Degenerative disease of nervous system, unspecified: Secondary | ICD-10-CM | POA: Diagnosis not present

## 2015-02-23 DIAGNOSIS — R51 Headache: Secondary | ICD-10-CM | POA: Diagnosis not present

## 2015-02-23 DIAGNOSIS — R42 Dizziness and giddiness: Secondary | ICD-10-CM | POA: Diagnosis not present

## 2015-04-12 DIAGNOSIS — E113291 Type 2 diabetes mellitus with mild nonproliferative diabetic retinopathy without macular edema, right eye: Secondary | ICD-10-CM | POA: Diagnosis not present

## 2015-04-12 DIAGNOSIS — H524 Presbyopia: Secondary | ICD-10-CM | POA: Diagnosis not present

## 2015-04-12 DIAGNOSIS — H2513 Age-related nuclear cataract, bilateral: Secondary | ICD-10-CM | POA: Diagnosis not present

## 2015-04-12 DIAGNOSIS — H401111 Primary open-angle glaucoma, right eye, mild stage: Secondary | ICD-10-CM | POA: Diagnosis not present

## 2015-06-30 DIAGNOSIS — Z6841 Body Mass Index (BMI) 40.0 and over, adult: Secondary | ICD-10-CM | POA: Diagnosis not present

## 2015-06-30 DIAGNOSIS — M5416 Radiculopathy, lumbar region: Secondary | ICD-10-CM | POA: Diagnosis not present

## 2015-06-30 DIAGNOSIS — M1990 Unspecified osteoarthritis, unspecified site: Secondary | ICD-10-CM | POA: Diagnosis not present

## 2015-06-30 DIAGNOSIS — R42 Dizziness and giddiness: Secondary | ICD-10-CM | POA: Diagnosis not present

## 2015-06-30 DIAGNOSIS — J309 Allergic rhinitis, unspecified: Secondary | ICD-10-CM | POA: Diagnosis not present

## 2015-06-30 DIAGNOSIS — E1165 Type 2 diabetes mellitus with hyperglycemia: Secondary | ICD-10-CM | POA: Diagnosis not present

## 2015-06-30 DIAGNOSIS — K219 Gastro-esophageal reflux disease without esophagitis: Secondary | ICD-10-CM | POA: Diagnosis not present

## 2015-06-30 DIAGNOSIS — F418 Other specified anxiety disorders: Secondary | ICD-10-CM | POA: Diagnosis not present

## 2015-06-30 DIAGNOSIS — E668 Other obesity: Secondary | ICD-10-CM | POA: Diagnosis not present

## 2015-06-30 DIAGNOSIS — Z1389 Encounter for screening for other disorder: Secondary | ICD-10-CM | POA: Diagnosis not present

## 2015-06-30 DIAGNOSIS — I10 Essential (primary) hypertension: Secondary | ICD-10-CM | POA: Diagnosis not present

## 2015-10-11 DIAGNOSIS — H401122 Primary open-angle glaucoma, left eye, moderate stage: Secondary | ICD-10-CM | POA: Diagnosis not present

## 2015-10-11 DIAGNOSIS — H401111 Primary open-angle glaucoma, right eye, mild stage: Secondary | ICD-10-CM | POA: Diagnosis not present

## 2015-11-14 ENCOUNTER — Other Ambulatory Visit: Payer: Self-pay | Admitting: Internal Medicine

## 2015-11-14 DIAGNOSIS — M5416 Radiculopathy, lumbar region: Secondary | ICD-10-CM | POA: Diagnosis not present

## 2015-11-14 DIAGNOSIS — I10 Essential (primary) hypertension: Secondary | ICD-10-CM | POA: Diagnosis not present

## 2015-11-14 DIAGNOSIS — J301 Allergic rhinitis due to pollen: Secondary | ICD-10-CM | POA: Diagnosis not present

## 2015-11-14 DIAGNOSIS — B029 Zoster without complications: Secondary | ICD-10-CM | POA: Diagnosis not present

## 2015-11-14 DIAGNOSIS — K219 Gastro-esophageal reflux disease without esophagitis: Secondary | ICD-10-CM | POA: Diagnosis not present

## 2015-11-14 DIAGNOSIS — Z1231 Encounter for screening mammogram for malignant neoplasm of breast: Secondary | ICD-10-CM

## 2015-11-14 DIAGNOSIS — M1990 Unspecified osteoarthritis, unspecified site: Secondary | ICD-10-CM | POA: Diagnosis not present

## 2015-11-14 DIAGNOSIS — F418 Other specified anxiety disorders: Secondary | ICD-10-CM | POA: Diagnosis not present

## 2015-11-14 DIAGNOSIS — E1165 Type 2 diabetes mellitus with hyperglycemia: Secondary | ICD-10-CM | POA: Diagnosis not present

## 2015-11-14 DIAGNOSIS — Z6841 Body Mass Index (BMI) 40.0 and over, adult: Secondary | ICD-10-CM | POA: Diagnosis not present

## 2015-11-28 ENCOUNTER — Ambulatory Visit
Admission: RE | Admit: 2015-11-28 | Discharge: 2015-11-28 | Disposition: A | Discharge status: Home / Self Care | Payer: Medicare Other | Source: Ambulatory Visit | Attending: Internal Medicine | Admitting: Internal Medicine

## 2015-11-28 DIAGNOSIS — Z1231 Encounter for screening mammogram for malignant neoplasm of breast: Secondary | ICD-10-CM

## 2015-12-14 DIAGNOSIS — K5730 Diverticulosis of large intestine without perforation or abscess without bleeding: Secondary | ICD-10-CM | POA: Diagnosis not present

## 2015-12-14 DIAGNOSIS — Z86010 Personal history of colonic polyps: Secondary | ICD-10-CM | POA: Diagnosis not present

## 2015-12-14 DIAGNOSIS — D125 Benign neoplasm of sigmoid colon: Secondary | ICD-10-CM | POA: Diagnosis not present

## 2015-12-14 DIAGNOSIS — K649 Unspecified hemorrhoids: Secondary | ICD-10-CM | POA: Diagnosis not present

## 2015-12-15 DIAGNOSIS — D125 Benign neoplasm of sigmoid colon: Secondary | ICD-10-CM | POA: Diagnosis not present

## 2015-12-21 DIAGNOSIS — M4316 Spondylolisthesis, lumbar region: Secondary | ICD-10-CM | POA: Diagnosis not present

## 2015-12-21 DIAGNOSIS — M5416 Radiculopathy, lumbar region: Secondary | ICD-10-CM | POA: Diagnosis not present

## 2015-12-21 DIAGNOSIS — M4806 Spinal stenosis, lumbar region: Secondary | ICD-10-CM | POA: Diagnosis not present

## 2015-12-21 DIAGNOSIS — R030 Elevated blood-pressure reading, without diagnosis of hypertension: Secondary | ICD-10-CM | POA: Diagnosis not present

## 2015-12-21 DIAGNOSIS — Z6841 Body Mass Index (BMI) 40.0 and over, adult: Secondary | ICD-10-CM | POA: Diagnosis not present

## 2016-03-09 DIAGNOSIS — R8290 Unspecified abnormal findings in urine: Secondary | ICD-10-CM | POA: Diagnosis not present

## 2016-03-09 DIAGNOSIS — I10 Essential (primary) hypertension: Secondary | ICD-10-CM | POA: Diagnosis not present

## 2016-03-09 DIAGNOSIS — E1165 Type 2 diabetes mellitus with hyperglycemia: Secondary | ICD-10-CM | POA: Diagnosis not present

## 2016-03-09 DIAGNOSIS — N390 Urinary tract infection, site not specified: Secondary | ICD-10-CM | POA: Diagnosis not present

## 2016-03-15 DIAGNOSIS — E1165 Type 2 diabetes mellitus with hyperglycemia: Secondary | ICD-10-CM | POA: Diagnosis not present

## 2016-03-15 DIAGNOSIS — I10 Essential (primary) hypertension: Secondary | ICD-10-CM | POA: Diagnosis not present

## 2016-03-15 DIAGNOSIS — K219 Gastro-esophageal reflux disease without esophagitis: Secondary | ICD-10-CM | POA: Diagnosis not present

## 2016-03-15 DIAGNOSIS — F418 Other specified anxiety disorders: Secondary | ICD-10-CM | POA: Diagnosis not present

## 2016-03-15 DIAGNOSIS — M1990 Unspecified osteoarthritis, unspecified site: Secondary | ICD-10-CM | POA: Diagnosis not present

## 2016-03-15 DIAGNOSIS — Z23 Encounter for immunization: Secondary | ICD-10-CM | POA: Diagnosis not present

## 2016-03-15 DIAGNOSIS — J302 Other seasonal allergic rhinitis: Secondary | ICD-10-CM | POA: Diagnosis not present

## 2016-03-15 DIAGNOSIS — M5416 Radiculopathy, lumbar region: Secondary | ICD-10-CM | POA: Diagnosis not present

## 2016-03-15 DIAGNOSIS — Z6841 Body Mass Index (BMI) 40.0 and over, adult: Secondary | ICD-10-CM | POA: Diagnosis not present

## 2016-03-15 DIAGNOSIS — Z0000 Encounter for general adult medical examination without abnormal findings: Secondary | ICD-10-CM | POA: Diagnosis not present

## 2016-03-16 DIAGNOSIS — Z1212 Encounter for screening for malignant neoplasm of rectum: Secondary | ICD-10-CM | POA: Diagnosis not present

## 2016-04-09 DIAGNOSIS — H524 Presbyopia: Secondary | ICD-10-CM | POA: Diagnosis not present

## 2016-04-09 DIAGNOSIS — E113291 Type 2 diabetes mellitus with mild nonproliferative diabetic retinopathy without macular edema, right eye: Secondary | ICD-10-CM | POA: Diagnosis not present

## 2016-04-09 DIAGNOSIS — H401122 Primary open-angle glaucoma, left eye, moderate stage: Secondary | ICD-10-CM | POA: Diagnosis not present

## 2016-04-09 DIAGNOSIS — H401111 Primary open-angle glaucoma, right eye, mild stage: Secondary | ICD-10-CM | POA: Diagnosis not present

## 2016-04-28 ENCOUNTER — Emergency Department (HOSPITAL_COMMUNITY): Payer: Medicare Other | Admitting: Anesthesiology

## 2016-04-28 ENCOUNTER — Encounter (HOSPITAL_COMMUNITY): Payer: Self-pay | Admitting: Emergency Medicine

## 2016-04-28 ENCOUNTER — Encounter (HOSPITAL_COMMUNITY)
Admission: EM | Disposition: A | Discharge status: Home / Self Care | Payer: Self-pay | Source: Home / Self Care | Attending: Urology

## 2016-04-28 ENCOUNTER — Emergency Department (HOSPITAL_COMMUNITY): Payer: Medicare Other

## 2016-04-28 ENCOUNTER — Inpatient Hospital Stay (HOSPITAL_COMMUNITY)
Admission: EM | Admit: 2016-04-28 | Discharge: 2016-04-30 | DRG: 872 | Disposition: A | Discharge status: Home / Self Care | Payer: Medicare Other | Attending: Urology | Admitting: Urology

## 2016-04-28 DIAGNOSIS — A419 Sepsis, unspecified organism: Secondary | ICD-10-CM

## 2016-04-28 DIAGNOSIS — E119 Type 2 diabetes mellitus without complications: Secondary | ICD-10-CM | POA: Diagnosis present

## 2016-04-28 DIAGNOSIS — N200 Calculus of kidney: Secondary | ICD-10-CM

## 2016-04-28 DIAGNOSIS — B961 Klebsiella pneumoniae [K. pneumoniae] as the cause of diseases classified elsewhere: Secondary | ICD-10-CM | POA: Diagnosis not present

## 2016-04-28 DIAGNOSIS — N3001 Acute cystitis with hematuria: Secondary | ICD-10-CM

## 2016-04-28 DIAGNOSIS — J9811 Atelectasis: Secondary | ICD-10-CM | POA: Diagnosis not present

## 2016-04-28 DIAGNOSIS — N132 Hydronephrosis with renal and ureteral calculous obstruction: Secondary | ICD-10-CM | POA: Diagnosis not present

## 2016-04-28 DIAGNOSIS — Z466 Encounter for fitting and adjustment of urinary device: Secondary | ICD-10-CM | POA: Diagnosis not present

## 2016-04-28 DIAGNOSIS — Z794 Long term (current) use of insulin: Secondary | ICD-10-CM | POA: Diagnosis not present

## 2016-04-28 DIAGNOSIS — N136 Pyonephrosis: Secondary | ICD-10-CM | POA: Diagnosis present

## 2016-04-28 DIAGNOSIS — Z9049 Acquired absence of other specified parts of digestive tract: Secondary | ICD-10-CM | POA: Diagnosis not present

## 2016-04-28 DIAGNOSIS — I10 Essential (primary) hypertension: Secondary | ICD-10-CM | POA: Diagnosis not present

## 2016-04-28 DIAGNOSIS — N201 Calculus of ureter: Secondary | ICD-10-CM | POA: Diagnosis not present

## 2016-04-28 DIAGNOSIS — K219 Gastro-esophageal reflux disease without esophagitis: Secondary | ICD-10-CM | POA: Diagnosis not present

## 2016-04-28 HISTORY — PX: CYSTOSCOPY W/ URETERAL STENT PLACEMENT: SHX1429

## 2016-04-28 LAB — CBC WITH DIFFERENTIAL/PLATELET
Basophils Absolute: 0 10*3/uL (ref 0.0–0.1)
Basophils Relative: 0 %
Eosinophils Absolute: 0 10*3/uL (ref 0.0–0.7)
Eosinophils Relative: 0 %
HCT: 35.5 % — ABNORMAL LOW (ref 36.0–46.0)
Hemoglobin: 11.5 g/dL — ABNORMAL LOW (ref 12.0–15.0)
Lymphocytes Relative: 6 %
Lymphs Abs: 0.8 10*3/uL (ref 0.7–4.0)
MCH: 29.9 pg (ref 26.0–34.0)
MCHC: 32.4 g/dL (ref 30.0–36.0)
MCV: 92.2 fL (ref 78.0–100.0)
Monocytes Absolute: 1.3 10*3/uL — ABNORMAL HIGH (ref 0.1–1.0)
Monocytes Relative: 10 %
Neutro Abs: 10.6 10*3/uL — ABNORMAL HIGH (ref 1.7–7.7)
Neutrophils Relative %: 84 %
Platelets: 202 10*3/uL (ref 150–400)
RBC: 3.85 MIL/uL — ABNORMAL LOW (ref 3.87–5.11)
RDW: 13.4 % (ref 11.5–15.5)
WBC: 12.6 10*3/uL — ABNORMAL HIGH (ref 4.0–10.5)

## 2016-04-28 LAB — URINALYSIS, ROUTINE W REFLEX MICROSCOPIC
Bilirubin Urine: NEGATIVE
Glucose, UA: 100 mg/dL — AB
Ketones, ur: NEGATIVE mg/dL
Nitrite: NEGATIVE
Protein, ur: 100 mg/dL — AB
Specific Gravity, Urine: 1.021 (ref 1.005–1.030)
pH: 8 (ref 5.0–8.0)

## 2016-04-28 LAB — COMPREHENSIVE METABOLIC PANEL
ALT: 20 U/L (ref 14–54)
AST: 38 U/L (ref 15–41)
Albumin: 3.4 g/dL — ABNORMAL LOW (ref 3.5–5.0)
Alkaline Phosphatase: 62 U/L (ref 38–126)
Anion gap: 8 (ref 5–15)
BUN: 23 mg/dL — ABNORMAL HIGH (ref 6–20)
CO2: 27 mmol/L (ref 22–32)
Calcium: 9.5 mg/dL (ref 8.9–10.3)
Chloride: 97 mmol/L — ABNORMAL LOW (ref 101–111)
Creatinine, Ser: 1.32 mg/dL — ABNORMAL HIGH (ref 0.44–1.00)
GFR calc Af Amer: 48 mL/min — ABNORMAL LOW (ref >60)
GFR calc non Af Amer: 41 mL/min — ABNORMAL LOW (ref >60)
Glucose, Bld: 208 mg/dL — ABNORMAL HIGH (ref 65–99)
Potassium: 3.8 mmol/L (ref 3.5–5.1)
Sodium: 132 mmol/L — ABNORMAL LOW (ref 135–145)
Total Bilirubin: 1 mg/dL (ref 0.3–1.2)
Total Protein: 6.9 g/dL (ref 6.5–8.1)

## 2016-04-28 LAB — I-STAT CG4 LACTIC ACID, ED: Lactic Acid, Venous: 1.55 mmol/L (ref 0.5–1.9)

## 2016-04-28 LAB — URINE MICROSCOPIC-ADD ON

## 2016-04-28 LAB — LIPASE, BLOOD: Lipase: 15 U/L (ref 11–51)

## 2016-04-28 LAB — GLUCOSE, CAPILLARY
Glucose-Capillary: 143 mg/dL — ABNORMAL HIGH (ref 65–99)
Glucose-Capillary: 143 mg/dL — ABNORMAL HIGH (ref 65–99)

## 2016-04-28 LAB — MAGNESIUM: Magnesium: 1.4 mg/dL — ABNORMAL LOW (ref 1.7–2.4)

## 2016-04-28 SURGERY — CYSTOSCOPY, WITH RETROGRADE PYELOGRAM AND URETERAL STENT INSERTION
Anesthesia: General | Laterality: Left

## 2016-04-28 MED ORDER — SODIUM CHLORIDE 0.9 % IV SOLN
1500.0000 mg | INTRAVENOUS | Status: DC
  Administered 2016-04-28 – 2016-04-29 (×2): 1500 mg via INTRAVENOUS
  Filled 2016-04-28 (×3): qty 1500

## 2016-04-28 MED ORDER — SUCCINYLCHOLINE CHLORIDE 200 MG/10ML IV SOSY
PREFILLED_SYRINGE | INTRAVENOUS | Status: DC | PRN
  Administered 2016-04-28: 140 mg via INTRAVENOUS

## 2016-04-28 MED ORDER — SODIUM CHLORIDE 0.9 % IV SOLN
INTRAVENOUS | Status: DC
  Administered 2016-04-28 – 2016-04-29 (×2): via INTRAVENOUS

## 2016-04-28 MED ORDER — PIPERACILLIN-TAZOBACTAM 3.375 G IVPB: 3.3750 g | Freq: Three times a day (TID) | INTRAVENOUS | Status: DC

## 2016-04-28 MED ORDER — FENTANYL CITRATE (PF) 100 MCG/2ML IJ SOLN
INTRAMUSCULAR | Status: DC | PRN
  Administered 2016-04-28: 50 ug via INTRAVENOUS

## 2016-04-28 MED ORDER — SUCCINYLCHOLINE CHLORIDE 200 MG/10ML IV SOSY
PREFILLED_SYRINGE | INTRAVENOUS | Status: AC
  Filled 2016-04-28: qty 10

## 2016-04-28 MED ORDER — FENTANYL CITRATE (PF) 100 MCG/2ML IJ SOLN
INTRAMUSCULAR | Status: AC
Start: 2016-04-28 — End: 2016-04-28
  Filled 2016-04-28: qty 2

## 2016-04-28 MED ORDER — VANCOMYCIN HCL IN DEXTROSE 1-5 GM/200ML-% IV SOLN
1000.0000 mg | INTRAVENOUS | Status: AC
  Administered 2016-04-28: 1000 mg via INTRAVENOUS
  Filled 2016-04-28: qty 200

## 2016-04-28 MED ORDER — HYDROMORPHONE HCL 1 MG/ML IJ SOLN: 0.2500 mg | INTRAMUSCULAR | Status: DC | PRN

## 2016-04-28 MED ORDER — ONDANSETRON HCL 4 MG/2ML IJ SOLN
INTRAMUSCULAR | Status: DC | PRN
  Administered 2016-04-28: 4 mg via INTRAVENOUS

## 2016-04-28 MED ORDER — PIPERACILLIN-TAZOBACTAM 3.375 G IVPB 30 MIN: 3.3750 g | Freq: Once | INTRAVENOUS | Status: DC

## 2016-04-28 MED ORDER — ACETAMINOPHEN 325 MG PO TABS
975.0000 mg | ORAL_TABLET | Freq: Once | ORAL | Status: AC
  Administered 2016-04-28: 975 mg via ORAL
  Filled 2016-04-28: qty 3

## 2016-04-28 MED ORDER — SODIUM CHLORIDE 0.9 % IV BOLUS (SEPSIS): 1000.0000 mL | Freq: Once | INTRAVENOUS | Status: DC

## 2016-04-28 MED ORDER — LACTATED RINGERS IV SOLN
INTRAVENOUS | Status: DC | PRN
  Administered 2016-04-28: 21:00:00 via INTRAVENOUS

## 2016-04-28 MED ORDER — MIDAZOLAM HCL 2 MG/2ML IJ SOLN
INTRAMUSCULAR | Status: AC
  Filled 2016-04-28: qty 2

## 2016-04-28 MED ORDER — ONDANSETRON HCL 4 MG/2ML IJ SOLN
INTRAMUSCULAR | Status: AC
  Filled 2016-04-28: qty 2

## 2016-04-28 MED ORDER — PROPOFOL 10 MG/ML IV BOLUS
INTRAVENOUS | Status: DC | PRN
  Administered 2016-04-28: 200 mg via INTRAVENOUS

## 2016-04-28 MED ORDER — INSULIN ASPART 100 UNIT/ML ~~LOC~~ SOLN
0.0000 [IU] | Freq: Three times a day (TID) | SUBCUTANEOUS | Status: DC
  Administered 2016-04-29 (×3): 2 [IU] via SUBCUTANEOUS

## 2016-04-28 MED ORDER — SODIUM CHLORIDE 0.9 % IV BOLUS (SEPSIS)
1000.0000 mL | Freq: Once | INTRAVENOUS | Status: AC
  Administered 2016-04-28: 1000 mL via INTRAVENOUS

## 2016-04-28 MED ORDER — ONDANSETRON HCL 4 MG/2ML IJ SOLN: 4.0000 mg | INTRAMUSCULAR | Status: DC | PRN

## 2016-04-28 MED ORDER — PIPERACILLIN-TAZOBACTAM 3.375 G IVPB
3.3750 g | Freq: Three times a day (TID) | INTRAVENOUS | Status: DC
  Administered 2016-04-29 – 2016-04-30 (×4): 3.375 g via INTRAVENOUS
  Filled 2016-04-28 (×4): qty 50

## 2016-04-28 MED ORDER — IOHEXOL 300 MG/ML  SOLN
INTRAMUSCULAR | Status: DC | PRN
  Administered 2016-04-28: 8 mL

## 2016-04-28 MED ORDER — PIPERACILLIN-TAZOBACTAM 3.375 G IVPB 30 MIN
3.3750 g | INTRAVENOUS | Status: AC
  Administered 2016-04-28: 3.375 g via INTRAVENOUS
  Filled 2016-04-28: qty 50

## 2016-04-28 MED ORDER — PROPOFOL 10 MG/ML IV BOLUS
INTRAVENOUS | Status: AC
  Filled 2016-04-28: qty 20

## 2016-04-28 MED ORDER — ONDANSETRON HCL 4 MG/2ML IJ SOLN: 4.0000 mg | Freq: Once | INTRAMUSCULAR | Status: DC | PRN

## 2016-04-28 MED ORDER — MAGNESIUM SULFATE 2 GM/50ML IV SOLN
2.0000 g | Freq: Once | INTRAVENOUS | Status: AC
  Administered 2016-04-28: 2 g via INTRAVENOUS
  Filled 2016-04-28: qty 50

## 2016-04-28 MED ORDER — IOPAMIDOL (ISOVUE-300) INJECTION 61%
INTRAVENOUS | Status: AC
  Administered 2016-04-28: 80 mL via INTRAVENOUS
  Filled 2016-04-28: qty 100

## 2016-04-28 MED ORDER — MEPERIDINE HCL 50 MG/ML IJ SOLN: 6.2500 mg | INTRAMUSCULAR | Status: DC | PRN

## 2016-04-28 MED ORDER — SODIUM CHLORIDE 0.9 % IJ SOLN
INTRAMUSCULAR | Status: AC
Start: 2016-04-28 — End: 2016-04-29
  Filled 2016-04-28: qty 50

## 2016-04-28 MED ORDER — VANCOMYCIN HCL IN DEXTROSE 1-5 GM/200ML-% IV SOLN: 1000.0000 mg | Freq: Once | INTRAVENOUS | Status: DC

## 2016-04-28 MED ORDER — LIDOCAINE 2% (20 MG/ML) 5 ML SYRINGE
INTRAMUSCULAR | Status: DC | PRN
  Administered 2016-04-28: 100 mg via INTRAVENOUS

## 2016-04-28 MED ORDER — SODIUM CHLORIDE 0.9 % IV SOLN
2500.0000 mg | INTRAVENOUS | Status: DC
  Filled 2016-04-28: qty 2500

## 2016-04-28 MED ORDER — SODIUM CHLORIDE 0.9 % IR SOLN
Status: DC | PRN
  Administered 2016-04-28: 3000 mL via INTRAVESICAL

## 2016-04-28 MED ORDER — LIDOCAINE 2% (20 MG/ML) 5 ML SYRINGE
INTRAMUSCULAR | Status: AC
  Filled 2016-04-28: qty 5

## 2016-04-28 MED ORDER — MIDAZOLAM HCL 5 MG/5ML IJ SOLN
INTRAMUSCULAR | Status: DC | PRN
Start: 2016-04-28 — End: 2016-04-28
  Administered 2016-04-28: 2 mg via INTRAVENOUS

## 2016-04-28 SURGICAL SUPPLY — 14 items
BAG URINE DRAINAGE (UROLOGICAL SUPPLIES) ×2 IMPLANT
BAG URO CATCHER STRL LF (MISCELLANEOUS) ×2 IMPLANT
CATH FOLEY 2WAY SLVR 30CC 16FR (CATHETERS) ×2 IMPLANT
CATH INTERMIT  6FR 70CM (CATHETERS) ×2 IMPLANT
CLOTH BEACON ORANGE TIMEOUT ST (SAFETY) ×2 IMPLANT
GLOVE BIOGEL M 8.0 STRL (GLOVE) ×2 IMPLANT
GOWN STRL REUS W/ TWL XL LVL3 (GOWN DISPOSABLE) ×1 IMPLANT
GOWN STRL REUS W/TWL XL LVL3 (GOWN DISPOSABLE) ×1
GUIDEWIRE STR DUAL SENSOR (WIRE) ×2 IMPLANT
MANIFOLD NEPTUNE II (INSTRUMENTS) ×2 IMPLANT
PACK CYSTO (CUSTOM PROCEDURE TRAY) ×2 IMPLANT
STENT URET 6FRX26 CONTOUR (STENTS) ×2 IMPLANT
SYR 10ML LL (SYRINGE) ×2 IMPLANT
TUBING CONNECTING 10 (TUBING) ×2 IMPLANT

## 2016-04-28 NOTE — Discharge Instructions (Signed)

## 2016-04-28 NOTE — Progress Notes (Signed)
Pharmacy Antibiotic Note  Angela Harvey is a 65 y.o. female admitted on 04/28/2016 with weakness, abdominal pain, emesis, and suspected sepsis.  Code sepsis notification was initially sent at 1400, but per discussion with ED provider at 1430, no antibiotic orders were to be started.  Pharmacy has now been consulted for Vancomycin and Zosyn dosing.  SCr 1.32, CrCl ~ 47 ml/min Lactic acid 1.55 Tm 102.7 WBC 12.6  Plan:  Zosyn 3.375g IV Q8H infused over 4hrs.  Vancomycin 2.5g (1g + 1.5g) IV stat, then 1500mg  IV q24h.  Measure Vanc trough at steady state.  Follow up renal fxn, culture results, and clinical course.  Height: 5\' 3"  (160 cm) Weight: 268 lb (121.6 kg) IBW/kg (Calculated) : 52.4  Temp (24hrs), Avg:102.7 F (39.3 C), Min:102.7 F (39.3 C), Max:102.7 F (39.3 C)   Recent Labs Lab 04/28/16 1446 04/28/16 1506  WBC 12.6*  --   CREATININE 1.32*  --   LATICACIDVEN  --  1.55    Estimated Creatinine Clearance: 53 mL/min (by C-G formula based on SCr of 1.32 mg/dL (H)).    No Known Allergies  Antimicrobials this admission: 11/18 Vancomycin >>  11/18 Zosyn >>   Dose adjustments this admission:   Microbiology results: 11/18 BCx: sent 11/18 UCx: sent   Thank you for allowing pharmacy to be a part of this patient's care.  Gretta Arab PharmD, BCPS Pager [phone] 04/28/2016 4:11 PM

## 2016-04-28 NOTE — Anesthesia Procedure Notes (Signed)
Procedure Name: Intubation Date/Time: 04/28/2016 8:48 PM Performed by: Lind Covert Pre-anesthesia Checklist: Patient identified, Emergency Drugs available, Suction available, Patient being monitored and Timeout performed Patient Re-evaluated:Patient Re-evaluated prior to inductionOxygen Delivery Method: Circle system utilized Preoxygenation: Pre-oxygenation with 100% oxygen Intubation Type: IV induction, Rapid sequence and Cricoid Pressure applied Laryngoscope Size: Mac and 3 Grade View: Grade II Tube type: Oral Tube size: 7.0 mm Number of attempts: 1 Airway Equipment and Method: Stylet Placement Confirmation: ETT inserted through vocal cords under direct vision Secured at: 22 cm Tube secured with: Tape Dental Injury: Teeth and Oropharynx as per pre-operative assessment

## 2016-04-28 NOTE — H&P (Signed)
Angela Harvey is an 65 y.o. female.  Assessment: 1. 4 mm left distal ureteral stone. Her stone is located at the level of the intramural ureter with associated left hydronephrosis and perinephric stranding. No renal calculi are identified.  2. Possible UTI with turbid appearing urine and findings consistent with UTI despite the lack of symptoms suggestive of a UTI. The patient is a diabetic but does not have a history of recurrent UTIs in the past.  3. Possible urosepsis. Although her lactic acid was found to be normal and she is not hypotensive or tachycardic she appears to have infected urine and in a diabetic with a fever to 102.7, and elevated white blood cell count with a left shift I have recommended source control with stent placement.  I have discussed with the patient the options although I told her in my opinion I felt urgent stent placement was medically indicated and in her best interest. I therefore went over the procedure with her in detail including the very low probability of stone removal depending on my intraoperative findings and the fact that I would most likely proceed tonight with stent placement only and address her distal ureteral stone once all infection has cleared. I therefore discussed the procedure of stent placement, its risks and complications, the probability of success, the need for at least an overnight stay in the hospital and the anticipated postoperative course. She understands and has elected to proceed.  Plan: 1. Her urine has been cultured. 2. She has already received a single dose of Zosyn. She will be maintained on intravenous antibiotics postoperatively. 3. She will be taken to the operating room for urgent left ureteral stent placement.     HPI: Angela Harvey is a 65 year old female diabetic who presented to the emergency room with malaise and feeling poorly. She was not having significant pain but reports that over the past few day she has noted some slight  increased in the frequency of urination. She was found to have a fever of 102.7. Blood work revealed a slight elevation of her white count to 12.6 with a left shift and her urinalysis was noted to be turbid with too numerous to count white blood cells, a pH of 8, positive leukocyte esterase and many bacteria. A CT scan was obtained which reveals a 4 mm stone located in the area of the intramural ureter on the left-hand side with associated left hydronephrosis and perinephric stranding noted. She reports she was not having any significant flank pain. She has no prior history of recurrent UTIs and has never had a kidney stone in the past. Her condition would be moderate to severe with no modifying factors or other associated signs and symptoms.  Past Medical History:  Diagnosis Date  . Diabetes mellitus without complication ([place])   . Hiatal hernia   . Hypertension     Past Surgical History:  Procedure Laterality Date  . CHOLECYSTECTOMY N/A 08/16/2014   Procedure: LAPAROSCOPIC CHOLECYSTECTOMY WITH INTRAOPERATIVE CHOLANGIOGRAM;  Surgeon: Kaylyn Lim, MD;  Location: [facility];  Service: General;  Laterality: N/A;  . HERNIA REPAIR     Current medications: Celebrex 200 mg daily  Neurontin 100 mg 3 times a day  Cozaar 50 mg daily  NovoLog Flex E [place] 100 units/mL 8 units 3 times a day  Protonix 40 mg daily  Zofran 4 mg every 6 hours when necessary nausea  Tylenol 500 mg every 6 hours when necessary  Reglan 10 mg 3 times a day  Multivitamin   Allergies: No Known Allergies  No family history of GU malignancy or renal disease.   Social History:  reports that she has never smoked. She has never used smokeless tobacco. She reports that she does not drink alcohol. Her drug history is not on file.  Review of Systems: Pertinent items are noted in HPI. A comprehensive review of systems was negative except as noted above.  Results for orders placed or performed during the hospital encounter of 04/28/16  (from the past 48 hour(s))  Lipase, blood     Status: None   Collection Time: 04/28/16  2:46 PM  Result Value Ref Range   Lipase 15 11 - 51 U/L  Comprehensive metabolic panel     Status: Abnormal   Collection Time: 04/28/16  2:46 PM  Result Value Ref Range   Sodium 132 (L) 135 - 145 mmol/L   Potassium 3.8 3.5 - 5.1 mmol/L   Chloride 97 (L) 101 - 111 mmol/L   CO2 27 22 - 32 mmol/L   Glucose, Bld 208 (H) 65 - 99 mg/dL   BUN 23 (H) 6 - 20 mg/dL   Creatinine, Ser 1.32 (H) 0.44 - 1.00 mg/dL   Calcium 9.5 8.9 - 10.3 mg/dL   Total Protein 6.9 6.5 - 8.1 g/dL   Albumin 3.4 (L) 3.5 - 5.0 g/dL   AST 38 15 - 41 U/L   ALT 20 14 - 54 U/L   Alkaline Phosphatase 62 38 - 126 U/L   Total Bilirubin 1.0 0.3 - 1.2 mg/dL   GFR calc non Af Amer 41 (L) >60 mL/min   GFR calc Af Amer 48 (L) >60 mL/min    Comment: (NOTE) The eGFR has been calculated using the CKD EPI equation. This calculation has not been validated in all clinical situations. eGFR's persistently <60 mL/min signify possible Chronic Kidney Disease.    Anion gap 8 5 - 15  CBC WITH DIFFERENTIAL     Status: Abnormal   Collection Time: 04/28/16  2:46 PM  Result Value Ref Range   WBC 12.6 (H) 4.0 - 10.5 K/uL   RBC 3.85 (L) 3.87 - 5.11 MIL/uL   Hemoglobin 11.5 (L) 12.0 - 15.0 g/dL   HCT 35.5 (L) 36.0 - 46.0 %   MCV 92.2 78.0 - 100.0 fL   MCH 29.9 26.0 - 34.0 pg   MCHC 32.4 30.0 - 36.0 g/dL   RDW 13.4 11.5 - 15.5 %   Platelets 202 150 - 400 K/uL   Neutrophils Relative % 84 %   Neutro Abs 10.6 (H) 1.7 - 7.7 K/uL   Lymphocytes Relative 6 %   Lymphs Abs 0.8 0.7 - 4.0 K/uL   Monocytes Relative 10 %   Monocytes Absolute 1.3 (H) 0.1 - 1.0 K/uL   Eosinophils Relative 0 %   Eosinophils Absolute 0.0 0.0 - 0.7 K/uL   Basophils Relative 0 %   Basophils Absolute 0.0 0.0 - 0.1 K/uL  Magnesium     Status: Abnormal   Collection Time: 04/28/16  2:46 PM  Result Value Ref Range   Magnesium 1.4 (L) 1.7 - 2.4 mg/dL  I-Stat CG4 Lactic Acid, ED   (not at  [facility])     Status: None   Collection Time: 04/28/16  3:06 PM  Result Value Ref Range   Lactic Acid, Venous 1.55 0.5 - 1.9 mmol/L  Urinalysis, Routine w reflex microscopic     Status: Abnormal   Collection Time: 04/28/16  3:10 PM  Result Value  Ref Range   Color, Urine YELLOW YELLOW   APPearance TURBID (A) CLEAR   Specific Gravity, Urine 1.021 1.005 - 1.030   pH 8.0 5.0 - 8.0   Glucose, UA 100 (A) NEGATIVE mg/dL   Hgb urine dipstick MODERATE (A) NEGATIVE   Bilirubin Urine NEGATIVE NEGATIVE   Ketones, ur NEGATIVE NEGATIVE mg/dL   Protein, ur 100 (A) NEGATIVE mg/dL   Nitrite NEGATIVE NEGATIVE   Leukocytes, UA LARGE (A) NEGATIVE  Urine microscopic-add on     Status: Abnormal   Collection Time: 04/28/16  3:10 PM  Result Value Ref Range   Squamous Epithelial / LPF 6-30 (A) NONE SEEN   WBC, UA TOO NUMEROUS TO COUNT 0 - 5 WBC/hpf   RBC / HPF 6-30 0 - 5 RBC/hpf   Bacteria, UA MANY (A) NONE SEEN    Dg Chest 2 View  Result Date: 04/28/2016 CLINICAL DATA:  Generalized weakness EXAM: CHEST  2 VIEW COMPARISON:  Report of prior chest CT June 24, 2013 available; images from that study cannot be retrieved. FINDINGS: There is no edema or consolidation. There is slight atelectasis in the lingula. Heart size and pulmonary vascularity are normal. No adenopathy. There is mild degenerative change in the thoracic spine. IMPRESSION: Mild lingular atelectasis.  No edema or consolidation. Electronically Signed   By: Lowella Grip III M.D.   On: 04/28/2016 15:36   Ct Abdomen Pelvis W Contrast  Result Date: 04/28/2016 CLINICAL DATA:  Nausea, vomiting. EXAM: CT ABDOMEN AND PELVIS WITH CONTRAST TECHNIQUE: Multidetector CT imaging of the abdomen and pelvis was performed using the standard protocol following bolus administration of intravenous contrast. CONTRAST:  62m ISOVUE-300 IOPAMIDOL (ISOVUE-300) INJECTION 61% COMPARISON:  08/10/2014 FINDINGS: Lower chest: 6 mm nodule in the left lower lobe,  stable since prior CTs. Lung bases otherwise clear. Heart is normal size. Small hiatal hernia. Hepatobiliary: Prior cholecystectomy.  No focal hepatic abnormality. Pancreas: No focal abnormality or ductal dilatation. Spleen: No focal abnormality.  Normal size. Adrenals/Urinary Tract: There is mild to moderate left hydronephrosis and perinephric stranding due to a 4 mm left UVJ stone. No renal or ureteral stones or hydronephrosis on the right. Adrenal glands and urinary bladder unremarkable. Stomach/Bowel: Appendix is normal. Stomach, large and small bowel grossly unremarkable. Postoperative changes in the region of the stomach. Vascular/Lymphatic: No evidence of aneurysm or adenopathy. Reproductive: Prior hysterectomy.  No adnexal masses. Other: No free fluid or free air. Musculoskeletal: No acute bony abnormality or focal bone lesion. Degenerative disc and facet disease in the lower lumbar spine. IMPRESSION: 4 mm left UVJ stone with left hydronephrosis and perinephric stranding. Electronically Signed   By: KRolm BaptiseM.D.   On: 04/28/2016 16:17  Her CT scan images were independently reviewed as noted above.  Temp:  [100 F (37.8 C)-102.7 F (39.3 C)] 100 F (37.8 C) (11/18 1628) Pulse Rate:  [84-100] 84 (11/18 1900) Resp:  [15-21] 21 (11/18 1900) BP: (123-190)/(48-92) 153/70 (11/18 1900) SpO2:  [95 %-98 %] 98 % (11/18 1900) Weight:  [268 lb (121.6 kg)] 268 lb (121.6 kg) (11/18 1424)  Physical Exam: General appearance: alert and appears stated age. Moderate obesity  Head: Normocephalic, without obvious abnormality, atraumatic Eyes: conjunctivae/corneas clear. EOM's intact.  Oropharynx: moist mucous membranes Neck: supple, symmetrical, trachea midline Resp: normal respiratory effort Cardio: regular rate and rhythm Back: symmetric, no curvature. ROM normal. Mild left CVAT  GI: soft, non-tender; bowel sounds normal; no masses,  no organomegaly Pelvic: deferred Extremities: extremities normal,  atraumatic, no  cyanosis or edema Skin: Skin color normal. No visible rashes or lesions Neurologic: Grossly normal     Massiah Minjares C 04/28/2016, 7:51 PM

## 2016-04-28 NOTE — Op Note (Signed)
PATIENT:  Angela Harvey  PRE-OPERATIVE DIAGNOSIS: 1. Obstructing left distal ureteral stone . 2. Possibly infected urine with fever.  POST-OPERATIVE DIAGNOSIS: Same  PROCEDURE: 1. Cystoscopy with left retrograde pyelogram including interpretation. 2. Fluoroscopy use less than 1 hour. 3. Left double-J stent placement  SURGEON:  Claybon Jabs  INDICATION: Angela Harvey is a 65 year old diabetic female who presented to the emergency room with generalized symptoms of malaise and was found to have fever to 102.7 with a slight elevation of her white count and infected appearing urine. A CT scan revealed a 4 mm stone obstructing her left ureter located at the ureterovesical junction on the left-hand side with associated perinephric stranding. She therefore brought to the operating room for urgent stent placement to decompress the system.  ANESTHESIA:  General  EBL:  Minimal  DRAINS: 6 French, 24 cm double-J stent in the left ureter (no string)  LOCAL MEDICATIONS USED:  None  SPECIMEN:  None  Description of procedure: After informed consent the patient was taken to the operating room and placed on the table in a supine position. General anesthesia was then administered. Once fully anesthetized the patient was moved to the dorsal lithotomy position and the genitalia were sterilely prepped and draped in standard fashion. An official timeout was then performed.  The 76 French cystoscope with 30 lens was passed by direct vision into the bladder. The bladder was visually inspected and there were some debris noted in the bladder but there were no tumors, stones or inflammatory lesions. Both ureteral orifices were noted to be of normal configuration and position. There was some edema associated with the left ureteral orifice consistent with her known left distal ureteral stone.  A 6 French open-ended ureteral cath was then passed through the cystoscope and into the left ureteral orifice.  Full-strength Omnipaque contrast was then injected through the open-ended catheter and up the left ureter under direct fluoroscopic control. This revealed a filling defect in the distal ureter consistent with her stone with mild dilatation of the ureter proximal. Minimal amount of pressure was used to perform the study.  A 0.038 inch floppy-tipped guidewire was then passed through the open-ended catheter and up the left ureter under fluoroscopy into the area the renal pelvis. The open-ended catheter was removed and the double-J stent was loaded over the guidewire and passed over the guidewire into the area the renal pelvis. As the guidewire was removed good curl was noted in the renal pelvis as well as in the bladder. The bladder was then drained, the cystoscope was removed and a 16 French Foley catheter was inserted to further decompress the system. The patient tolerated the procedure well and there were no intraoperative complications.   PLAN OF CARE: Discharge to home after PACU  PATIENT DISPOSITION:  PACU - hemodynamically stable.

## 2016-04-28 NOTE — Anesthesia Preprocedure Evaluation (Signed)
Anesthesia Evaluation  Patient identified by MRN, date of birth, ID band Patient awake    Reviewed: Allergy & Precautions, NPO status , Patient's Chart, lab work & pertinent test results  Airway Mallampati: I  TM Distance: >3 FB Neck ROM: Full    Dental   Pulmonary    Pulmonary exam normal        Cardiovascular hypertension, Pt. on medications Normal cardiovascular exam     Neuro/Psych    GI/Hepatic GERD  ,  Endo/Other  diabetes, Type 2, Insulin Dependent  Renal/GU      Musculoskeletal   Abdominal   Peds  Hematology   Anesthesia Other Findings   Reproductive/Obstetrics                             Anesthesia Physical Anesthesia Plan  ASA: III  Anesthesia Plan: General   Post-op Pain Management:    Induction: Intravenous  Airway Management Planned: Oral ETT  Additional Equipment:   Intra-op Plan:   Post-operative Plan: Extubation in OR  Informed Consent: I have reviewed the patients History and Physical, chart, labs and discussed the procedure including the risks, benefits and alternatives for the proposed anesthesia with the patient or authorized representative who has indicated his/her understanding and acceptance.     Plan Discussed with: CRNA and Surgeon  Anesthesia Plan Comments:         Anesthesia Quick Evaluation

## 2016-04-28 NOTE — ED Provider Notes (Signed)
[facility] DEPT Provider Note   CSN: [id number] Arrival date & time: 04/28/16  1315     History   Chief Complaint Chief Complaint  Patient presents with  . Weakness  . Abdominal Pain    HPI  Blood pressure (!) 137/48, pulse 100, temperature 102.7 F (39.3 C), temperature source Oral, resp. rate 20, height 5\' 3"  (1.6 m), SpO2 97 %.  Angela Harvey is a 65 y.o. female with past medical history significant for insulin-dependent diabetes, hypertension complaining of generalized weakness worsening over the course of 2 weeks associated with nonbloody, nonbilious, non-coffee-ground emesis starting several days ago. She's been so weak she can't leave her bed to vomit, she vomited total next to the bed. She's had reduced by mouth intake and urinary frequency with reduced urinary output without dysuria, she's had mild epigastric discomfort with no real pain. She is status post cholecystectomy for acute pancreatitis. States that she still has her appendix, she also had several hernias repairs, no history of small bowel obstructions. She endorses productive cough with no chest pain, shortness of breath in general but she does note dyspnea on exertion. She has had her flu shot this year, she denies rhinorrhea, sore throat, otalgia, headache, cervicalgia, rash, sick contacts, recent travel.   Past Medical History:  Diagnosis Date  . Diabetes mellitus without complication ([place])   . Hiatal hernia   . Hypertension     Patient Active Problem List   Diagnosis Date Noted  . Severe protein-calorie malnutrition ([place]) 08/15/2014  . Malnutrition of moderate degree ([place]) 08/15/2014  . Essential hypertension 08/14/2014  . Diabetes mellitus, type II ([place]) 08/14/2014  . GERD (gastroesophageal reflux disease) 08/14/2014  . Acute biliary pancreatitis 08/08/2014    Past Surgical History:  Procedure Laterality Date  . CHOLECYSTECTOMY N/A 08/16/2014   Procedure: LAPAROSCOPIC CHOLECYSTECTOMY WITH  INTRAOPERATIVE CHOLANGIOGRAM;  Surgeon: Kaylyn Lim, MD;  Location: [facility];  Service: General;  Laterality: N/A;  . HERNIA REPAIR      OB History    No data available       Home Medications    Prior to Admission medications   Medication Sig Start Date End Date Taking? Authorizing Provider  acetaminophen (TYLENOL) 500 MG tablet Take 1,000 mg by mouth every 6 (six) hours as needed.   Yes Historical Provider, MD  celecoxib (CELEBREX) 200 MG capsule Take 200 mg by mouth daily. 02/16/16  Yes Historical Provider, MD  gabapentin (NEURONTIN) 100 MG capsule Take 100 mg by mouth 3 (three) times daily. 02/10/16  Yes Historical Provider, MD  losartan (COZAAR) 50 MG tablet Take 50 mg by mouth daily. 10/17/15  Yes Historical Provider, MD  metoCLOPramide (REGLAN) 10 MG tablet Take 10 mg by mouth 3 (three) times daily. 07/08/14  Yes Historical Provider, MD  Multiple Vitamin (MULTIVITAMIN WITH MINERALS) TABS tablet Take 1 tablet by mouth daily.   Yes Historical Provider, MD  NOVOLOG FLEXPEN 100 UNIT/ML FlexPen Inject 8 Units into the skin 3 (three) times daily before meals. 04/15/16  Yes Historical Provider, MD  omeprazole (PRILOSEC) 20 MG capsule Take 20 mg by mouth daily.   Yes Historical Provider, MD  TRESIBA FLEXTOUCH 200 UNIT/ML SOPN Inject 20 Units into the skin 2 (two) times daily. 02/18/16  Yes Historical Provider, MD  ondansetron (ZOFRAN) 4 MG tablet Take 1 tablet (4 mg total) by mouth every 6 (six) hours as needed for nausea. Patient not taking: Reported on 04/28/2016 08/17/14   Burnard Bunting, MD  pantoprazole (PROTONIX) 40 MG  tablet Take 1 tablet (40 mg total) by mouth daily. Patient not taking: Reported on 04/28/2016 08/17/14   Burnard Bunting, MD    Family History No family history on file.  Social History Social History  Substance Use Topics  . Smoking status: Never Smoker  . Smokeless tobacco: Never Used  . Alcohol use No     Allergies   Patient has no known allergies.   Review of  Systems Review of Systems  10 systems reviewed and found to be negative, except as noted in the HPI.  Physical Exam Updated Vital Signs BP 160/67   Pulse 87   Temp 100 F (37.8 C)   Resp 18   Ht 5\' 3"  (1.6 m)   Wt 121.6 kg   SpO2 97%   BMI 47.47 kg/m   Physical Exam  Constitutional: She is oriented to person, place, and time. She appears well-developed and well-nourished. No distress.  HENT:  Head: Normocephalic and atraumatic.  Mouth/Throat: Oropharynx is clear and moist.  Eyes: Conjunctivae and EOM are normal. Pupils are equal, round, and reactive to light.  Neck: Normal range of motion.  Cardiovascular: Normal rate, regular rhythm and intact distal pulses.   Pulmonary/Chest: Effort normal and breath sounds normal. No stridor. No respiratory distress. She has no wheezes. She has no rales. She exhibits no tenderness.  Abdominal: Soft. Bowel sounds are normal. She exhibits no distension and no mass. There is no tenderness. There is no rebound and no guarding.  Mild, diffuse tenderness to palpation with no guarding or rebound.  Murphy sign negative, no tenderness to palpation over McBurney's point, Rovsings, Psoas and obturator all negative.   Genitourinary:  Genitourinary Comments: No CVA tenderness to percussion bilaterally  Musculoskeletal: Normal range of motion.  Neurological: She is alert and oriented to person, place, and time.  Skin: She is not diaphoretic.  Psychiatric: She has a normal mood and affect.  Nursing note and vitals reviewed.    ED Treatments / Results  Labs (all labs ordered are listed, but only abnormal results are displayed) Labs Reviewed  COMPREHENSIVE METABOLIC PANEL - Abnormal; Notable for the following:       Result Value   Sodium 132 (*)    Chloride 97 (*)    Glucose, Bld 208 (*)    BUN 23 (*)    Creatinine, Ser 1.32 (*)    Albumin 3.4 (*)    GFR calc non Af Amer 41 (*)    GFR calc Af Amer 48 (*)    All other components within normal  limits  URINALYSIS, ROUTINE W REFLEX MICROSCOPIC (NOT AT [facility]) - Abnormal; Notable for the following:    APPearance TURBID (*)    Glucose, UA 100 (*)    Hgb urine dipstick MODERATE (*)    Protein, ur 100 (*)    Leukocytes, UA LARGE (*)    All other components within normal limits  CBC WITH DIFFERENTIAL/PLATELET - Abnormal; Notable for the following:    WBC 12.6 (*)    RBC 3.85 (*)    Hemoglobin 11.5 (*)    HCT 35.5 (*)    Neutro Abs 10.6 (*)    Monocytes Absolute 1.3 (*)    All other components within normal limits  MAGNESIUM - Abnormal; Notable for the following:    Magnesium 1.4 (*)    All other components within normal limits  URINE MICROSCOPIC-ADD ON - Abnormal; Notable for the following:    Squamous Epithelial / LPF 6-30 (*)  Bacteria, UA MANY (*)    All other components within normal limits  CULTURE, BLOOD (ROUTINE X 2)  CULTURE, BLOOD (ROUTINE X 2)  URINE CULTURE  LIPASE, BLOOD  I-STAT CG4 LACTIC ACID, ED  I-STAT CG4 LACTIC ACID, ED    EKG  EKG Interpretation None       Radiology Dg Chest 2 View  Result Date: 04/28/2016 CLINICAL DATA:  Generalized weakness EXAM: CHEST  2 VIEW COMPARISON:  Report of prior chest CT June 24, 2013 available; images from that study cannot be retrieved. FINDINGS: There is no edema or consolidation. There is slight atelectasis in the lingula. Heart size and pulmonary vascularity are normal. No adenopathy. There is mild degenerative change in the thoracic spine. IMPRESSION: Mild lingular atelectasis.  No edema or consolidation. Electronically Signed   By: Lowella Grip III M.D.   On: 04/28/2016 15:36   Ct Abdomen Pelvis W Contrast  Result Date: 04/28/2016 CLINICAL DATA:  Nausea, vomiting. EXAM: CT ABDOMEN AND PELVIS WITH CONTRAST TECHNIQUE: Multidetector CT imaging of the abdomen and pelvis was performed using the standard protocol following bolus administration of intravenous contrast. CONTRAST:  44mL ISOVUE-300 IOPAMIDOL  (ISOVUE-300) INJECTION 61% COMPARISON:  08/10/2014 FINDINGS: Lower chest: 6 mm nodule in the left lower lobe, stable since prior CTs. Lung bases otherwise clear. Heart is normal size. Small hiatal hernia. Hepatobiliary: Prior cholecystectomy.  No focal hepatic abnormality. Pancreas: No focal abnormality or ductal dilatation. Spleen: No focal abnormality.  Normal size. Adrenals/Urinary Tract: There is mild to moderate left hydronephrosis and perinephric stranding due to a 4 mm left UVJ stone. No renal or ureteral stones or hydronephrosis on the right. Adrenal glands and urinary bladder unremarkable. Stomach/Bowel: Appendix is normal. Stomach, large and small bowel grossly unremarkable. Postoperative changes in the region of the stomach. Vascular/Lymphatic: No evidence of aneurysm or adenopathy. Reproductive: Prior hysterectomy.  No adnexal masses. Other: No free fluid or free air. Musculoskeletal: No acute bony abnormality or focal bone lesion. Degenerative disc and facet disease in the lower lumbar spine. IMPRESSION: 4 mm left UVJ stone with left hydronephrosis and perinephric stranding. Electronically Signed   By: Rolm Baptise M.D.   On: 04/28/2016 16:17    Procedures Procedures (including critical care time)  Medications Ordered in ED Medications  sodium chloride 0.9 % bolus 1,000 mL (1,000 mLs Intravenous New Bag/Given 04/28/16 1457)    And  sodium chloride 0.9 % bolus 1,000 mL (1,000 mLs Intravenous New Bag/Given 04/28/16 1458)    And  sodium chloride 0.9 % bolus 1,000 mL (not administered)  sodium chloride 0.9 % injection (not administered)  vancomycin (VANCOCIN) IVPB 1000 mg/200 mL premix (not administered)  acetaminophen (TYLENOL) tablet 975 mg (975 mg Oral Given 04/28/16 1456)  iopamidol (ISOVUE-300) 61 % injection (80 mLs Intravenous Contrast Given 04/28/16 1554)  magnesium sulfate IVPB 2 g 50 mL (0 g Intravenous Stopped 04/28/16 1739)  piperacillin-tazobactam (ZOSYN) IVPB 3.375 g (0 g  Intravenous Stopped 04/28/16 1739)     Initial Impression / Assessment and Plan / ED Course  I have reviewed the triage vital signs and the nursing notes.  Pertinent labs & imaging results that were available during my care of the patient were reviewed by me and considered in my medical decision making (see chart for details).  Clinical Course     Vitals:   04/28/16 1324 04/28/16 1424 04/28/16 1628 04/28/16 1630  BP: (!) 137/48  150/67 160/67  Pulse: 100  87 87  Resp: 20  15 18  Temp: 102.7 F (39.3 C)  100 F (37.8 C)   TempSrc: Oral     SpO2: 97%  96% 97%  Weight:  121.6 kg    Height:  5\' 3"  (1.6 m)      Medications  sodium chloride 0.9 % bolus 1,000 mL (1,000 mLs Intravenous New Bag/Given 04/28/16 1457)    And  sodium chloride 0.9 % bolus 1,000 mL (1,000 mLs Intravenous New Bag/Given 04/28/16 1458)    And  sodium chloride 0.9 % bolus 1,000 mL (not administered)  sodium chloride 0.9 % injection (not administered)  vancomycin (VANCOCIN) IVPB 1000 mg/200 mL premix (not administered)  acetaminophen (TYLENOL) tablet 975 mg (975 mg Oral Given 04/28/16 1456)  iopamidol (ISOVUE-300) 61 % injection (80 mLs Intravenous Contrast Given 04/28/16 1554)  magnesium sulfate IVPB 2 g 50 mL (0 g Intravenous Stopped 04/28/16 1739)  piperacillin-tazobactam (ZOSYN) IVPB 3.375 g (0 g Intravenous Stopped 04/28/16 1739)    CONSWELLA GERATY is 65 y.o. female presenting with Generalized weakness and multiple episodes of emesis, she has some mild generalized abdominal discomfort. Patient febrile to 102.7. This is an insulin-dependent diabetic. Overall nontoxic appearing, blood pressure strong, no tachypnea or tachycardia, she is reporting cough however chest x-rays without infiltrate and she is saturating well on room air, lung sounds are clear to auscultation.   Mild leukocytosis of 12.6 however, she has a normal lactic acid level. Creatinine is at 1.32  Pending source definition patient is started  on Vanco and Zosyn for broad-spectrum coverage of her sepsis.  Urinalysis consistent with infection however it is contaminated but there are too numerous to count whites with many bacteria and large leukocytes, nitrites negative  CAT scan reveals a 4 cm left UVJ stone with hydronephrosis and perinephric standing, urology consulted for infected stone.  Discussed with Dr. Karsten Ro who will take her to the OR, advise her to remain nothing by mouth, she hasn't had anything by mouth since this a.m.  This is a shared visit with the attending physician who personally evaluated the patient and agrees with the care plan.   Final Clinical Impressions(s) / ED Diagnoses   Final diagnoses:  Acute cystitis with hematuria  Sepsis, due to unspecified organism [facility])  Kidney stone      Monico Blitz, PA-C 04/28/16 [place] Liu, MD 04/28/16 [phone]

## 2016-04-28 NOTE — ED Notes (Signed)
Delay in blood due to no IV access.  Several attempts made in ER.  Charge nurse starting ultrasound IV

## 2016-04-28 NOTE — Transfer of Care (Signed)
Immediate Anesthesia Transfer of Care Note  Patient: Angela Harvey  Procedure(s) Performed: Procedure(s): CYSTOSCOPY WITH RETROGRADE PYELOGRAM/URETERAL STENT PLACEMENT (Left)  Patient Location: PACU  Anesthesia Type:General  Level of Consciousness: sedated  Airway & Oxygen Therapy: Patient Spontanous Breathing and Patient connected to face mask oxygen  Post-op Assessment: Report given to RN and Post -op Vital signs reviewed and stable  Post vital signs: Reviewed and stable  Last Vitals:  Vitals:   04/28/16 1930 04/28/16 2000  BP: 151/66 176/91  Pulse: 84 87  Resp: 16 23  Temp:      Last Pain:  Vitals:   04/28/16 1324  TempSrc: Oral         Complications: No apparent anesthesia complications

## 2016-04-28 NOTE — ED Triage Notes (Addendum)
Patient c/o weakness for week and states that having walking at home. Patient doesn't have cane or walker at home to assist with ambulation.  Nausea and vomiting symptoms Wed-Friday.  Today has generalized abd pain that is sometimes constant and sometimes intermittent sharp pains.  Patient denies diarrhea or constipation. Patient last BM was this morning.

## 2016-04-29 ENCOUNTER — Encounter (HOSPITAL_COMMUNITY): Payer: Self-pay | Admitting: *Deleted

## 2016-04-29 DIAGNOSIS — B961 Klebsiella pneumoniae [K. pneumoniae] as the cause of diseases classified elsewhere: Secondary | ICD-10-CM | POA: Diagnosis present

## 2016-04-29 DIAGNOSIS — I10 Essential (primary) hypertension: Secondary | ICD-10-CM | POA: Diagnosis present

## 2016-04-29 DIAGNOSIS — N136 Pyonephrosis: Secondary | ICD-10-CM | POA: Diagnosis present

## 2016-04-29 DIAGNOSIS — Z794 Long term (current) use of insulin: Secondary | ICD-10-CM | POA: Diagnosis not present

## 2016-04-29 DIAGNOSIS — Z9049 Acquired absence of other specified parts of digestive tract: Secondary | ICD-10-CM | POA: Diagnosis not present

## 2016-04-29 DIAGNOSIS — N200 Calculus of kidney: Secondary | ICD-10-CM | POA: Diagnosis not present

## 2016-04-29 DIAGNOSIS — K219 Gastro-esophageal reflux disease without esophagitis: Secondary | ICD-10-CM | POA: Diagnosis present

## 2016-04-29 DIAGNOSIS — E119 Type 2 diabetes mellitus without complications: Secondary | ICD-10-CM | POA: Diagnosis present

## 2016-04-29 DIAGNOSIS — A419 Sepsis, unspecified organism: Secondary | ICD-10-CM | POA: Diagnosis present

## 2016-04-29 DIAGNOSIS — N3001 Acute cystitis with hematuria: Secondary | ICD-10-CM | POA: Diagnosis not present

## 2016-04-29 LAB — CBC
HCT: 32.6 % — ABNORMAL LOW (ref 36.0–46.0)
Hemoglobin: 10.7 g/dL — ABNORMAL LOW (ref 12.0–15.0)
MCH: 30.5 pg (ref 26.0–34.0)
MCHC: 32.8 g/dL (ref 30.0–36.0)
MCV: 92.9 fL (ref 78.0–100.0)
Platelets: 173 10*3/uL (ref 150–400)
RBC: 3.51 MIL/uL — ABNORMAL LOW (ref 3.87–5.11)
RDW: 13.6 % (ref 11.5–15.5)
WBC: 8.7 10*3/uL (ref 4.0–10.5)

## 2016-04-29 LAB — BASIC METABOLIC PANEL
Anion gap: 7 (ref 5–15)
BUN: 19 mg/dL (ref 6–20)
CO2: 25 mmol/L (ref 22–32)
Calcium: 8.3 mg/dL — ABNORMAL LOW (ref 8.9–10.3)
Chloride: 105 mmol/L (ref 101–111)
Creatinine, Ser: 1.24 mg/dL — ABNORMAL HIGH (ref 0.44–1.00)
GFR calc Af Amer: 51 mL/min — ABNORMAL LOW (ref >60)
GFR calc non Af Amer: 44 mL/min — ABNORMAL LOW (ref >60)
Glucose, Bld: 119 mg/dL — ABNORMAL HIGH (ref 65–99)
Potassium: 3.5 mmol/L (ref 3.5–5.1)
Sodium: 137 mmol/L (ref 135–145)

## 2016-04-29 LAB — GLUCOSE, CAPILLARY
Glucose-Capillary: 122 mg/dL — ABNORMAL HIGH (ref 65–99)
Glucose-Capillary: 124 mg/dL — ABNORMAL HIGH (ref 65–99)
Glucose-Capillary: 148 mg/dL — ABNORMAL HIGH (ref 65–99)
Glucose-Capillary: 116 mg/dL — ABNORMAL HIGH (ref 65–99)

## 2016-04-29 MED ORDER — ORAL CARE MOUTH RINSE
15.0000 mL | Freq: Two times a day (BID) | OROMUCOSAL | Status: DC
  Administered 2016-04-29: 15 mL via OROMUCOSAL

## 2016-04-29 MED ORDER — OXYBUTYNIN CHLORIDE 5 MG PO TABS
5.0000 mg | ORAL_TABLET | Freq: Three times a day (TID) | ORAL | Status: DC | PRN
  Administered 2016-04-29 (×3): 5 mg via ORAL
  Filled 2016-04-29 (×3): qty 1

## 2016-04-29 MED ORDER — ACETAMINOPHEN 325 MG PO TABS
650.0000 mg | ORAL_TABLET | ORAL | Status: DC | PRN
  Administered 2016-04-29 (×2): 650 mg via ORAL
  Filled 2016-04-29 (×2): qty 2

## 2016-04-29 NOTE — Progress Notes (Signed)
1 Day Post-Op  Assessment: 1.  Left distal ureteral stone - She has not been stented.  Her creatinine has improved slightly to 1.24.  The stent will remain indwelling until her stone is definitively treated. 2.  Possible UTI with urosepsis - her temperature has come down and her white blood cell count has now normalized. In my opinion based on laboratory and clinical factors I think the probability of urosepsis is low.  I found no purulence behind her stone when I placed her stent last night.  I have recommended continued observation and intravenous antibiotics with probable discharge in the morning.  Plan: 1.  Continue Foley catheter  2. Continue IV zosyn. 3.  Likely discharge in a.m.   Subjective: Patient is without complaint. She indicates that she feels well.  She is tolerating her stent with no flank pain.  She did have some mild irritative symptoms possibly due to the stent and/or her catheter.  Objective: Vital signs in last 24 hours: Temp:  [99 F (37.2 Harvey)-102.7 F (39.3 Harvey)] 100.7 F (38.2 Harvey) (11/19 0502) Pulse Rate:  [84-100] 91 (11/19 0502) Resp:  [15-23] 20 (11/19 0502) BP: (108-190)/(48-92) 148/60 (11/19 0502) SpO2:  [92 %-99 %] 92 % (11/19 0502) Weight:  [264 lb 12.4 oz (120.1 kg)-268 lb (121.6 kg)] 264 lb 12.4 oz (120.1 kg) (11/18 2231)  Intake/Output from previous day: 11/18 0701 - 11/19 0700 In: 2720 [I.V.:820; IV Piggyback:1400] Out: 600 [Urine:600] Intake/Output this shift: No intake/output data recorded.  Past Medical History:  Diagnosis Date  . Diabetes mellitus without complication ([place])   . Hiatal hernia   . Hypertension    Current Facility-Administered Medications  Medication Dose Route Frequency Provider Last Rate Last Dose  . 0.9 %  sodium chloride infusion   Intravenous Continuous Kathie Rhodes, MD 100 mL/hr at 04/28/16 2210    . acetaminophen (TYLENOL) tablet 650 mg  650 mg Oral Q4H PRN Kathie Rhodes, MD   650 mg at 04/29/16 0612  . insulin aspart  (novoLOG) injection 0-15 Units  0-15 Units Subcutaneous TID WC Kathie Rhodes, MD   2 Units at 04/29/16 0758  . ondansetron (ZOFRAN) injection 4 mg  4 mg Intravenous Q4H PRN Kathie Rhodes, MD      . oxybutynin (DITROPAN) tablet 5 mg  5 mg Oral Q8H PRN Kathie Rhodes, MD   5 mg at 04/29/16 0612  . piperacillin-tazobactam (ZOSYN) IVPB 3.375 g  3.375 g Intravenous Q8H Christine E Shade, RPH   3.375 g at 04/29/16 0758  . sodium chloride 0.9 % bolus 1,000 mL  1,000 mL Intravenous Once [employer], PA-Harvey      . vancomycin (VANCOCIN) 1,500 mg in sodium chloride 0.9 % 500 mL IVPB  1,500 mg Intravenous Q24H Randa Spike, RPH   1,500 mg at 04/28/16 2050    Physical Exam:  General: Patient is in no apparent distress Lungs: Normal respiratory effort, chest expands symmetrically. GI: The abdomen is soft and nontender without mass.    Lab Results:  Recent Labs  04/28/16 1446 04/29/16 0511  WBC 12.6* 8.7  HGB 11.5* 10.7*  HCT 35.5* 32.6*   BMET  Recent Labs  04/28/16 1446 04/29/16 0511  NA 132* 137  K 3.8 3.5  CL 97* 105  CO2 27 25  GLUCOSE 208* 119*  BUN 23* 19  CREATININE 1.32* 1.24*  CALCIUM 9.5 8.3*   No results for input(s): LABPT, INR in the last 72 hours. No results for input(s): LABURIN in the last  72 hours. Results for orders placed or performed during the hospital encounter of 08/08/14  Urine culture     Status: None   Collection Time: 08/09/14  8:00 PM  Result Value Ref Range Status   Specimen Description URINE, RANDOM  Final   Special Requests NONE  Final   Colony Count   Final    >=100,000 COLONIES/ML Performed at [facility]    Culture   Final    Multiple bacterial morphotypes present, none predominant. Suggest appropriate recollection if clinically indicated. Performed at [facility]    Report Status 08/11/2014 FINAL  Final  Surgical pcr screen     Status: None   Collection Time: 08/16/14  8:27 AM  Result Value Ref Range Status   MRSA,  PCR NEGATIVE NEGATIVE Final   Staphylococcus aureus NEGATIVE NEGATIVE Final    Comment:        The Xpert SA Assay (FDA approved for NASAL specimens in patients over 71 years of age), is one component of a comprehensive surveillance program.  Test performance has been validated by [facility] for patients greater than or equal to 67 year old. It is not intended to diagnose infection nor to guide or monitor treatment.     Studies/Results: Dg Chest 2 View  Result Date: 04/28/2016 CLINICAL DATA:  Generalized weakness EXAM: CHEST  2 VIEW COMPARISON:  Report of prior chest CT June 24, 2013 available; images from that study cannot be retrieved. FINDINGS: There is no edema or consolidation. There is slight atelectasis in the lingula. Heart size and pulmonary vascularity are normal. No adenopathy. There is mild degenerative change in the thoracic spine. IMPRESSION: Mild lingular atelectasis.  No edema or consolidation. Electronically Signed   By: Lowella Grip III M.D.   On: 04/28/2016 15:36   Ct Abdomen Pelvis W Contrast  Result Date: 04/28/2016 CLINICAL DATA:  Nausea, vomiting. EXAM: CT ABDOMEN AND PELVIS WITH CONTRAST TECHNIQUE: Multidetector CT imaging of the abdomen and pelvis was performed using the standard protocol following bolus administration of intravenous contrast. CONTRAST:  50mL ISOVUE-300 IOPAMIDOL (ISOVUE-300) INJECTION 61% COMPARISON:  08/10/2014 FINDINGS: Lower chest: 6 mm nodule in the left lower lobe, stable since prior CTs. Lung bases otherwise clear. Heart is normal size. Small hiatal hernia. Hepatobiliary: Prior cholecystectomy.  No focal hepatic abnormality. Pancreas: No focal abnormality or ductal dilatation. Spleen: No focal abnormality.  Normal size. Adrenals/Urinary Tract: There is mild to moderate left hydronephrosis and perinephric stranding due to a 4 mm left UVJ stone. No renal or ureteral stones or hydronephrosis on the right. Adrenal glands and urinary  bladder unremarkable. Stomach/Bowel: Appendix is normal. Stomach, large and small bowel grossly unremarkable. Postoperative changes in the region of the stomach. Vascular/Lymphatic: No evidence of aneurysm or adenopathy. Reproductive: Prior hysterectomy.  No adnexal masses. Other: No free fluid or free air. Musculoskeletal: No acute bony abnormality or focal bone lesion. Degenerative disc and facet disease in the lower lumbar spine. IMPRESSION: 4 mm left UVJ stone with left hydronephrosis and perinephric stranding. Electronically Signed   By: Rolm Baptise M.D.   On: 04/28/2016 16:17       Angela Harvey 04/29/2016, 9:15 AM

## 2016-04-29 NOTE — Anesthesia Postprocedure Evaluation (Signed)
Anesthesia Post Note  Patient: Angela Harvey  Procedure(s) Performed: Procedure(s) (LRB): CYSTOSCOPY WITH RETROGRADE PYELOGRAM/URETERAL STENT PLACEMENT (Left)  Patient location during evaluation: PACU Anesthesia Type: General Level of consciousness: awake and alert Pain management: pain level controlled Vital Signs Assessment: post-procedure vital signs reviewed and stable Respiratory status: spontaneous breathing, nonlabored ventilation, respiratory function stable and patient connected to nasal cannula oxygen Cardiovascular status: blood pressure returned to baseline and stable Postop Assessment: no signs of nausea or vomiting Anesthetic complications: no    Last Vitals:  Vitals:   04/29/16 0110 04/29/16 0502  BP: 108/70 (!) 148/60  Pulse:  91  Resp: 18 20  Temp: 37.2 C (!) 38.2 C    Last Pain:  Vitals:   04/29/16 0502  TempSrc: Oral                 Leovanni Bjorkman DAVID

## 2016-04-30 ENCOUNTER — Encounter (HOSPITAL_COMMUNITY): Payer: Self-pay | Admitting: Urology

## 2016-04-30 LAB — URINE CULTURE: Culture: >=100000 — AB

## 2016-04-30 LAB — GLUCOSE, CAPILLARY: Glucose-Capillary: 111 mg/dL — ABNORMAL HIGH (ref 65–99)

## 2016-04-30 MED ORDER — SULFAMETHOXAZOLE-TRIMETHOPRIM 800-160 MG PO TABS: 1.0000 | ORAL_TABLET | Freq: Two times a day (BID) | ORAL | 0 refills | Status: DC

## 2016-04-30 NOTE — Discharge Summary (Signed)
Physician Discharge Summary  Patient ID: Angela Harvey MRN: [id number] DOB/AGE: [birth year] 66 y.o.  Admit date: 04/28/2016 Discharge date: 04/30/2016  Admission Diagnoses: Kidney stone [N20.0] Acute cystitis with hematuria [N30.01] Sepsis, due to unspecified organism [facility]) [A41.9]  Discharge Diagnoses:  Active Problems:   Ureteral calculus Klebsiella UTI  Discharged Condition: good  Hospital Course: She presented to the emergency room with malaise and was noted to be febrile to 102. Her urine appeared infected and although she was experiencing increased urinary frequency she was not experiencing any other symptoms to suggest UTI. A CT scan was obtained that revealed a distal left ureteral stone with perinephric stranding concerning for infection above the stone and therefore she underwent urgent left double-J stent placement. She defervesced and her white blood cell count normalized. She is tolerating the stent with no pain. Her urine is clear. Her urine culture has grown Klebsiella. The final sensitivities remain pending and her blood cultures have been negative. We discussed discharge on oral antibiotics with changes to the antibiotic if necessary based on final culture sensitivities. She will then follow up with me as an outpatient to schedule definitive treatment of her distal left ureteral stone. She has otherwise felt ready for discharge at this time. She will be given a prescription for Septra DS.  Significant Diagnostic Studies: Dg Chest 2 View  Result Date: 04/28/2016 CLINICAL DATA:  Generalized weakness EXAM: CHEST  2 VIEW COMPARISON:  Report of prior chest CT June 24, 2013 available; images from that study cannot be retrieved. FINDINGS: There is no edema or consolidation. There is slight atelectasis in the lingula. Heart size and pulmonary vascularity are normal. No adenopathy. There is mild degenerative change in the thoracic spine. IMPRESSION: Mild lingular atelectasis.  No  edema or consolidation. Electronically Signed   By: Lowella Grip III M.D.   On: 04/28/2016 15:36   Ct Abdomen Pelvis W Contrast  Result Date: 04/28/2016 CLINICAL DATA:  Nausea, vomiting. EXAM: CT ABDOMEN AND PELVIS WITH CONTRAST TECHNIQUE: Multidetector CT imaging of the abdomen and pelvis was performed using the standard protocol following bolus administration of intravenous contrast. CONTRAST:  71mL ISOVUE-300 IOPAMIDOL (ISOVUE-300) INJECTION 61% COMPARISON:  08/10/2014 FINDINGS: Lower chest: 6 mm nodule in the left lower lobe, stable since prior CTs. Lung bases otherwise clear. Heart is normal size. Small hiatal hernia. Hepatobiliary: Prior cholecystectomy.  No focal hepatic abnormality. Pancreas: No focal abnormality or ductal dilatation. Spleen: No focal abnormality.  Normal size. Adrenals/Urinary Tract: There is mild to moderate left hydronephrosis and perinephric stranding due to a 4 mm left UVJ stone. No renal or ureteral stones or hydronephrosis on the right. Adrenal glands and urinary bladder unremarkable. Stomach/Bowel: Appendix is normal. Stomach, large and small bowel grossly unremarkable. Postoperative changes in the region of the stomach. Vascular/Lymphatic: No evidence of aneurysm or adenopathy. Reproductive: Prior hysterectomy.  No adnexal masses. Other: No free fluid or free air. Musculoskeletal: No acute bony abnormality or focal bone lesion. Degenerative disc and facet disease in the lower lumbar spine. IMPRESSION: 4 mm left UVJ stone with left hydronephrosis and perinephric stranding. Electronically Signed   By: Rolm Baptise M.D.   On: 04/28/2016 16:17    Discharge Exam: Blood pressure 132/64, pulse 60, temperature 98.6 F (37 C), temperature source Oral, resp. rate 18, height 5\' 3"  (1.6 m), weight 264 lb 12.4 oz (120.1 kg), SpO2 98 %.  She is awake, alert and oriented and in no distress. Normal respiratory effort Abdomen is soft Urine is clear  Disposition: 01-Home or  Self Care  Discharge Instructions    Foley catheter - discontinue    Complete by:  As directed       Follow-up Information    Claybon Jabs, MD.   Specialty:  Urology Why:  For an appointment in 1 week when you get home. Contact information: [address] [place] [address] [phone]           Signed: Claybon Jabs 04/30/2016, 7:05 AM

## 2016-04-30 NOTE — Care Management Note (Signed)
Case Management Note  Patient Details  Name: Angela Harvey MRN: [id number] Date of Birth: 17-Aug-1950  Subjective/Objective: 65 y/o f admitted w/ureteral calculus. From home.                   Action/Plan:d/c home no needs or orders.   Expected Discharge Date:                 Expected Discharge Plan:  Home/Self Care  In-House Referral:     Discharge planning Services  CM Consult  Post Acute Care Choice:    Choice offered to:     DME Arranged:    DME Agency:     HH Arranged:    [place] Agency:     Status of Service:  Completed, signed off  If discussed at [employer] of Stay Meetings, dates discussed:    Additional Comments:  Dessa Phi, RN 04/30/2016, 10:39 AM

## 2016-04-30 NOTE — Progress Notes (Addendum)
Went over all discharge paperwork with patient and family.  All questions answered.  Prescription and discharge paperwork given to patient.  Pt voided after removing foley catheter.  Pt discharged via wheelchair.  Pt denied any home needs.

## 2016-05-03 LAB — CULTURE, BLOOD (ROUTINE X 2)
Culture: NO GROWTH
Culture: NO GROWTH

## 2016-05-08 DIAGNOSIS — N201 Calculus of ureter: Secondary | ICD-10-CM | POA: Diagnosis not present

## 2016-05-15 DIAGNOSIS — N201 Calculus of ureter: Secondary | ICD-10-CM | POA: Diagnosis not present

## 2016-05-21 ENCOUNTER — Other Ambulatory Visit: Payer: Self-pay | Admitting: Urology

## 2016-06-07 ENCOUNTER — Encounter (HOSPITAL_BASED_OUTPATIENT_CLINIC_OR_DEPARTMENT_OTHER): Payer: Self-pay | Admitting: *Deleted

## 2016-06-12 ENCOUNTER — Encounter (HOSPITAL_BASED_OUTPATIENT_CLINIC_OR_DEPARTMENT_OTHER): Payer: Self-pay | Admitting: *Deleted

## 2016-06-12 NOTE — Progress Notes (Signed)
NPO AFTER MN.  ARRIVE AT 0715.  NEEDS ISTAT, KUB, AND EKG.  WILL TAKE COZAAR, PRILOSEC, AND GABAPENTIN AM DOS W/ SIPS OF WATER AND IF NEEDED TAKE TYLENOL .

## 2016-06-14 ENCOUNTER — Encounter (HOSPITAL_BASED_OUTPATIENT_CLINIC_OR_DEPARTMENT_OTHER): Payer: Self-pay | Admitting: Anesthesiology

## 2016-06-14 NOTE — H&P (Signed)
HPI: Angela Harvey is a 66 year-old female ureteral calculi after a surgical intervention.  The stone is on the left side. She has had stentfor treatment of her ureteral calculi. Patient denies ureteroscopy, eswl, and percutaneous lithotomy. This procedure was done 04/28/2016. She did not pass stones. This is her first kidney stone. She does have a stent in place.   On 04/28/16 he underwent left double-J stent placement for a distal left ureteral stone with infection and fever. When she was seen last a KUB revealed the stent was in good position and although there were several phleboliths in the pelvis that were noted on her CT scan and was an area of calcification that was somewhat linear in configuration and was to the stent somewhat displaced laterally.   Interval history 05/15/16: She has been doing well. She has not actually seen her stone pass. She is having some mild dysuria at the termination of her urination. She is not having any flank pain or fever.     ALLERGIES: None   MEDICATIONS: Celecoxib 200 mg capsule 1 capsule PO Daily  Gabapentin 100 mg capsule 1 capsule PO Daily  Losartan Potassium 50 mg tablet 1 tablet PO Daily  Novolog Flexpen 100 unit/ml insulin pen 1 PO Daily  Oseltamivir Phosphate 75 mg capsule 1 capsule PO Daily  Pantoprazole Sodium 40 mg tablet, delayed release 1 tablet PO Daily  Sertraline Hcl 100 mg tablet 1 tablet PO Daily  Tresiba Flextouch U-200 200 unit/ml (3 ml) insulin pen 1 PO Daily     Notes: Pt did not bring med list with her.   GU PSH: Cystoscopy Insert Stent, Left - 04/28/2016 Hysterectomy    NON-GU PSH: Stomach Surgery Procedure    GU PMH: Calculus Ureter, Left, It appears she may have passed stone. Will have pt RTC in 1 week for possible cysto/stent removal by Dr. Karsten Ro - 05/08/2016    NON-GU PMH: Other specified postprocedural states - 05/08/2016 Diabetes Type 2 GERD    FAMILY HISTORY: Death In The Family Father - Father Death In The  Family Mother - Mother prostate cancer in father - Father   SOCIAL HISTORY: Marital Status: Married Current Smoking Status: Patient has never smoked.  Has never drank.  Drinks 1 caffeinated drink per day. Patient's occupation is/was retired.    REVIEW OF SYSTEMS:    GU Review Female:   Patient reports burning /pain with urination. Patient denies frequent urination, hard to postpone urination, get up at night to urinate, leakage of urine, stream starts and stops, trouble starting your stream, have to strain to urinate, and currently pregnant.  Gastrointestinal (Upper):   Patient denies nausea, vomiting, and indigestion/ heartburn.  Gastrointestinal (Lower):   Patient denies diarrhea and constipation.  Constitutional:   Patient denies fever, night sweats, weight loss, and fatigue.  Skin:   Patient denies skin rash/ lesion and itching.  Eyes:   Patient denies blurred vision and double vision.  Ears/ Nose/ Throat:   Patient denies sore throat and sinus problems.  Hematologic/Lymphatic:   Patient denies swollen glands and easy bruising.  Cardiovascular:   Patient denies chest pains and leg swelling.  Respiratory:   Patient denies cough and shortness of breath.  Endocrine:   Patient denies excessive thirst.  Musculoskeletal:   Patient denies back pain and joint pain.  Neurological:   Patient denies headaches and dizziness.  Psychologic:   Patient denies depression and anxiety.   VITAL SIGNS:    BP 150/76 mmHg  Pulse 91 /  min   Physical Exam: General appearance: alert and appears stated age. Moderate obesity  Head: Normocephalic, without obvious abnormality, atraumatic Eyes: conjunctivae/corneas clear. EOM's intact.  Oropharynx: moist mucous membranes Neck: supple, symmetrical, trachea midline Resp: normal respiratory effort Cardio: regular rate and rhythm Back: symmetric, no curvature. ROM normal. Mild left CVAT  GI: soft, non-tender; bowel sounds normal; no masses,  no  organomegaly Pelvic: deferred Extremities: extremities normal, atraumatic, no cyanosis or edema Skin: Skin color normal. No visible rashes or lesions Neurologic: Grossly normal  PAST DATA REVIEWED:  Source Of History:  Patient  Records Review:   Previous Patient Records, POC Tool  Urine Test Review:   Urine Culture and Sensitivity  X-Ray Review: KUB: Reviewed Films. Her previous KUB was compared with a steady. C.T. Abdomen: Reviewed Films. Previous CT scan was compared with her last KUB.   Notes:                     Her urine culture at the time of her hospitalization grew Proteus. Her blood cultures were negative.   PROCEDURES:         KUB - 74000  A single view of the abdomen is obtained.      I note her stent is in good position. There is a linear calcification next to the stent which I showed the patient. I am suspicious that this is a left distal ureteral stone.         Urinalysis w/Scope Dipstick Dipstick Cont'd Micro  Color: Yellow Bilirubin: Neg WBC/hpf: 10 - 20/hpf  Appearance: Cloudy Ketones: Neg RBC/hpf: 20 - 40/hpf  Specific Gravity: 1.025 Blood: 3+ Bacteria: Mod (26-50/hpf)  pH: 6.0 Protein: 2+ Cystals: NS (Not Seen)  Glucose: Neg Urobilinogen: 0.2 Casts: NS (Not Seen)    Nitrites: Neg Trichomonas: Not Present    Leukocyte Esterase: 3+ Mucous: Not Present      Epithelial Cells: 10 - 20/hpf      Yeast: NS (Not Seen)      Sperm: Not Present    Notes: microscopic performed on unspun urine    ASSESSMENT:      ICD-10 Details  1 GU:   Calculus Ureter - N20.1 Stable - I told her that it appears she still has a stone in her left distal ureter. I can't be 100% sure of that and we discussed removing the stent and seeing if the stone past however because of the difficulty she had with it previously I'm a little reluctant to do that and therefore I have recommended ureteroscopic management. We discussed proceeding with ureteroscopy and I went over the procedure with her as  well as the outpatient nature of the procedure and anticipated postoperative course. I told her I may not have to leave a stent after the procedure this time. Because she is having some mild dysuria I will culture her urine as I need to be sure her urine is sterile before surgery and will also begin Pyridium for bladder discomfort.     PLAN: Cystoscopy with stent removal, retrograde pyelogram and possible ureteroscopy with stone extraction.

## 2016-06-14 NOTE — Anesthesia Preprocedure Evaluation (Addendum)
Anesthesia Evaluation  Patient identified by MRN, date of birth, ID band Patient awake    Reviewed: Allergy & Precautions, NPO status , Patient's Chart, lab work & pertinent test results  Airway Mallampati: I  TM Distance: >3 FB Neck ROM: Full    Dental no notable dental hx. (+) Teeth Intact   Pulmonary neg pulmonary ROS,    Pulmonary exam normal breath sounds clear to auscultation       Cardiovascular hypertension, Pt. on medications Normal cardiovascular exam Rhythm:Regular Rate:Normal     Neuro/Psych  Neuromuscular disease negative psych ROS   GI/Hepatic hiatal hernia, GERD  Medicated and Controlled,Hx/o acute pancreatitis Hx/o Gastric carcinoma S/P resection   Endo/Other  diabetes, Well Controlled, Type 2, Insulin DependentMorbid obesity  Renal/GU Left distal ureteral stone   negative genitourinary   Musculoskeletal   Abdominal (+) + obese,   Peds  Hematology  (+) anemia ,   Anesthesia Other Findings   Reproductive/Obstetrics                            Anesthesia Physical Anesthesia Plan  ASA: III  Anesthesia Plan: General   Post-op Pain Management:    Induction: Intravenous  Airway Management Planned: LMA  Additional Equipment:   Intra-op Plan:   Post-operative Plan: Extubation in OR  Informed Consent: I have reviewed the patients History and Physical, chart, labs and discussed the procedure including the risks, benefits and alternatives for the proposed anesthesia with the patient or authorized representative who has indicated his/her understanding and acceptance.   Dental advisory given  Plan Discussed with: Anesthesiologist, CRNA and Surgeon  Anesthesia Plan Comments:         Anesthesia Quick Evaluation

## 2016-06-15 ENCOUNTER — Ambulatory Visit (HOSPITAL_COMMUNITY): Payer: Medicare Other

## 2016-06-15 ENCOUNTER — Encounter (HOSPITAL_BASED_OUTPATIENT_CLINIC_OR_DEPARTMENT_OTHER): Payer: Self-pay

## 2016-06-15 ENCOUNTER — Ambulatory Visit (HOSPITAL_BASED_OUTPATIENT_CLINIC_OR_DEPARTMENT_OTHER)
Admission: RE | Admit: 2016-06-15 | Discharge: 2016-06-15 | Disposition: A | Discharge status: Home / Self Care | Payer: Medicare Other | Source: Ambulatory Visit | Attending: Urology | Admitting: Urology

## 2016-06-15 ENCOUNTER — Encounter (HOSPITAL_BASED_OUTPATIENT_CLINIC_OR_DEPARTMENT_OTHER)
Admission: RE | Disposition: A | Discharge status: Home / Self Care | Payer: Self-pay | Source: Ambulatory Visit | Attending: Urology

## 2016-06-15 ENCOUNTER — Ambulatory Visit (HOSPITAL_BASED_OUTPATIENT_CLINIC_OR_DEPARTMENT_OTHER): Payer: Medicare Other | Admitting: Anesthesiology

## 2016-06-15 DIAGNOSIS — Z6841 Body Mass Index (BMI) 40.0 and over, adult: Secondary | ICD-10-CM | POA: Diagnosis not present

## 2016-06-15 DIAGNOSIS — E119 Type 2 diabetes mellitus without complications: Secondary | ICD-10-CM | POA: Insufficient documentation

## 2016-06-15 DIAGNOSIS — Z794 Long term (current) use of insulin: Secondary | ICD-10-CM | POA: Insufficient documentation

## 2016-06-15 DIAGNOSIS — N201 Calculus of ureter: Secondary | ICD-10-CM | POA: Diagnosis not present

## 2016-06-15 DIAGNOSIS — Z79899 Other long term (current) drug therapy: Secondary | ICD-10-CM | POA: Insufficient documentation

## 2016-06-15 DIAGNOSIS — I10 Essential (primary) hypertension: Secondary | ICD-10-CM | POA: Insufficient documentation

## 2016-06-15 DIAGNOSIS — K219 Gastro-esophageal reflux disease without esophagitis: Secondary | ICD-10-CM | POA: Insufficient documentation

## 2016-06-15 DIAGNOSIS — N3289 Other specified disorders of bladder: Secondary | ICD-10-CM | POA: Diagnosis not present

## 2016-06-15 DIAGNOSIS — Z466 Encounter for fitting and adjustment of urinary device: Secondary | ICD-10-CM | POA: Diagnosis not present

## 2016-06-15 HISTORY — DX: Gastro-esophageal reflux disease without esophagitis: K21.9

## 2016-06-15 HISTORY — DX: Personal history of other diseases of the digestive system: Z87.19

## 2016-06-15 HISTORY — DX: Calculus of ureter: N20.1

## 2016-06-15 HISTORY — DX: Personal history of colonic polyps: Z86.010

## 2016-06-15 HISTORY — PX: CYSTOSCOPY WITH RETROGRADE PYELOGRAM, URETEROSCOPY AND STENT PLACEMENT: SHX5789

## 2016-06-15 HISTORY — DX: Type 2 diabetes mellitus without complications: Z79.4

## 2016-06-15 HISTORY — DX: Type 2 diabetes mellitus without complications: E11.9

## 2016-06-15 HISTORY — DX: Anemia, unspecified: D64.9

## 2016-06-15 HISTORY — DX: Personal history of colon polyps, unspecified: Z86.0100

## 2016-06-15 HISTORY — DX: Personal history of other malignant neoplasm of stomach: Z85.028

## 2016-06-15 LAB — POCT I-STAT, CHEM 8
BUN: 21 mg/dL — ABNORMAL HIGH (ref 6–20)
Calcium, Ion: 1.4 mmol/L (ref 1.15–1.40)
Chloride: 105 mmol/L (ref 101–111)
Creatinine, Ser: 1.2 mg/dL — ABNORMAL HIGH (ref 0.44–1.00)
Glucose, Bld: 171 mg/dL — ABNORMAL HIGH (ref 65–99)
HCT: 29 % — ABNORMAL LOW (ref 36.0–46.0)
Hemoglobin: 9.9 g/dL — ABNORMAL LOW (ref 12.0–15.0)
Potassium: 3.7 mmol/L (ref 3.5–5.1)
Sodium: 144 mmol/L (ref 135–145)
TCO2: 25 mmol/L (ref 0–100)

## 2016-06-15 LAB — GLUCOSE, CAPILLARY: Glucose-Capillary: 182 mg/dL — ABNORMAL HIGH (ref 65–99)

## 2016-06-15 SURGERY — CYSTOURETEROSCOPY, WITH RETROGRADE PYELOGRAM AND STENT INSERTION
Anesthesia: General | Site: Bladder | Laterality: Left

## 2016-06-15 MED ORDER — FENTANYL CITRATE (PF) 100 MCG/2ML IJ SOLN
INTRAMUSCULAR | Status: AC
  Filled 2016-06-15: qty 2

## 2016-06-15 MED ORDER — ONDANSETRON HCL 4 MG/2ML IJ SOLN
INTRAMUSCULAR | Status: AC
  Filled 2016-06-15: qty 2

## 2016-06-15 MED ORDER — LIDOCAINE 2% (20 MG/ML) 5 ML SYRINGE
INTRAMUSCULAR | Status: AC
  Filled 2016-06-15: qty 5

## 2016-06-15 MED ORDER — EPHEDRINE 5 MG/ML INJ
INTRAVENOUS | Status: AC
  Filled 2016-06-15: qty 10

## 2016-06-15 MED ORDER — CIPROFLOXACIN IN D5W 400 MG/200ML IV SOLN
INTRAVENOUS | Status: AC
  Filled 2016-06-15: qty 200

## 2016-06-15 MED ORDER — HYDROCODONE-ACETAMINOPHEN 10-325 MG PO TABS: 1.0000 | ORAL_TABLET | ORAL | 0 refills | Status: DC | PRN

## 2016-06-15 MED ORDER — MEPERIDINE HCL 25 MG/ML IJ SOLN
6.2500 mg | INTRAMUSCULAR | Status: DC | PRN
  Filled 2016-06-15: qty 1

## 2016-06-15 MED ORDER — METOCLOPRAMIDE HCL 5 MG/ML IJ SOLN
10.0000 mg | Freq: Once | INTRAMUSCULAR | Status: DC | PRN
  Filled 2016-06-15: qty 2

## 2016-06-15 MED ORDER — PROPOFOL 10 MG/ML IV BOLUS
INTRAVENOUS | Status: DC | PRN
  Administered 2016-06-15: 250 mg via INTRAVENOUS
  Administered 2016-06-15: 100 mg via INTRAVENOUS

## 2016-06-15 MED ORDER — PROPOFOL 10 MG/ML IV BOLUS
INTRAVENOUS | Status: AC
  Filled 2016-06-15: qty 20

## 2016-06-15 MED ORDER — PHENYLEPHRINE 40 MCG/ML (10ML) SYRINGE FOR IV PUSH (FOR BLOOD PRESSURE SUPPORT)
PREFILLED_SYRINGE | INTRAVENOUS | Status: AC
  Filled 2016-06-15: qty 10

## 2016-06-15 MED ORDER — EPHEDRINE SULFATE-NACL 50-0.9 MG/10ML-% IV SOSY
PREFILLED_SYRINGE | INTRAVENOUS | Status: DC | PRN
  Administered 2016-06-15 (×3): 10 mg via INTRAVENOUS
  Administered 2016-06-15: 20 mg via INTRAVENOUS

## 2016-06-15 MED ORDER — IOHEXOL 300 MG/ML  SOLN
INTRAMUSCULAR | Status: DC | PRN
  Administered 2016-06-15: 9 mL via URETHRAL

## 2016-06-15 MED ORDER — MIDAZOLAM HCL 5 MG/5ML IJ SOLN
INTRAMUSCULAR | Status: DC | PRN
  Administered 2016-06-15: 2 mg via INTRAVENOUS

## 2016-06-15 MED ORDER — DEXAMETHASONE SODIUM PHOSPHATE 10 MG/ML IJ SOLN
INTRAMUSCULAR | Status: AC
  Filled 2016-06-15: qty 1

## 2016-06-15 MED ORDER — METOCLOPRAMIDE HCL 5 MG/ML IJ SOLN
INTRAMUSCULAR | Status: AC
Start: 2016-06-15 — End: 2016-06-15
  Filled 2016-06-15: qty 2

## 2016-06-15 MED ORDER — MIDAZOLAM HCL 2 MG/2ML IJ SOLN
INTRAMUSCULAR | Status: AC
  Filled 2016-06-15: qty 2

## 2016-06-15 MED ORDER — SODIUM CHLORIDE 0.9 % IR SOLN
Status: DC | PRN
  Administered 2016-06-15: 3000 mL
  Administered 2016-06-15: 1000 mL

## 2016-06-15 MED ORDER — FENTANYL CITRATE (PF) 100 MCG/2ML IJ SOLN
INTRAMUSCULAR | Status: DC | PRN
  Administered 2016-06-15 (×2): 25 ug via INTRAVENOUS
  Administered 2016-06-15: 50 ug via INTRAVENOUS

## 2016-06-15 MED ORDER — CIPROFLOXACIN IN D5W 400 MG/200ML IV SOLN
400.0000 mg | INTRAVENOUS | Status: AC
  Administered 2016-06-15: 400 mg via INTRAVENOUS
  Filled 2016-06-15: qty 200

## 2016-06-15 MED ORDER — LACTATED RINGERS IV SOLN
INTRAVENOUS | Status: DC
  Administered 2016-06-15 (×2): via INTRAVENOUS
  Filled 2016-06-15: qty 1000

## 2016-06-15 MED ORDER — METOCLOPRAMIDE HCL 5 MG/ML IJ SOLN
INTRAMUSCULAR | Status: DC | PRN
  Administered 2016-06-15: 10 mg via INTRAVENOUS

## 2016-06-15 MED ORDER — CIPROFLOXACIN HCL 500 MG PO TABS: 500.0000 mg | ORAL_TABLET | Freq: Two times a day (BID) | ORAL | 0 refills | Status: DC

## 2016-06-15 MED ORDER — DEXAMETHASONE SODIUM PHOSPHATE 4 MG/ML IJ SOLN
INTRAMUSCULAR | Status: DC | PRN
  Administered 2016-06-15: 10 mg via INTRAVENOUS

## 2016-06-15 MED ORDER — PHENYLEPHRINE 40 MCG/ML (10ML) SYRINGE FOR IV PUSH (FOR BLOOD PRESSURE SUPPORT)
PREFILLED_SYRINGE | INTRAVENOUS | Status: DC | PRN
  Administered 2016-06-15 (×2): 80 ug via INTRAVENOUS
  Administered 2016-06-15: 160 ug via INTRAVENOUS

## 2016-06-15 MED ORDER — HYDROMORPHONE HCL 1 MG/ML IJ SOLN
0.2500 mg | INTRAMUSCULAR | Status: DC | PRN
  Filled 2016-06-15: qty 0.5

## 2016-06-15 MED ORDER — ONDANSETRON HCL 4 MG/2ML IJ SOLN
INTRAMUSCULAR | Status: DC | PRN
  Administered 2016-06-15: 4 mg via INTRAVENOUS

## 2016-06-15 MED ORDER — LIDOCAINE HCL (CARDIAC) 20 MG/ML IV SOLN
INTRAVENOUS | Status: DC | PRN
  Administered 2016-06-15: 100 mg via INTRAVENOUS

## 2016-06-15 MED FILL — CIPROFLOXACIN HCL 500 MG TA: 500 | 5 days supply | Qty: 10 | Fill #0

## 2016-06-15 MED FILL — HYDROCODON-APAP 10-325: 10-325 | 2 days supply | Qty: 12 | Fill #0

## 2016-06-15 SURGICAL SUPPLY — 15 items
BAG DRAIN URO-CYSTO SKYTR STRL (DRAIN) ×3 IMPLANT
BASKET DAKOTA 1.9FR 11X120 (BASKET) ×3 IMPLANT
BASKET ZERO TIP NITINOL 2.4FR (BASKET) ×3 IMPLANT
CATH INTERMIT  6FR 70CM (CATHETERS) ×3 IMPLANT
CLOTH BEACON ORANGE TIMEOUT ST (SAFETY) ×3 IMPLANT
FIBER LASER FLEXIVA 365 (UROLOGICAL SUPPLIES) IMPLANT
FIBER LASER TRAC TIP (UROLOGICAL SUPPLIES) IMPLANT
GLOVE BIO SURGEON STRL SZ8 (GLOVE) ×3 IMPLANT
GOWN STRL REUS W/TWL XL LVL3 (GOWN DISPOSABLE) ×6 IMPLANT
GUIDEWIRE ANG ZIPWIRE 038X150 (WIRE) IMPLANT
GUIDEWIRE STR DUAL SENSOR (WIRE) ×3 IMPLANT
IV NS IRRIG 3000ML ARTHROMATIC (IV SOLUTION) ×6 IMPLANT
KIT ROOM TURNOVER WOR (KITS) ×3 IMPLANT
MANIFOLD NEPTUNE II (INSTRUMENTS) ×3 IMPLANT
PACK CYSTO (CUSTOM PROCEDURE TRAY) ×3 IMPLANT

## 2016-06-15 NOTE — Op Note (Signed)
PATIENT:  Angela Harvey  PRE-OPERATIVE DIAGNOSIS: 1. Left ureteral stent 2. Left distal ureteral stone  POST-OPERATIVE DIAGNOSIS: Same  PROCEDURE: 1. Removal of left ureteral stent. 2. Left ureteroscopy and stone extraction  SURGEON:  Claybon Jabs  INDICATION: Angela Harvey is a 66 year old female who was emergently admitted to the hospital with an obstructing stone and symptoms of sepsis. She underwent emergent stent placement and has been maintained on antibiotic therapy. She returns today for definitive management of her left distal ureteral stone.  ANESTHESIA:  General  EBL:  Minimal  DRAINS: None  LOCAL MEDICATIONS USED:  None  SPECIMEN:  Stone given to patient  Description of procedure: After informed consent the patient was taken to the operating room and placed on the table in a supine position. General anesthesia was then administered. Once fully anesthetized the patient was moved to the dorsal lithotomy position and the genitalia were sterilely prepped and draped in standard fashion. An official timeout was then performed.  The 67 French cystoscope with 30 lens was then introduced into the bladder. The stent was identified with some encrustation on the intravesical portion of the stent. It was grasped with alligator forceps and withdrawn through the urethral meatus. I attempted to pass a 0.038 inch floppy-tipped guidewire through the stent but met resistance. I therefore left this at the meatus and inserted a 6 [place] open-ended ureteral catheter through the cystoscope. Through this I passed the guidewire into the left ureteral orifice and up the left ureter next to the stent under direct fluoroscopic control. This was passed into the area the renal pelvis and left in place. I then removed the stent and the cystoscope.  I then passed the inner portion of a ureteral access sheath over the guidewire to gently dilate the intramural ureter and then proceeded with ureteroscopy.  I used a 6 [place] rigid ureteroscope and passed this under direct vision into the left ureteral orifice and up the left ureter where the stone was identified. It was initially grasped with a [place] basket and withdrawn all the way to ureteral orifice but held appears was switched to a 0 tip nitinol basket was able to negotiate the stone out through the ureteral orifice and into the bladder. I then removed the ureteroscope, reinserted the cystoscope and irrigated out the stone. Because her ureteroscopy was atraumatic and the presence of a stent for some time I elected not to replace her stent. I therefore drained her bladder, removed the cystoscope and the patient was awakened and taken to the recovery room in stable and satisfactory condition. She tolerated procedure well no intraoperative complications.  She'll be maintained on Cipro 500 mg twice a day for 5 days and follow-up in the office in one week.  PLAN OF CARE: Discharge to home after PACU  PATIENT DISPOSITION:  PACU - hemodynamically stable.

## 2016-06-15 NOTE — Anesthesia Postprocedure Evaluation (Signed)
Anesthesia Post Note  Patient: Malkia Piechocki Kasperski  Procedure(s) Performed: Procedure(s) (LRB): CYSTOSCOPY STENT REMOVAL  LEFT RETROGRADE PYELOGRAM, URETEROSCOPY, STONE BASKETRY (Left) HOLMIUM LASER APPLICATION (Left)  Patient location during evaluation: PACU Anesthesia Type: General Level of consciousness: awake and alert and oriented Pain management: pain level controlled Vital Signs Assessment: post-procedure vital signs reviewed and stable Respiratory status: spontaneous breathing, nonlabored ventilation and respiratory function stable Cardiovascular status: blood pressure returned to baseline and stable Postop Assessment: no signs of nausea or vomiting Anesthetic complications: no       Last Vitals:  Vitals:   06/15/16 1015 06/15/16 1030  BP: 123/72 130/63  Pulse: 88 87  Resp: 18 12  Temp:      Last Pain:  Vitals:   06/15/16 0720  TempSrc: Oral                 Tyreesha Maharaj A.

## 2016-06-15 NOTE — Addendum Note (Signed)
Addendum  created 06/15/16 1141 by Josephine Igo, MD   Anesthesia Attestations filed

## 2016-06-15 NOTE — Discharge Instructions (Signed)

## 2016-06-15 NOTE — Transfer of Care (Signed)
  Last Vitals:  Vitals:   06/15/16 0720  BP: (!) 195/58  Pulse: 100  Resp: 18  Temp: 36.9 C    Last Pain:  Vitals:   06/15/16 0720  TempSrc: Oral      Patients Stated Pain Goal: 8 (06/15/16 0807) Immediate Anesthesia Transfer of Care Note  Patient: Angela Harvey  Procedure(s) Performed: Procedure(s) (LRB): CYSTOSCOPY STENT REMOVAL  LEFT RETROGRADE PYELOGRAM, URETEROSCOPY, STONE BASKETRY (Left) HOLMIUM LASER APPLICATION (Left)  Patient Location: PACU  Anesthesia Type: General  Level of Consciousness: awake, alert  and oriented  Airway & Oxygen Therapy: Patient Spontanous Breathing and Patient connected to face mask oxygen  Post-op Assessment: Report given to PACU RN and Post -op Vital signs reviewed and stable  Post vital signs: Reviewed and stable  Complications: No apparent anesthesia complications

## 2016-06-15 NOTE — Anesthesia Procedure Notes (Signed)
Procedure Name: LMA Insertion Date/Time: 06/15/2016 8:46 AM Performed by: Josephine Igo Pre-anesthesia Checklist: Patient identified, Emergency Drugs available, Suction available and Patient being monitored Patient Re-evaluated:Patient Re-evaluated prior to inductionOxygen Delivery Method: Circle system utilized Preoxygenation: Pre-oxygenation with 100% oxygen Intubation Type: IV induction Ventilation: Mask ventilation without difficulty LMA: LMA inserted LMA Size: 4.0 Number of attempts: 1 Airway Equipment and Method: Bite block Placement Confirmation: positive ETCO2 Tube secured with: Tape Dental Injury: Teeth and Oropharynx as per pre-operative assessment

## 2016-06-18 ENCOUNTER — Encounter (HOSPITAL_BASED_OUTPATIENT_CLINIC_OR_DEPARTMENT_OTHER): Payer: Self-pay | Admitting: Urology

## 2016-06-26 DIAGNOSIS — N201 Calculus of ureter: Secondary | ICD-10-CM | POA: Diagnosis not present

## 2016-08-02 DIAGNOSIS — M1990 Unspecified osteoarthritis, unspecified site: Secondary | ICD-10-CM | POA: Diagnosis not present

## 2016-08-02 DIAGNOSIS — M5416 Radiculopathy, lumbar region: Secondary | ICD-10-CM | POA: Diagnosis not present

## 2016-08-02 DIAGNOSIS — Z1389 Encounter for screening for other disorder: Secondary | ICD-10-CM | POA: Diagnosis not present

## 2016-08-02 DIAGNOSIS — Z6841 Body Mass Index (BMI) 40.0 and over, adult: Secondary | ICD-10-CM | POA: Diagnosis not present

## 2016-08-02 DIAGNOSIS — K219 Gastro-esophageal reflux disease without esophagitis: Secondary | ICD-10-CM | POA: Diagnosis not present

## 2016-08-02 DIAGNOSIS — I10 Essential (primary) hypertension: Secondary | ICD-10-CM | POA: Diagnosis not present

## 2016-08-02 DIAGNOSIS — E1165 Type 2 diabetes mellitus with hyperglycemia: Secondary | ICD-10-CM | POA: Diagnosis not present

## 2016-08-02 DIAGNOSIS — J3089 Other allergic rhinitis: Secondary | ICD-10-CM | POA: Diagnosis not present

## 2016-08-02 DIAGNOSIS — F418 Other specified anxiety disorders: Secondary | ICD-10-CM | POA: Diagnosis not present

## 2016-11-01 DIAGNOSIS — I10 Essential (primary) hypertension: Secondary | ICD-10-CM | POA: Diagnosis not present

## 2016-11-01 DIAGNOSIS — Z6841 Body Mass Index (BMI) 40.0 and over, adult: Secondary | ICD-10-CM | POA: Diagnosis not present

## 2016-11-01 DIAGNOSIS — E119 Type 2 diabetes mellitus without complications: Secondary | ICD-10-CM | POA: Diagnosis not present

## 2016-11-01 DIAGNOSIS — M1990 Unspecified osteoarthritis, unspecified site: Secondary | ICD-10-CM | POA: Diagnosis not present

## 2016-11-01 DIAGNOSIS — K219 Gastro-esophageal reflux disease without esophagitis: Secondary | ICD-10-CM | POA: Diagnosis not present

## 2016-11-21 DIAGNOSIS — H401131 Primary open-angle glaucoma, bilateral, mild stage: Secondary | ICD-10-CM | POA: Diagnosis not present

## 2016-12-10 DIAGNOSIS — Z6841 Body Mass Index (BMI) 40.0 and over, adult: Secondary | ICD-10-CM | POA: Diagnosis not present

## 2016-12-10 DIAGNOSIS — M25511 Pain in right shoulder: Secondary | ICD-10-CM | POA: Diagnosis not present

## 2016-12-19 DIAGNOSIS — M25511 Pain in right shoulder: Secondary | ICD-10-CM | POA: Diagnosis not present

## 2017-02-18 DIAGNOSIS — M25511 Pain in right shoulder: Secondary | ICD-10-CM | POA: Diagnosis not present

## 2017-02-18 DIAGNOSIS — K219 Gastro-esophageal reflux disease without esophagitis: Secondary | ICD-10-CM | POA: Diagnosis not present

## 2017-02-18 DIAGNOSIS — F418 Other specified anxiety disorders: Secondary | ICD-10-CM | POA: Diagnosis not present

## 2017-02-18 DIAGNOSIS — D519 Vitamin B12 deficiency anemia, unspecified: Secondary | ICD-10-CM | POA: Diagnosis not present

## 2017-02-18 DIAGNOSIS — J309 Allergic rhinitis, unspecified: Secondary | ICD-10-CM | POA: Diagnosis not present

## 2017-02-18 DIAGNOSIS — Z6841 Body Mass Index (BMI) 40.0 and over, adult: Secondary | ICD-10-CM | POA: Diagnosis not present

## 2017-02-18 DIAGNOSIS — R413 Other amnesia: Secondary | ICD-10-CM | POA: Diagnosis not present

## 2017-02-18 DIAGNOSIS — I10 Essential (primary) hypertension: Secondary | ICD-10-CM | POA: Diagnosis not present

## 2017-02-18 DIAGNOSIS — M5416 Radiculopathy, lumbar region: Secondary | ICD-10-CM | POA: Diagnosis not present

## 2017-02-18 DIAGNOSIS — E119 Type 2 diabetes mellitus without complications: Secondary | ICD-10-CM | POA: Diagnosis not present

## 2017-02-18 DIAGNOSIS — M1990 Unspecified osteoarthritis, unspecified site: Secondary | ICD-10-CM | POA: Diagnosis not present

## 2017-03-13 DIAGNOSIS — H401131 Primary open-angle glaucoma, bilateral, mild stage: Secondary | ICD-10-CM | POA: Diagnosis not present

## 2017-06-20 DIAGNOSIS — M5416 Radiculopathy, lumbar region: Secondary | ICD-10-CM | POA: Diagnosis not present

## 2017-06-20 DIAGNOSIS — F418 Other specified anxiety disorders: Secondary | ICD-10-CM | POA: Diagnosis not present

## 2017-06-20 DIAGNOSIS — Z1389 Encounter for screening for other disorder: Secondary | ICD-10-CM | POA: Diagnosis not present

## 2017-06-20 DIAGNOSIS — Z6841 Body Mass Index (BMI) 40.0 and over, adult: Secondary | ICD-10-CM | POA: Diagnosis not present

## 2017-06-20 DIAGNOSIS — E119 Type 2 diabetes mellitus without complications: Secondary | ICD-10-CM | POA: Diagnosis not present

## 2017-06-20 DIAGNOSIS — M1990 Unspecified osteoarthritis, unspecified site: Secondary | ICD-10-CM | POA: Diagnosis not present

## 2017-06-20 DIAGNOSIS — I10 Essential (primary) hypertension: Secondary | ICD-10-CM | POA: Diagnosis not present

## 2017-06-20 DIAGNOSIS — R413 Other amnesia: Secondary | ICD-10-CM | POA: Diagnosis not present

## 2017-06-20 DIAGNOSIS — M25511 Pain in right shoulder: Secondary | ICD-10-CM | POA: Diagnosis not present

## 2017-06-20 DIAGNOSIS — J309 Allergic rhinitis, unspecified: Secondary | ICD-10-CM | POA: Diagnosis not present

## 2017-06-26 DIAGNOSIS — N200 Calculus of kidney: Secondary | ICD-10-CM | POA: Diagnosis not present

## 2017-07-18 ENCOUNTER — Inpatient Hospital Stay (HOSPITAL_COMMUNITY)
Admission: EM | Admit: 2017-07-18 | Discharge: 2017-07-21 | DRG: 439 | Disposition: A | Discharge status: Home / Self Care | Payer: Medicare Other | Attending: Internal Medicine | Admitting: Internal Medicine

## 2017-07-18 ENCOUNTER — Encounter (HOSPITAL_COMMUNITY): Payer: Self-pay | Admitting: Emergency Medicine

## 2017-07-18 ENCOUNTER — Other Ambulatory Visit: Payer: Self-pay

## 2017-07-18 ENCOUNTER — Emergency Department (HOSPITAL_COMMUNITY): Payer: Medicare Other

## 2017-07-18 DIAGNOSIS — E860 Dehydration: Secondary | ICD-10-CM | POA: Diagnosis present

## 2017-07-18 DIAGNOSIS — E1165 Type 2 diabetes mellitus with hyperglycemia: Secondary | ICD-10-CM | POA: Diagnosis present

## 2017-07-18 DIAGNOSIS — Z79899 Other long term (current) drug therapy: Secondary | ICD-10-CM | POA: Diagnosis not present

## 2017-07-18 DIAGNOSIS — Z903 Acquired absence of stomach [part of]: Secondary | ICD-10-CM | POA: Diagnosis not present

## 2017-07-18 DIAGNOSIS — Z794 Long term (current) use of insulin: Secondary | ICD-10-CM

## 2017-07-18 DIAGNOSIS — Z6841 Body Mass Index (BMI) 40.0 and over, adult: Secondary | ICD-10-CM

## 2017-07-18 DIAGNOSIS — R112 Nausea with vomiting, unspecified: Secondary | ICD-10-CM | POA: Diagnosis not present

## 2017-07-18 DIAGNOSIS — K219 Gastro-esophageal reflux disease without esophagitis: Secondary | ICD-10-CM | POA: Diagnosis present

## 2017-07-18 DIAGNOSIS — K858 Other acute pancreatitis without necrosis or infection: Secondary | ICD-10-CM

## 2017-07-18 DIAGNOSIS — I10 Essential (primary) hypertension: Secondary | ICD-10-CM | POA: Diagnosis not present

## 2017-07-18 DIAGNOSIS — Z86010 Personal history of colonic polyps: Secondary | ICD-10-CM | POA: Diagnosis not present

## 2017-07-18 DIAGNOSIS — Z9049 Acquired absence of other specified parts of digestive tract: Secondary | ICD-10-CM | POA: Diagnosis not present

## 2017-07-18 DIAGNOSIS — E669 Obesity, unspecified: Secondary | ICD-10-CM | POA: Diagnosis present

## 2017-07-18 DIAGNOSIS — E876 Hypokalemia: Secondary | ICD-10-CM | POA: Diagnosis present

## 2017-07-18 DIAGNOSIS — Z8719 Personal history of other diseases of the digestive system: Secondary | ICD-10-CM

## 2017-07-18 DIAGNOSIS — Z85028 Personal history of other malignant neoplasm of stomach: Secondary | ICD-10-CM

## 2017-07-18 DIAGNOSIS — N179 Acute kidney failure, unspecified: Secondary | ICD-10-CM | POA: Diagnosis present

## 2017-07-18 DIAGNOSIS — D72829 Elevated white blood cell count, unspecified: Secondary | ICD-10-CM | POA: Diagnosis not present

## 2017-07-18 DIAGNOSIS — D729 Disorder of white blood cells, unspecified: Secondary | ICD-10-CM

## 2017-07-18 DIAGNOSIS — N289 Disorder of kidney and ureter, unspecified: Secondary | ICD-10-CM | POA: Diagnosis not present

## 2017-07-18 DIAGNOSIS — K8500 Idiopathic acute pancreatitis without necrosis or infection: Secondary | ICD-10-CM | POA: Diagnosis not present

## 2017-07-18 DIAGNOSIS — K449 Diaphragmatic hernia without obstruction or gangrene: Secondary | ICD-10-CM | POA: Diagnosis present

## 2017-07-18 DIAGNOSIS — R110 Nausea: Secondary | ICD-10-CM

## 2017-07-18 DIAGNOSIS — R000 Tachycardia, unspecified: Secondary | ICD-10-CM | POA: Diagnosis present

## 2017-07-18 DIAGNOSIS — Z90710 Acquired absence of both cervix and uterus: Secondary | ICD-10-CM | POA: Diagnosis not present

## 2017-07-18 DIAGNOSIS — Z87442 Personal history of urinary calculi: Secondary | ICD-10-CM | POA: Diagnosis not present

## 2017-07-18 DIAGNOSIS — E119 Type 2 diabetes mellitus without complications: Secondary | ICD-10-CM

## 2017-07-18 DIAGNOSIS — R1084 Generalized abdominal pain: Secondary | ICD-10-CM | POA: Diagnosis not present

## 2017-07-18 DIAGNOSIS — K859 Acute pancreatitis without necrosis or infection, unspecified: Secondary | ICD-10-CM | POA: Diagnosis present

## 2017-07-18 LAB — GLUCOSE, CAPILLARY: Glucose-Capillary: 343 mg/dL — ABNORMAL HIGH (ref 65–99)

## 2017-07-18 LAB — URINALYSIS, ROUTINE W REFLEX MICROSCOPIC
Bilirubin Urine: NEGATIVE
Glucose, UA: >=500 mg/dL — AB
Hgb urine dipstick: NEGATIVE
Ketones, ur: 80 mg/dL — AB
Leukocytes, UA: NEGATIVE
Nitrite: NEGATIVE
Protein, ur: NEGATIVE mg/dL
Specific Gravity, Urine: 1.022 (ref 1.005–1.030)
pH: 5 (ref 5.0–8.0)

## 2017-07-18 LAB — CBC
HCT: 44.7 % (ref 36.0–46.0)
Hemoglobin: 14.7 g/dL (ref 12.0–15.0)
MCH: 31.2 pg (ref 26.0–34.0)
MCHC: 32.9 g/dL (ref 30.0–36.0)
MCV: 94.9 fL (ref 78.0–100.0)
Platelets: 277 10*3/uL (ref 150–400)
RBC: 4.71 MIL/uL (ref 3.87–5.11)
RDW: 14.2 % (ref 11.5–15.5)
WBC: 17.1 10*3/uL — ABNORMAL HIGH (ref 4.0–10.5)

## 2017-07-18 LAB — COMPREHENSIVE METABOLIC PANEL
ALT: 20 U/L (ref 14–54)
AST: 30 U/L (ref 15–41)
Albumin: 4.6 g/dL (ref 3.5–5.0)
Alkaline Phosphatase: 48 U/L (ref 38–126)
Anion gap: 19 — ABNORMAL HIGH (ref 5–15)
BUN: 39 mg/dL — ABNORMAL HIGH (ref 6–20)
CO2: 29 mmol/L (ref 22–32)
Calcium: 12.2 mg/dL — ABNORMAL HIGH (ref 8.9–10.3)
Chloride: 91 mmol/L — ABNORMAL LOW (ref 101–111)
Creatinine, Ser: 1.33 mg/dL — ABNORMAL HIGH (ref 0.44–1.00)
GFR calc Af Amer: 46 mL/min — ABNORMAL LOW (ref >60)
GFR calc non Af Amer: 40 mL/min — ABNORMAL LOW (ref >60)
Glucose, Bld: 367 mg/dL — ABNORMAL HIGH (ref 65–99)
Potassium: 3.8 mmol/L (ref 3.5–5.1)
Sodium: 139 mmol/L (ref 135–145)
Total Bilirubin: 0.9 mg/dL (ref 0.3–1.2)
Total Protein: 7.7 g/dL (ref 6.5–8.1)

## 2017-07-18 LAB — DIFFERENTIAL
Basophils Absolute: 0 10*3/uL (ref 0.0–0.1)
Basophils Relative: 0 %
Eosinophils Absolute: 0 10*3/uL (ref 0.0–0.7)
Eosinophils Relative: 0 %
Lymphocytes Relative: 6 %
Lymphs Abs: 1 10*3/uL (ref 0.7–4.0)
Monocytes Absolute: 1.4 10*3/uL — ABNORMAL HIGH (ref 0.1–1.0)
Monocytes Relative: 8 %
Neutro Abs: 14.5 10*3/uL — ABNORMAL HIGH (ref 1.7–7.7)
Neutrophils Relative %: 86 %

## 2017-07-18 LAB — CBG MONITORING, ED: Glucose-Capillary: 357 mg/dL — ABNORMAL HIGH (ref 65–99)

## 2017-07-18 LAB — I-STAT CG4 LACTIC ACID, ED: Lactic Acid, Venous: 3.24 mmol/L (ref 0.5–1.9)

## 2017-07-18 LAB — LIPASE, BLOOD: Lipase: 780 U/L — ABNORMAL HIGH (ref 11–51)

## 2017-07-18 MED ORDER — GABAPENTIN 100 MG PO CAPS
100.0000 mg | ORAL_CAPSULE | Freq: Three times a day (TID) | ORAL | Status: DC
  Administered 2017-07-18 – 2017-07-21 (×8): 100 mg via ORAL
  Filled 2017-07-18 (×8): qty 1

## 2017-07-18 MED ORDER — ONDANSETRON HCL 4 MG/2ML IJ SOLN: 4.0000 mg | Freq: Four times a day (QID) | INTRAMUSCULAR | Status: DC | PRN

## 2017-07-18 MED ORDER — INSULIN GLARGINE 100 UNIT/ML ~~LOC~~ SOLN
10.0000 [IU] | Freq: Every day | SUBCUTANEOUS | Status: DC
Start: 2017-07-18 — End: 2017-07-21
  Administered 2017-07-18 – 2017-07-20 (×3): 10 [IU] via SUBCUTANEOUS
  Filled 2017-07-18 (×4): qty 0.1

## 2017-07-18 MED ORDER — ONDANSETRON HCL 4 MG PO TABS: 4.0000 mg | ORAL_TABLET | Freq: Four times a day (QID) | ORAL | Status: DC | PRN

## 2017-07-18 MED ORDER — INSULIN GLARGINE 100 UNIT/ML ~~LOC~~ SOLN: 10.0000 [IU] | Freq: Every day | SUBCUTANEOUS | Status: DC

## 2017-07-18 MED ORDER — ENOXAPARIN SODIUM 40 MG/0.4ML ~~LOC~~ SOLN
40.0000 mg | SUBCUTANEOUS | Status: DC
  Administered 2017-07-19 – 2017-07-21 (×3): 40 mg via SUBCUTANEOUS
  Filled 2017-07-18 (×3): qty 0.4

## 2017-07-18 MED ORDER — INSULIN ASPART 100 UNIT/ML ~~LOC~~ SOLN
0.0000 [IU] | SUBCUTANEOUS | Status: DC
  Administered 2017-07-18: 7 [IU] via SUBCUTANEOUS
  Administered 2017-07-19: 2 [IU] via SUBCUTANEOUS
  Administered 2017-07-19: 3 [IU] via SUBCUTANEOUS
  Administered 2017-07-19 (×2): 1 [IU] via SUBCUTANEOUS
  Administered 2017-07-19: 2 [IU] via SUBCUTANEOUS

## 2017-07-18 MED ORDER — SODIUM CHLORIDE 0.9 % IV SOLN
INTRAVENOUS | Status: DC
  Administered 2017-07-18 – 2017-07-20 (×6): via INTRAVENOUS

## 2017-07-18 MED ORDER — ONDANSETRON HCL 4 MG/2ML IJ SOLN
4.0000 mg | Freq: Once | INTRAMUSCULAR | Status: AC
  Administered 2017-07-18: 4 mg via INTRAVENOUS
  Filled 2017-07-18: qty 2

## 2017-07-18 MED ORDER — PROMETHAZINE HCL 25 MG/ML IJ SOLN
25.0000 mg | Freq: Once | INTRAMUSCULAR | Status: AC
  Administered 2017-07-18: 25 mg via INTRAVENOUS
  Filled 2017-07-18: qty 1

## 2017-07-18 MED ORDER — SODIUM CHLORIDE 0.9 % IJ SOLN
INTRAMUSCULAR | Status: AC
  Filled 2017-07-18: qty 50

## 2017-07-18 MED ORDER — SODIUM CHLORIDE 0.9 % IV BOLUS (SEPSIS)
2000.0000 mL | Freq: Once | INTRAVENOUS | Status: AC
  Administered 2017-07-18: 2000 mL via INTRAVENOUS

## 2017-07-18 MED ORDER — PANTOPRAZOLE SODIUM 40 MG PO TBEC
40.0000 mg | DELAYED_RELEASE_TABLET | Freq: Every day | ORAL | Status: DC
  Administered 2017-07-19: 40 mg via ORAL
  Filled 2017-07-18: qty 1

## 2017-07-18 MED ORDER — MORPHINE SULFATE (PF) 4 MG/ML IV SOLN
4.0000 mg | Freq: Once | INTRAVENOUS | Status: AC
  Administered 2017-07-18: 4 mg via INTRAVENOUS
  Filled 2017-07-18: qty 1

## 2017-07-18 MED ORDER — ACETAMINOPHEN 650 MG RE SUPP: 650.0000 mg | Freq: Four times a day (QID) | RECTAL | Status: DC | PRN

## 2017-07-18 MED ORDER — MORPHINE SULFATE (PF) 2 MG/ML IV SOLN: 2.0000 mg | INTRAVENOUS | Status: DC | PRN

## 2017-07-18 MED ORDER — SERTRALINE HCL 50 MG PO TABS
100.0000 mg | ORAL_TABLET | Freq: Every day | ORAL | Status: DC
  Administered 2017-07-19 – 2017-07-21 (×3): 100 mg via ORAL
  Filled 2017-07-18 (×3): qty 2

## 2017-07-18 MED ORDER — IOPAMIDOL (ISOVUE-300) INJECTION 61%
INTRAVENOUS | Status: AC
  Administered 2017-07-18: 80 mL
  Filled 2017-07-18: qty 100

## 2017-07-18 MED ORDER — ACETAMINOPHEN 325 MG PO TABS: 650.0000 mg | ORAL_TABLET | Freq: Four times a day (QID) | ORAL | Status: DC | PRN

## 2017-07-18 MED ORDER — MORPHINE SULFATE (PF) 4 MG/ML IV SOLN: 2.0000 mg | INTRAVENOUS | Status: DC | PRN

## 2017-07-18 MED ORDER — PROMETHAZINE HCL 25 MG/ML IJ SOLN: 12.5000 mg | Freq: Four times a day (QID) | INTRAMUSCULAR | Status: DC | PRN

## 2017-07-18 NOTE — ED Provider Notes (Signed)
[facility] DEPT Provider Note   CSN: [id number] Arrival date & time: 07/18/17  0941     History   Chief Complaint Chief Complaint  Patient presents with  . Abdominal Pain    HPI Angela Harvey is a 67 y.o. female with a PMHx of GERD, hiatal hernia, anemia, pancreatitis, kidney stones, DM2, HTN, prior gastric cancer in 1988 s/p resection and partial gastrectomy, and PSHx of cholecystectomy, hysterectomy, and ureteral stent removal on 06/15/16, who presents to the ED with complaints of generalized abdominal pain, nausea, and vomiting x 4 days.  She describes her pain as 8/10 constant sharp nonradiating generalized abdominal pain which worsens with activity and movement and has been unrelieved with Tylenol.  She reports that the pain initially started 4 days ago when the vomiting started, but has worsened over the last 1-2 days.  She reports associated chills, nausea, and too numerous to count episodes of nonbloody nonbilious emesis.  She has not recently changed any of her medications, and she drinks alcohol very seldomly, had a martini about 1 week ago but otherwise drinks very infrequently.  She denies any recent travel, sick contacts, suspicious food intake, or frequent NSAID use.  Her PCP is Dr. Reynaldo Minium, and her GI specialist was previously Dr. Lynnda Shields in [facility] and then Dr. Wallis Mart during her admission for biliary pancreatitis and got a cholecystectomy in 08/2014.  She states today's symptoms feel very similar to how it felt when she had pancreatitis at that time.   She denies fevers, CP, SOB, diarrhea/constipation, obstipation, melena, hematochezia, hematemesis, hematuria, dysuria, vaginal bleeding/discharge, myalgias, arthralgias, numbness, tingling, focal weakness, or any other complaints at this time.   The history is provided by the patient and medical records. No language interpreter was used.  Abdominal Pain   This is a new problem. The current  episode started more than 2 days ago. The problem occurs constantly. The problem has been gradually worsening. The pain is associated with an unknown factor. The pain is located in the generalized abdominal region. The quality of the pain is sharp. The pain is at a severity of 8/10. The pain is moderate. Associated symptoms include nausea and vomiting. Pertinent negatives include fever, diarrhea, flatus, hematochezia, melena, constipation, dysuria, hematuria, arthralgias and myalgias. The symptoms are aggravated by activity. Nothing relieves the symptoms. Her past medical history is significant for GERD.    Past Medical History:  Diagnosis Date  . Anemia   . GERD (gastroesophageal reflux disease)   . Hiatal hernia   . History of acute pancreatitis 07/2014  . History of colon polyps   . 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 Hypertension   . Left ureteral stone   . Type 2 diabetes mellitus treated with insulin [facility])     Patient Active Problem List   Diagnosis Date Noted  . Ureteral calculus 04/28/2016  . Severe protein-calorie malnutrition ([place]) 08/15/2014  . Malnutrition of moderate degree ([place]) 08/15/2014  . Essential hypertension 08/14/2014  . Diabetes mellitus, type II ([place]) 08/14/2014  . GERD (gastroesophageal reflux disease) 08/14/2014  . Acute biliary pancreatitis 08/08/2014    Past Surgical History:  Procedure Laterality Date  . CHOLECYSTECTOMY N/A 08/16/2014   Procedure: LAPAROSCOPIC CHOLECYSTECTOMY WITH INTRAOPERATIVE CHOLANGIOGRAM;  Surgeon: Kaylyn Lim, MD;  Location: [facility];  Service: General;  Laterality: N/A;  . CYSTOSCOPY W/ URETERAL STENT PLACEMENT  Left 04/28/2016   Procedure: CYSTOSCOPY WITH RETROGRADE PYELOGRAM/URETERAL STENT PLACEMENT;  Surgeon: Kathie Rhodes, MD;  Location: [facility];  Service: Urology;  Laterality: Left;  . CYSTOSCOPY WITH RETROGRADE PYELOGRAM,  URETEROSCOPY AND STENT PLACEMENT Left 06/15/2016   Procedure: CYSTOSCOPY STENT REMOVAL  LEFT RETROGRADE PYELOGRAM, URETEROSCOPY, STONE BASKETRY;  Surgeon: Kathie Rhodes, MD;  Location: [facility];  Service: Urology;  Laterality: Left;  . LAPAROSCOPIC PARTIAL GASTRECTOMY  1988   malignant tumor  . UMBILICAL HERNIA REPAIR  1970's  . VAGINAL HYSTERECTOMY  1980's    OB History    No data available       Home Medications    Prior to Admission medications   Medication Sig Start Date End Date Taking? Authorizing Provider  acetaminophen (TYLENOL) 500 MG tablet Take 1,000 mg by mouth every 6 (six) hours as needed.    [provider]  celecoxib (CELEBREX) 200 MG capsule Take 200 mg by mouth daily. 02/16/16   [provider]  ciprofloxacin (CIPRO) 500 MG tablet Take 1 tablet (500 mg total) by mouth 2 (two) times daily. 06/15/16   Kathie Rhodes, MD  gabapentin (NEURONTIN) 100 MG capsule Take 100 mg by mouth 3 (three) times daily. 02/10/16   [provider]  HYDROcodone-acetaminophen (NORCO) 10-325 MG tablet Take 1-2 tablets by mouth every 4 (four) hours as needed for moderate pain. Maximum dose per 24 hours - 8 pills 06/15/16   Kathie Rhodes, MD  losartan (COZAAR) 50 MG tablet Take 50 mg by mouth every morning.  10/17/15   [provider]  Multiple Vitamin (MULTIVITAMIN WITH MINERALS) TABS tablet Take 1 tablet by mouth daily.    [provider]  NOVOLOG FLEXPEN 100 UNIT/ML FlexPen Inject 10 Units into the skin 3 (three) times daily before meals.  04/15/16   [provider]  omeprazole (PRILOSEC) 20 MG capsule Take 20 mg by mouth every morning.     [provider]  TRESIBA FLEXTOUCH 200 UNIT/ML SOPN Inject 20 Units into the skin every morning.  02/18/16   [provider]    Family History No family history on file.  Social History Social History   Tobacco Use  . Smoking status: Never Smoker  . Smokeless tobacco: Never  Used  Substance Use Topics  . Alcohol use: No  . Drug use: No     Allergies   Patient has no known allergies.   Review of Systems Review of Systems  Constitutional: Positive for chills. Negative for fever.  Respiratory: Negative for shortness of breath.   Cardiovascular: Negative for chest pain.  Gastrointestinal: Positive for abdominal pain, nausea and vomiting. Negative for blood in stool, constipation, diarrhea, flatus, hematochezia and melena.  Genitourinary: Negative for dysuria, hematuria, vaginal bleeding and vaginal discharge.  Musculoskeletal: Negative for arthralgias and myalgias.  Skin: Negative for color change.  Allergic/Immunologic: Positive for immunocompromised state (DM2).  Neurological: Negative for weakness and numbness.  Psychiatric/Behavioral: Negative for confusion.   All other systems reviewed and are negative for acute change except as noted in the HPI.    Physical Exam Updated Vital Signs BP (!) 166/92 (BP Location: Right Arm)   Pulse (!) 112   Temp 99.2 F (37.3 C) (Oral)   Resp 18   SpO2 100%   Physical Exam  Constitutional: She is oriented to person, place, and time. She appears well-developed and well-nourished.  Non-toxic appearance. No distress.  Low grade temp 99.2, nontoxic, NAD, although appears to feel unwell. Looks dehydrated.  HENT:  Head: Normocephalic and atraumatic.  Mouth/Throat: Oropharynx is clear and moist. Mucous membranes are dry.  Dry lips and mouth  Eyes: Conjunctivae and EOM are normal. Right eye exhibits no discharge. Left eye exhibits no discharge.  Neck: Normal range of motion. Neck supple.  Cardiovascular: Normal rate, regular rhythm, normal heart sounds and intact distal pulses. Exam reveals no gallop and no friction rub.  No murmur heard. Mildly tachycardic in the 100-110s during triage but resolved during exam  Pulmonary/Chest: Effort normal and breath sounds normal. No respiratory distress. She has no decreased  breath sounds. She has no wheezes. She has no rhonchi. She has no rales.  Abdominal: Soft. Normal appearance and bowel sounds are normal. She exhibits no distension. There is tenderness in the right upper quadrant, epigastric area and left upper quadrant. There is no rigidity, no rebound, no guarding, no CVA tenderness, no tenderness at McBurney's point and negative Murphy's sign.  Soft, obese but nondistended, +BS throughout, with moderate epigastric and LUQ TTP and mild RUQ TTP, no r/g/r, neg murphy's, neg mcburney's, no CVA TTP   Musculoskeletal: Normal range of motion.  Neurological: She is alert and oriented to person, place, and time. She has normal strength. No sensory deficit.  Skin: Skin is warm, dry and intact. No rash noted.  Psychiatric: She has a normal mood and affect.  Nursing note and vitals reviewed.    ED Treatments / Results  Labs (all labs ordered are listed, but only abnormal results are displayed) Labs Reviewed  LIPASE, BLOOD - Abnormal; Notable for the following components:      Result Value   Lipase 780 (*)    All other components within normal limits  COMPREHENSIVE METABOLIC PANEL - Abnormal; Notable for the following components:   Chloride 91 (*)    Glucose, Bld 367 (*)    BUN 39 (*)    Creatinine, Ser 1.33 (*)    Calcium 12.2 (*)    GFR calc non Af Amer 40 (*)    GFR calc Af Amer 46 (*)    Anion gap 19 (*)    All other components within normal limits  CBC - Abnormal; Notable for the following components:   WBC 17.1 (*)    All other components within normal limits  URINALYSIS, ROUTINE W REFLEX MICROSCOPIC - Abnormal; Notable for the following components:   APPearance HAZY (*)    Glucose, UA >=500 (*)    Ketones, ur 80 (*)    Bacteria, UA MANY (*)    Squamous Epithelial / LPF 0-5 (*)    All other components within normal limits  DIFFERENTIAL - Abnormal; Notable for the following components:   Neutro Abs 14.5 (*)    Monocytes Absolute 1.4 (*)    All  other components within normal limits  I-STAT CG4 LACTIC ACID, ED - Abnormal; Notable for the following components:   Lactic Acid, Venous 3.24 (*)    All other components within normal limits  BLOOD GAS, VENOUS  PTH, INTACT AND CALCIUM  CBC  COMPREHENSIVE METABOLIC PANEL    EKG  EKG Interpretation None       Radiology Ct Abdomen Pelvis W Contrast  Result Date: 07/18/2017 CLINICAL DATA:  Generalized abdominal pain with nausea vomiting for several days. EXAM: CT ABDOMEN AND PELVIS WITH CONTRAST TECHNIQUE: Multidetector CT imaging of the abdomen and pelvis was performed using the standard protocol following bolus administration of intravenous contrast. CONTRAST:  71mL ISOVUE-300 IOPAMIDOL (ISOVUE-300) INJECTION 61% COMPARISON:  Abdominal  CT 04/28/2016 FINDINGS: Lower chest: Small hiatal hernia. Hepatobiliary: No focal liver abnormality is seen. Status post cholecystectomy. No biliary dilatation. Pancreas: Diffuse peripancreatic fat stranding and somewhat indistinct architecture of the pancreas. Spleen: Normal in size without focal abnormality. Adrenals/Urinary Tract: Adrenal glands are unremarkable. Kidneys are normal, without renal calculi, focal lesion, or hydronephrosis. Bladder is unremarkable. Stomach/Bowel: Stable postsurgical changes of the stomach. No evidence of small-bowel obstruction or inflammatory changes. Normal appearance of the colon. Vascular/Lymphatic: No significant vascular findings are present. No enlarged abdominal or pelvic lymph nodes. Reproductive: Status post hysterectomy. No adnexal masses. Other: Fat containing lower anterior abdominal wall hernia. 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 IMPRESSION: Probable acute pancreatitis. No evidence of pseudocyst formation. Please correlate clinically. Small hiatal hernia. Electronically Signed   By: Fidela Salisbury M.D.   On: 07/18/2017 18:55    Procedures Procedures (including critical care  time)  Medications Ordered in ED Medications  sodium chloride 0.9 % injection (not administered)  promethazine (PHENERGAN) injection 25 mg (not administered)  sodium chloride 0.9 % bolus 2,000 mL (2,000 mLs Intravenous New Bag/Given 07/18/17 1737)  ondansetron (ZOFRAN) injection 4 mg (4 mg Intravenous Given 07/18/17 1738)  morphine 4 MG/ML injection 4 mg (4 mg Intravenous Given 07/18/17 1738)  iopamidol (ISOVUE-300) 61 % injection (80 mLs  Contrast Given 07/18/17 1816)     Initial Impression / Assessment and Plan / ED Course  I have reviewed the triage vital signs and the nursing notes.  Pertinent labs & imaging results that were available during my care of the patient were reviewed by me and considered in my medical decision making (see chart for details).     67 y.o. female here with generalized abd pain/n/v x4 days. On exam, appears dehydrated, lips and mouth dry, with mild tachycardia in the 110s initially but this seemed to be resolved upon exam, temp 99.2, moderate upper abd TTP mostly in the LUQ and epigastric area, slightly in the RUQ but neg murphy's exam, nonperitoneal. Work up thus far reveals: lipase elevated at 780; CMP with gluc 367 but no bicarb abnormality, hypercalcemia 12.2, Cr 1.33/BUN 39 somewhat similar to prior values, but anion gap of 19 (will get lactic to see if it's from that; will also get VBG to see if it's from hyperglycemia); CBC with WBC 17.1, will add-on differential. U/A with 80 ketones which is likely from dehydration, 0-5 squamous and WBC/RBCs, many bacteria, neg leuks/nitrites, doubt UTI given lack of symptoms and convincing findings on U/A. Will get differential, lactic, VBG, and CT abd/pelv to evaluate for pancreatitis and any complications therein. Will give fluids, zofran, and morphine then reassess shortly. She will likely need admission. Discussed case with my attending Dr. Rogene Houston who agrees with plan.    7:13 PM Differential shows neutrophilic predominance  to leukocytosis. Lactic and VBG not yet done because of difficulty getting labs drawn, however doubt that pt has DKA, I suspect her anion gap is from dehydration/suspected lactic elevation. CT abd/pelv confirms acute pancreatitis without pseudocyst or necrosis. Pt feeling slightly better with regards to pain, but still having n/v, will give phenergan. Will proceed with admission.   7:47 PM Lactic 3.24, confirming likely source of anion gap. Dr. Alcario Drought of [facility] returning page and will admit. Holding orders to be placed by admitting team. Please see their notes for further documentation of care. I appreciate their help with this pleasant pt's care. Pt stable at time of admission.    Final Clinical Impressions(s) / ED  Diagnoses   Final diagnoses:  Idiopathic acute pancreatitis without infection or necrosis  Neutrophilic leukocytosis  Hypercalcemia  Nausea and vomiting in adult patient  Generalized abdominal pain  Type 2 diabetes mellitus with hyperglycemia, with long-term current use of insulin [facility])    ED Discharge Orders    [address], [place], [place] 07/18/17 1947    Fredia Sorrow, MD 07/18/17 2021

## 2017-07-18 NOTE — ED Notes (Signed)
RN attempted IV access without success 

## 2017-07-18 NOTE — ED Triage Notes (Addendum)
Pt complaint of generalized abdominal pain with n/v since Sunday.

## 2017-07-18 NOTE — ED Provider Notes (Addendum)
Medical screening examination/treatment/procedure(s) were conducted as a shared visit with non-physician practitioner(s) and myself.  I personally evaluated the patient during the encounter.   EKG Interpretation None       Patient seen by me along with the physician assistant.  Patient with a complaint of right-sided abdominal pain with nausea vomiting since Sunday.    Patient having difficulty keeping anything down.  Patient's labs as documented below show a markedly elevated lipase.  Consistent with pancreatitis.  Liver function tests otherwise are normal.  Patient had her gallbladder removed in the past.  Patient has a history of gastric cancer and has had a partial gastrectomy.  Based on this patient will have CT scan of her abdomen for further evaluation.  Overall patient will require admission IV fluids.  Patient's calcium elevated blood sugar elevated possibly could be cause for the pancreatitis.  Patient on exam is alert cooperative follows commands heart regular lungs clear bilaterally abdomen without tenderness to palpation.    Fredia Sorrow, MD 07/18/17 Angela Harvey    Fredia Sorrow, MD 07/18/17 2021

## 2017-07-18 NOTE — ED Notes (Signed)
ED TO INPATIENT HANDOFF REPORT  Name/Age/Gender Angela Harvey 67 y.o. female  Code Status    Code Status Orders  (From admission, onward)        Start     Ordered   07/18/17 1935  Full code  Continuous     07/18/17 1936    Code Status History    Date Active Date Inactive Code Status Order ID Comments User Context   04/28/2016 20:42 04/30/2016 13:18 Full Code [id number]  Kathie Rhodes, MD Inpatient   08/16/2014 12:10 08/17/2014 20:42 Full Code [id number]  Johnathan Hausen, MD Inpatient   08/08/2014 20:05 08/16/2014 12:10 Full Code [id number]  Tisovec, Fransico Him, MD Inpatient      Home/SNF/Other Home  Chief Complaint vomitting with weakness  Level of Care/Admitting Diagnosis ED Disposition    ED Disposition Condition [facility]: [facility] [[id number]]  Level of Care: Med-Surg [16]  Diagnosis: Acute pancreatitis [577.0.ICD-9-CM]  Admitting Physician: Etta Quill [[id number]]  Attending Physician: Etta Quill [[id number]]  Estimated length of stay: past midnight tomorrow  Certification:: I certify this patient will need inpatient services for at least 2 midnights  PT Class (Do Not Modify): Inpatient [101]  PT Acc Code (Do Not Modify): Private [1]       Medical History Past Medical History:  Diagnosis Date  . Anemia   . GERD (gastroesophageal reflux disease)   . Hiatal hernia   . History of acute pancreatitis 07/2014  . History of colon polyps   . 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 Hypertension   . Left ureteral stone   . Type 2 diabetes mellitus treated with insulin ([place])     Allergies No Known Allergies  IV Location/Drains/Wounds Patient Lines/Drains/Airways Status   Active Line/Drains/Airways    Name:   Placement date:   Placement time:   Site:   Days:   Peripheral IV 08/16/14 Right Wrist   08/16/14    0912     Wrist   1067   Peripheral IV 07/18/17 Left Forearm   07/18/17    1743    Forearm   less than 1   Incision (Closed) 08/16/14 Abdomen Other (Comment)   08/16/14    1015     1067   Incision (Closed) 04/28/16 Other (Comment) Left   04/28/16    2110     446   Incision (Closed) 06/15/16 Perineum Other (Comment)   06/15/16    0919     398   Incision - 4 Ports Abdomen Umbilicus Mid;Upper Right;Medial Right;Lateral   08/16/14    1010     1067   Wound / Incision (Open or Dehisced) 04/28/16 Other (Comment) Leg   04/28/16    2227    Leg   446          Labs/Imaging Results for orders placed or performed during the hospital encounter of 07/18/17 (from the past 48 hour(s))  Urinalysis, Routine w reflex microscopic     Status: Abnormal   Collection Time: 07/18/17 10:12 AM  Result Value Ref Range   Color, Urine YELLOW YELLOW   APPearance HAZY (A) CLEAR   Specific Gravity, Urine 1.022 1.005 - 1.030   pH 5.0 5.0 - 8.0   Glucose, UA >=500 (A) NEGATIVE mg/dL   Hgb urine dipstick NEGATIVE NEGATIVE   Bilirubin Urine NEGATIVE  NEGATIVE   Ketones, ur 80 (A) NEGATIVE mg/dL   Protein, ur NEGATIVE NEGATIVE mg/dL   Nitrite NEGATIVE NEGATIVE   Leukocytes, UA NEGATIVE NEGATIVE   RBC / HPF 0-5 0 - 5 RBC/hpf   WBC, UA 0-5 0 - 5 WBC/hpf   Bacteria, UA MANY (A) NONE SEEN   Squamous Epithelial / LPF 0-5 (A) NONE SEEN   Mucus PRESENT    Hyaline Casts, UA PRESENT    Granular Casts, UA PRESENT     Comment: Performed at [facility], [place] [address], [place], [place] [address]  Lipase, blood     Status: Abnormal   Collection Time: 07/18/17 10:23 AM  Result Value Ref Range   Lipase 780 (H) 11 - 51 U/L    Comment: RESULTS CONFIRMED BY MANUAL DILUTION Performed at [facility], [place] [address], [place], [place] [address]   Comprehensive metabolic panel     Status: Abnormal   Collection Time: 07/18/17 10:23 AM  Result Value Ref Range   Sodium 139 135 - 145 mmol/L   Potassium 3.8  3.5 - 5.1 mmol/L   Chloride 91 (L) 101 - 111 mmol/L   CO2 29 22 - 32 mmol/L   Glucose, Bld 367 (H) 65 - 99 mg/dL   BUN 39 (H) 6 - 20 mg/dL   Creatinine, Ser 1.33 (H) 0.44 - 1.00 mg/dL   Calcium 12.2 (H) 8.9 - 10.3 mg/dL   Total Protein 7.7 6.5 - 8.1 g/dL   Albumin 4.6 3.5 - 5.0 g/dL   AST 30 15 - 41 U/L   ALT 20 14 - 54 U/L   Alkaline Phosphatase 48 38 - 126 U/L   Total Bilirubin 0.9 0.3 - 1.2 mg/dL   GFR calc non Af Amer 40 (L) >60 mL/min   GFR calc Af Amer 46 (L) >60 mL/min    Comment: (NOTE) The eGFR has been calculated using the CKD EPI equation. This calculation has not been validated in all clinical situations. eGFR's persistently <60 mL/min signify possible Chronic Kidney Disease.    Anion gap 19 (H) 5 - 15    Comment: Performed at [facility], [place] [address], [place], [place] [address]  CBC     Status: Abnormal   Collection Time: 07/18/17 10:23 AM  Result Value Ref Range   WBC 17.1 (H) 4.0 - 10.5 K/uL   RBC 4.71 3.87 - 5.11 MIL/uL   Hemoglobin 14.7 12.0 - 15.0 g/dL   HCT 44.7 36.0 - 46.0 %   MCV 94.9 78.0 - 100.0 fL   MCH 31.2 26.0 - 34.0 pg   MCHC 32.9 30.0 - 36.0 g/dL   RDW 14.2 11.5 - 15.5 %   Platelets 277 150 - 400 K/uL    Comment: Performed at [facility], [place] [address], [place], [place] [address]  Differential     Status: Abnormal   Collection Time: 07/18/17 10:23 AM  Result Value Ref Range   Neutrophils Relative % 86 %   Neutro Abs 14.5 (H) 1.7 - 7.7 K/uL   Lymphocytes Relative 6 %   Lymphs Abs 1.0 0.7 - 4.0 K/uL   Monocytes Relative 8 %   Monocytes Absolute 1.4 (H) 0.1 - 1.0 K/uL   Eosinophils Relative 0 %   Eosinophils Absolute 0.0 0.0 - 0.7 K/uL   Basophils Relative 0 %   Basophils Absolute 0.0 0.0 - 0.1 K/uL    Comment: Performed at [facility], [place]  [address], [place], [place] [address]  I-Stat CG4 Lactic Acid, ED     Status: Abnormal   Collection Time: 07/18/17  7:21 PM  Result Value  Ref Range   Lactic Acid, Venous 3.24 (HH) 0.5 - 1.9 mmol/L   Comment NOTIFIED PHYSICIAN   CBG monitoring, ED     Status: Abnormal   Collection Time: 07/18/17  8:18 PM  Result Value Ref Range   Glucose-Capillary 357 (H) 65 - 99 mg/dL   Ct Abdomen Pelvis W Contrast  Result Date: 07/18/2017 CLINICAL DATA:  Generalized abdominal pain with nausea vomiting for several days. EXAM: CT ABDOMEN AND PELVIS WITH CONTRAST TECHNIQUE: Multidetector CT imaging of the abdomen and pelvis was performed using the standard protocol following bolus administration of intravenous contrast. CONTRAST:  11m ISOVUE-300 IOPAMIDOL (ISOVUE-300) INJECTION 61% COMPARISON:  Abdominal CT 04/28/2016 FINDINGS: Lower chest: Small hiatal hernia. Hepatobiliary: No focal liver abnormality is seen. Status post cholecystectomy. No biliary dilatation. Pancreas: Diffuse peripancreatic fat stranding and somewhat indistinct architecture of the pancreas. Spleen: Normal in size without focal abnormality. Adrenals/Urinary Tract: Adrenal glands are unremarkable. Kidneys are normal, without renal calculi, focal lesion, or hydronephrosis. Bladder is unremarkable. Stomach/Bowel: Stable postsurgical changes of the stomach. No evidence of small-bowel obstruction or inflammatory changes. Normal appearance of the colon. Vascular/Lymphatic: No significant vascular findings are present. No enlarged abdominal or pelvic lymph nodes. Reproductive: Status post hysterectomy. No adnexal masses. Other: Fat containing lower anterior abdominal wall hernia. 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 IMPRESSION: Probable acute pancreatitis. No evidence of pseudocyst formation. Please correlate clinically. Small hiatal hernia. Electronically Signed   By: DFidela SalisburyM.D.   On: 07/18/2017 18:55    Pending Labs Unresulted Labs (From admission, onward)   Start     Ordered   07/19/17 0500  CBC  Tomorrow morning,   R     07/18/17 1936    07/19/17 0500  Comprehensive metabolic panel  Tomorrow morning,   R     07/18/17 1936   07/18/17 1932  PTH, intact and calcium  Once,   R     07/18/17 1936   07/18/17 1823  Blood gas, venous  Once,   R     07/18/17 1822      Vitals/Pain Today's Vitals   07/18/17 1735 07/18/17 1741 07/18/17 1950 07/18/17 1951  BP:  (!) 166/98  (!) 154/64  Pulse:  87  94  Resp:  18  17  Temp:    98.3 F (36.8 C)  TempSrc:    Oral  SpO2:  100%  94%  PainSc: '8   5  5     ' Isolation Precautions No active isolations  Medications Medications  sodium chloride 0.9 % injection (not administered)  insulin aspart (novoLOG) injection 0-9 Units (not administered)  gabapentin (NEURONTIN) capsule 100 mg (not administered)  pantoprazole (PROTONIX) EC tablet 40 mg (not administered)  sertraline (ZOLOFT) tablet 100 mg (not administered)  0.9 %  sodium chloride infusion ( Intravenous New Bag/Given 07/18/17 1953)  acetaminophen (TYLENOL) tablet 650 mg (not administered)    Or  acetaminophen (TYLENOL) suppository 650 mg (not administered)  ondansetron (ZOFRAN) tablet 4 mg (not administered)    Or  ondansetron (ZOFRAN) injection 4 mg (not administered)  enoxaparin (LOVENOX) injection 40 mg (not administered)  morphine 2 MG/ML injection 2-4 mg (not administered)  promethazine (PHENERGAN) injection 12.5 mg (not administered)  insulin glargine (LANTUS) injection 10 Units (not administered)  sodium chloride 0.9 % bolus 2,000 mL (2,000  mLs Intravenous New Bag/Given 07/18/17 1737)  ondansetron (ZOFRAN) injection 4 mg (4 mg Intravenous Given 07/18/17 1738)  morphine 4 MG/ML injection 4 mg (4 mg Intravenous Given 07/18/17 1738)  iopamidol (ISOVUE-300) 61 % injection (80 mLs  Contrast Given 07/18/17 1816)  promethazine (PHENERGAN) injection 25 mg (25 mg Intravenous Given 07/18/17 1951)    Mobility walks

## 2017-07-18 NOTE — H&P (Signed)
History and Physical    Angela Harvey QPY:[id number] DOB: Jun 10, 1951 DOA: 07/18/2017  PCP: Burnard Bunting, MD  Patient coming from: Home  I have personally briefly reviewed patient's old medical records in [place]  Chief Complaint: Abd pain  HPI: Angela Harvey is a 67 y.o. female with medical history significant of DM2, HTN, gallstone pancreatitis in 2016 s/p cholecystectomy.  Patient presents to the ED with c/o generalized abd pain, nausea, vomiting.  Onset 4 days ago.  8/10 constant, sharp pain.  Generalized.  Worse with movement.  Tylenol doesn't help.  Drinks EtOH very seldomly, last had martini 1 week ago.  [name] was GI in 2016.  ED Course: Acute pancreatitis.  Creat 1.33, BUN 39, calcium 12.2.  Lactate 3.2.  WBC 17.  No other SIRS.  BPs 124P systolic.   Review of Systems: As per HPI otherwise 10 point review of systems negative.   Past Medical History:  Diagnosis Date  . Anemia   . GERD (gastroesophageal reflux disease)   . Hiatal hernia   . History of acute pancreatitis 07/2014  . History of colon polyps   . 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 Hypertension   . Left ureteral stone   . Type 2 diabetes mellitus treated with insulin [facility])     Past Surgical History:  Procedure Laterality Date  . CHOLECYSTECTOMY N/A 08/16/2014   Procedure: LAPAROSCOPIC CHOLECYSTECTOMY WITH INTRAOPERATIVE CHOLANGIOGRAM;  Surgeon: Kaylyn Lim, MD;  Location: [facility];  Service: General;  Laterality: N/A;  . CYSTOSCOPY W/ URETERAL STENT PLACEMENT Left 04/28/2016   Procedure: CYSTOSCOPY WITH RETROGRADE PYELOGRAM/URETERAL STENT PLACEMENT;  Surgeon: Kathie Rhodes, MD;  Location: [facility];  Service: Urology;  Laterality: Left;  . CYSTOSCOPY WITH RETROGRADE PYELOGRAM, URETEROSCOPY AND STENT PLACEMENT Left 06/15/2016   Procedure: CYSTOSCOPY STENT REMOVAL  LEFT RETROGRADE PYELOGRAM,  URETEROSCOPY, STONE BASKETRY;  Surgeon: Kathie Rhodes, MD;  Location: [facility];  Service: Urology;  Laterality: Left;  . LAPAROSCOPIC PARTIAL GASTRECTOMY  1988   malignant tumor  . UMBILICAL HERNIA REPAIR  1970's  . VAGINAL HYSTERECTOMY  1980's     reports that  has never smoked. she has never used smokeless tobacco. She reports that she does not drink alcohol or use drugs.  No Known Allergies  Family History  Problem Relation Age of Onset  . Renal cancer Neg Hx   . Renal Disease Neg Hx      Prior to Admission medications   Medication Sig Start Date End Date Taking? Authorizing Provider  acetaminophen (TYLENOL) 325 MG tablet Take 650 mg by mouth daily as needed (PAIN).   Yes [provider]  celecoxib (CELEBREX) 200 MG capsule Take 200 mg by mouth daily. 02/16/16  Yes [provider]  gabapentin (NEURONTIN) 100 MG capsule Take 100 mg by mouth 3 (three) times daily. 02/10/16  Yes [provider]  losartan (COZAAR) 50 MG tablet Take 50 mg by mouth every morning.  10/17/15  Yes [provider]  Multiple Vitamin (MULTIVITAMIN WITH MINERALS) TABS tablet Take 1 tablet by mouth daily.   Yes [provider]  NOVOLOG FLEXPEN 100 UNIT/ML FlexPen Inject 10 Units into the skin 3 (three) times daily before meals.  04/15/16  Yes [provider]  omeprazole (PRILOSEC) 20 MG capsule Take 20 mg by mouth every morning.    Yes [provider]  Phenylephrine HCl (AFRIN ALLERGY NA) Place 2 sprays into both nostrils daily as needed (ALLERGIES).   Yes [provider]  TRESIBA FLEXTOUCH 200 UNIT/ML SOPN Inject 20 Units into the skin every morning.  02/18/16  Yes [provider]  sertraline (ZOLOFT) 100 MG tablet Take 100 mg by mouth daily. 06/02/17   [provider]    Physical Exam: Vitals:   07/18/17 1010 07/18/17 1412 07/18/17 1741 07/18/17 1951  BP: (!) 185/72 (!) 166/92 (!) 166/98 (!) 154/64  Pulse: 99  (!) 112 87 94  Resp: 18 18 18 17   Temp: 98.3 F (36.8 C) 99.2 F (37.3 C)  98.3 F (36.8 C)  TempSrc: Oral Oral  Oral  SpO2: 99% 100% 100% 94%    Constitutional: NAD, calm, comfortable Eyes: PERRL, lids and conjunctivae normal ENMT: Mucous membranes are dry. Posterior pharynx clear of any exudate or lesions.Normal dentition.  Neck: normal, supple, no masses, no thyromegaly Respiratory: clear to auscultation bilaterally, no wheezing, no crackles. Normal respiratory effort. No accessory muscle use.  Cardiovascular: Regular rate and rhythm, no murmurs / rubs / gallops. No extremity edema. 2+ pedal pulses. No carotid bruits.  Abdomen: Obese, non-distended, mild epigastric tenderness. Musculoskeletal: no clubbing / cyanosis. No joint deformity upper and lower extremities. Good ROM, no contractures. Normal muscle tone.  Skin: no rashes, lesions, ulcers. No induration Neurologic: CN 2-12 grossly intact. Sensation intact, DTR normal. Strength 5/5 in all 4.  Psychiatric: Normal judgment and insight. Alert and oriented x 3. Normal mood.    Labs on Admission: I have personally reviewed following labs and imaging studies  CBC: Recent Labs  Lab 07/18/17 1023  WBC 17.1*  NEUTROABS 14.5*  HGB 14.7  HCT 44.7  MCV 94.9  PLT 329   Basic Metabolic Panel: Recent Labs  Lab 07/18/17 1023  NA 139  K 3.8  CL 91*  CO2 29  GLUCOSE 367*  BUN 39*  CREATININE 1.33*  CALCIUM 12.2*   GFR: CrCl cannot be calculated (Unknown ideal weight.). Liver Function Tests: Recent Labs  Lab 07/18/17 1023  AST 30  ALT 20  ALKPHOS 48  BILITOT 0.9  PROT 7.7  ALBUMIN 4.6   Recent Labs  Lab 07/18/17 1023  LIPASE 780*   No results for input(s): AMMONIA in the last 168 hours. Coagulation Profile: No results for input(s): INR, PROTIME in the last 168 hours. Cardiac Enzymes: No results for input(s): CKTOTAL, CKMB, CKMBINDEX, TROPONINI in the last 168 hours. BNP (last 3 results) No results for  input(s): PROBNP in the last 8760 hours. HbA1C: No results for input(s): HGBA1C in the last 72 hours. CBG: No results for input(s): GLUCAP in the last 168 hours. Lipid Profile: No results for input(s): CHOL, HDL, LDLCALC, TRIG, CHOLHDL, LDLDIRECT in the last 72 hours. Thyroid Function Tests: No results for input(s): TSH, T4TOTAL, FREET4, T3FREE, THYROIDAB in the last 72 hours. Anemia Panel: No results for input(s): VITAMINB12, FOLATE, FERRITIN, TIBC, IRON, RETICCTPCT in the last 72 hours. Urine analysis:    Component Value Date/Time   COLORURINE YELLOW 07/18/2017 1012   APPEARANCEUR HAZY (A) 07/18/2017 1012   LABSPEC 1.022 07/18/2017 1012   PHURINE 5.0 07/18/2017 1012   GLUCOSEU >=500 (A) 07/18/2017 1012   HGBUR NEGATIVE 07/18/2017 1012   BILIRUBINUR NEGATIVE 07/18/2017 1012   KETONESUR 80 (A) 07/18/2017 1012   PROTEINUR NEGATIVE 07/18/2017 1012   UROBILINOGEN 0.2 08/08/2014 1505   NITRITE NEGATIVE 07/18/2017 1012   LEUKOCYTESUR NEGATIVE 07/18/2017 1012    Radiological Exams  on Admission: Ct Abdomen Pelvis W Contrast  Result Date: 07/18/2017 CLINICAL DATA:  Generalized abdominal pain with nausea vomiting for several days. EXAM: CT ABDOMEN AND PELVIS WITH CONTRAST TECHNIQUE: Multidetector CT imaging of the abdomen and pelvis was performed using the standard protocol following bolus administration of intravenous contrast. CONTRAST:  7mL ISOVUE-300 IOPAMIDOL (ISOVUE-300) INJECTION 61% COMPARISON:  Abdominal CT 04/28/2016 FINDINGS: Lower chest: Small hiatal hernia. Hepatobiliary: No focal liver abnormality is seen. Status post cholecystectomy. No biliary dilatation. Pancreas: Diffuse peripancreatic fat stranding and somewhat indistinct architecture of the pancreas. Spleen: Normal in size without focal abnormality. Adrenals/Urinary Tract: Adrenal glands are unremarkable. Kidneys are normal, without renal calculi, focal lesion, or hydronephrosis. Bladder is unremarkable. Stomach/Bowel:  Stable postsurgical changes of the stomach. No evidence of small-bowel obstruction or inflammatory changes. Normal appearance of the colon. Vascular/Lymphatic: No significant vascular findings are present. No enlarged abdominal or pelvic lymph nodes. Reproductive: Status post hysterectomy. No adnexal masses. Other: Fat containing lower anterior abdominal wall hernia. 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 IMPRESSION: Probable acute pancreatitis. No evidence of pseudocyst formation. Please correlate clinically. Small hiatal hernia. Electronically Signed   By: Fidela Salisbury M.D.   On: 07/18/2017 18:55    EKG: Independently reviewed.  Assessment/Plan Principal Problem:   Acute pancreatitis Active Problems:   Essential hypertension   Diabetes mellitus, type II (HCC)   Hypercalcemia   Renal insufficiency    1. Acute pancreatitis - possibly due to hypercalcemia? 1. IVF: 3L bolus in ED then 125 cc/hr 2. NPO except meds for now 3. Morphine 4. zofran / phenergan 5. Repeat CBC / CMP in AM 6. CT shows no obvious necrosis complications 7. WBC 17k but no other SIRS, hold off on ABx for now. 8. LFTs nl, doubt retained stone 2. Hypercalcemia - 1. IVF 2. Repeat CMP in AM 3. PTH pending, further work up based on result 4. Denies taking Vit-D recently 3. HTN - 1. Holding losartan 4. Renal insufficiency - presumed mild AKI pre-renal 1. IVF 2. Repeat CMP in AM 3. Hold losartan 5. DM2 - 1. Will give lantus 10 QHS (half home dose of 20 degludec) 1. Didn't take this morning 2. Sensitive SSI Q4H for now  DVT prophylaxis: Lovenox Code Status: Full Family Communication: Husband at bedside Disposition Plan: Home after admit Consults called: None Admission status: Admit to inpatient - inpatient status for treatment of acute pancreatitis, poss AKI, hypercalcemia.   Etta Quill DO [facility] Pager [phone]  If 7AM-7PM, please contact day  team taking care of patient [email or web address] Password [email or web address]  07/18/2017, 8:14 PM

## 2017-07-19 LAB — COMPREHENSIVE METABOLIC PANEL
ALT: 17 U/L (ref 14–54)
AST: 29 U/L (ref 15–41)
Albumin: 3.8 g/dL (ref 3.5–5.0)
Alkaline Phosphatase: 41 U/L (ref 38–126)
Anion gap: 9 (ref 5–15)
BUN: 42 mg/dL — ABNORMAL HIGH (ref 6–20)
CO2: 32 mmol/L (ref 22–32)
Calcium: 10 mg/dL (ref 8.9–10.3)
Chloride: 101 mmol/L (ref 101–111)
Creatinine, Ser: 1.06 mg/dL — ABNORMAL HIGH (ref 0.44–1.00)
GFR calc Af Amer: >60 mL/min (ref >60)
GFR calc non Af Amer: 53 mL/min — ABNORMAL LOW (ref >60)
Glucose, Bld: 169 mg/dL — ABNORMAL HIGH (ref 65–99)
Potassium: 3.1 mmol/L — ABNORMAL LOW (ref 3.5–5.1)
Sodium: 142 mmol/L (ref 135–145)
Total Bilirubin: 0.7 mg/dL (ref 0.3–1.2)
Total Protein: 6.8 g/dL (ref 6.5–8.1)

## 2017-07-19 LAB — CBC
HCT: 40.7 % (ref 36.0–46.0)
Hemoglobin: 13.2 g/dL (ref 12.0–15.0)
MCH: 31.1 pg (ref 26.0–34.0)
MCHC: 32.4 g/dL (ref 30.0–36.0)
MCV: 96 fL (ref 78.0–100.0)
Platelets: 191 10*3/uL (ref 150–400)
RBC: 4.24 MIL/uL (ref 3.87–5.11)
RDW: 14.4 % (ref 11.5–15.5)
WBC: 15.7 10*3/uL — ABNORMAL HIGH (ref 4.0–10.5)

## 2017-07-19 LAB — GLUCOSE, CAPILLARY
Glucose-Capillary: 132 mg/dL — ABNORMAL HIGH (ref 65–99)
Glucose-Capillary: 146 mg/dL — ABNORMAL HIGH (ref 65–99)
Glucose-Capillary: 147 mg/dL — ABNORMAL HIGH (ref 65–99)
Glucose-Capillary: 172 mg/dL — ABNORMAL HIGH (ref 65–99)
Glucose-Capillary: 193 mg/dL — ABNORMAL HIGH (ref 65–99)
Glucose-Capillary: 236 mg/dL — ABNORMAL HIGH (ref 65–99)

## 2017-07-19 LAB — MAGNESIUM: Magnesium: 1.3 mg/dL — ABNORMAL LOW (ref 1.7–2.4)

## 2017-07-19 LAB — LIPID PANEL
Cholesterol: 139 mg/dL (ref 0–200)
HDL: 39 mg/dL — ABNORMAL LOW (ref >40)
LDL Cholesterol: 64 mg/dL (ref 0–99)
Total CHOL/HDL Ratio: 3.6 RATIO
Triglycerides: 178 mg/dL — ABNORMAL HIGH (ref <150)
VLDL: 36 mg/dL (ref 0–40)

## 2017-07-19 LAB — LACTIC ACID, PLASMA: Lactic Acid, Venous: 1.4 mmol/L (ref 0.5–1.9)

## 2017-07-19 LAB — LIPASE, BLOOD: Lipase: 653 U/L — ABNORMAL HIGH (ref 11–51)

## 2017-07-19 MED ORDER — PANTOPRAZOLE SODIUM 40 MG PO TBEC
40.0000 mg | DELAYED_RELEASE_TABLET | Freq: Two times a day (BID) | ORAL | Status: DC
  Administered 2017-07-19 – 2017-07-20 (×2): 40 mg via ORAL
  Filled 2017-07-19 (×2): qty 1

## 2017-07-19 MED ORDER — GI COCKTAIL ~~LOC~~
30.0000 mL | Freq: Three times a day (TID) | ORAL | Status: DC | PRN
  Administered 2017-07-19 – 2017-07-20 (×2): 30 mL via ORAL
  Filled 2017-07-19 (×2): qty 30

## 2017-07-19 MED ORDER — SODIUM CHLORIDE 0.9 % IV BOLUS (SEPSIS)
1000.0000 mL | Freq: Once | INTRAVENOUS | Status: AC
  Administered 2017-07-19: 1000 mL via INTRAVENOUS

## 2017-07-19 MED ORDER — AMLODIPINE BESYLATE 5 MG PO TABS
5.0000 mg | ORAL_TABLET | Freq: Every day | ORAL | Status: DC
  Administered 2017-07-19 – 2017-07-20 (×2): 5 mg via ORAL
  Filled 2017-07-19 (×2): qty 1

## 2017-07-19 MED ORDER — INSULIN ASPART 100 UNIT/ML ~~LOC~~ SOLN
0.0000 [IU] | Freq: Three times a day (TID) | SUBCUTANEOUS | Status: DC
  Administered 2017-07-19: 2 [IU] via SUBCUTANEOUS
  Administered 2017-07-20: 3 [IU] via SUBCUTANEOUS
  Administered 2017-07-20: 2 [IU] via SUBCUTANEOUS
  Administered 2017-07-20: 1 [IU] via SUBCUTANEOUS
  Administered 2017-07-20 – 2017-07-21 (×3): 2 [IU] via SUBCUTANEOUS

## 2017-07-19 MED ORDER — MAGNESIUM SULFATE 4 GM/100ML IV SOLN
4.0000 g | Freq: Once | INTRAVENOUS | Status: AC
  Administered 2017-07-19: 4 g via INTRAVENOUS
  Filled 2017-07-19: qty 100

## 2017-07-19 MED ORDER — POTASSIUM CHLORIDE CRYS ER 20 MEQ PO TBCR
40.0000 meq | EXTENDED_RELEASE_TABLET | ORAL | Status: AC
  Administered 2017-07-19 (×2): 40 meq via ORAL
  Filled 2017-07-19 (×2): qty 2

## 2017-07-19 NOTE — Care Management Note (Signed)
Case Management Note  Patient Details  Name: Angela Harvey MRN: [id number] Date of Birth: [birth year]  Subjective/Objective:                  Acute pancreatitis via xray.  Action/Plan: Date: July 19, 2017 Velva Harman, BSN, [place], [place] Chart and notes review for patient progress and needs. Will follow for case management and discharge needs. No cm or discharge needs present at time of this review. Next review date: [id number]  Expected Discharge Date:                  Expected Discharge Plan:  Home/Self Care  In-House Referral:     Discharge planning Services  CM Consult  Post Acute Care Choice:    Choice offered to:     DME Arranged:    DME Agency:     HH Arranged:    HH Agency:     Status of Service:  In process, will continue to follow  If discussed at Long Length of Stay Meetings, dates discussed:    Additional Comments:  Leeroy Cha, RN 07/19/2017, 8:56 AM

## 2017-07-19 NOTE — Progress Notes (Signed)
PROGRESS NOTE    Angela Harvey  NAT:[id number] DOB: Jul 19, 1950 DOA: 07/18/2017 PCP: Burnard Bunting, MD    Brief Narrative:  Angela Harvey is a 67 y.o. female with medical history significant of DM2, HTN, gallstone pancreatitis in 2016 s/p cholecystectomy.  Patient presents to the ED with c/o generalized abd pain, nausea, vomiting.  Onset 4 days ago.  8/10 constant, sharp pain.  Generalized.  Worse with movement.  Tylenol doesn't help.  Drinks EtOH very seldomly, last had martini 1 week ago.  [name] was GI in 2016.  ED Course: Acute pancreatitis.  Creat 1.33, BUN 39, calcium 12.2.  Lactate 3.2.  WBC 17.  No other SIRS.  BPs 427C systolic.     Assessment & Plan:   Principal Problem:   Acute pancreatitis Active Problems:   Essential hypertension   Diabetes mellitus, type II ([place])   Hypercalcemia   Renal insufficiency  #1 acute pancreatitis Questionable etiology.  Concern could be secondary to hypercalcemia.  Patient with prior history of gallstone pancreatitis status post cholecystectomy.  CT abdomen and pelvis was negative for any biliary dilatation.  LFTs were within normal limits.  Patient noted to have a leukocytosis likely reactive which is slowly trending down.  Patient with clinical improvement.  His levels trending down and currently at 653 from 780 on admission.  Give a bolus of 1 L normal saline and increase maintenance fluids 250 cc/h.  Placed on clear liquids.  Supportive care.  Pain management.  Follow.  2.  Hypercalcemia Questionable etiology.  May be secondary to dehydration.  Workup underway with PTH pending.  Calcium levels trending down with hydration.  Outpatient follow-up.  3.  Diabetes mellitus type 2 Hemoglobin A1c was 6.7 08/08/2014.  Check a hemoglobin A1c.  CBGs ranging from 146-236.  Continue current dose of Lantus and sliding scale insulin.  Follow.  4.  Hypertension Cozaar on hold due to renal insufficiency.  Placed on Norvasc 5 mg daily and  follow.  5.  Gastro-esophageal reflux disease PPI.  GI cocktail as needed.  6.  Acute kidney injury/renal insufficiency Secondary to prerenal azotemia.  Improving with hydration.  Follow.  7.  Hypokalemia/hypomagnesemia Replete.   DVT prophylaxis: Lovenox Code Status: Full Family Communication: Updated patient and husband at bedside. Disposition Plan: Likely home once tolerating oral diet with no further abdominal pain or nausea or emesis.  Hopefully in the next 24-48 hours.   Consultants:   None  Procedures:   CT abdomen and pelvis 07/18/2017  Antimicrobials:   None   Subjective: Laying in bed.  Patient states feeling better.  No further nausea or emesis.  Abdominal pain improved.  No chest pain.  No shortness of breath.  Objective: Vitals:   07/18/17 1951 07/18/17 2056 07/18/17 2137 07/19/17 0437  BP: (!) 154/64 135/72  (!) 148/73  Pulse: 94 86  77  Resp: 17 15  16   Temp: 98.3 F (36.8 C) 98.2 F (36.8 C)  98.1 F (36.7 C)  TempSrc: Oral Oral  Oral  SpO2: 94% 100%  99%  Weight:   114.9 kg (253 lb 4.9 oz)   Height:   5\' 3"  (1.6 m)     Intake/Output Summary (Last 24 hours) at 07/19/2017 1049 Last data filed at 07/19/2017 0447 Gross per 24 hour  Intake 1074.58 ml  Output -  Net 1074.58 ml   Filed Weights   07/18/17 2137  Weight: 114.9 kg (253 lb 4.9 oz)    Examination:  General exam: Appears  calm and comfortable  Respiratory system: Clear to auscultation. Respiratory effort normal. Cardiovascular system: S1 & S2 heard, RRR. No JVD, murmurs, rubs, gallops or clicks. No pedal edema. Gastrointestinal system: Abdomen is nondistended, soft and nontender. No organomegaly or masses felt. Normal bowel sounds heard. Central nervous system: Alert and oriented. No focal neurological deficits. Extremities: Symmetric 5 x 5 power. Skin: No rashes, lesions or ulcers Psychiatry: Judgement and insight appear normal. Mood & affect appropriate.     Data Reviewed: I  have personally reviewed following labs and imaging studies  CBC: Recent Labs  Lab 07/18/17 1023 07/19/17 0650  WBC 17.1* 15.7*  NEUTROABS 14.5*  --   HGB 14.7 13.2  HCT 44.7 40.7  MCV 94.9 96.0  PLT 277 366   Basic Metabolic Panel: Recent Labs  Lab 07/18/17 1023 07/19/17 0650  NA 139 142  K 3.8 3.1*  CL 91* 101  CO2 29 32  GLUCOSE 367* 169*  BUN 39* 42*  CREATININE 1.33* 1.06*  CALCIUM 12.2* 10.0  MG  --  1.3*   GFR: Estimated Creatinine Clearance: 62.1 mL/min (A) (by C-G formula based on SCr of 1.06 mg/dL (H)). Liver Function Tests: Recent Labs  Lab 07/18/17 1023 07/19/17 0650  AST 30 29  ALT 20 17  ALKPHOS 48 41  BILITOT 0.9 0.7  PROT 7.7 6.8  ALBUMIN 4.6 3.8   Recent Labs  Lab 07/18/17 1023 07/19/17 0650  LIPASE 780* 653*   No results for input(s): AMMONIA in the last 168 hours. Coagulation Profile: No results for input(s): INR, PROTIME in the last 168 hours. Cardiac Enzymes: No results for input(s): CKTOTAL, CKMB, CKMBINDEX, TROPONINI in the last 168 hours. BNP (last 3 results) No results for input(s): PROBNP in the last 8760 hours. HbA1C: No results for input(s): HGBA1C in the last 72 hours. CBG: Recent Labs  Lab 07/18/17 2018 07/18/17 2054 07/19/17 0032 07/19/17 0435  GLUCAP 357* 343* 236* 172*   Lipid Profile: Recent Labs    07/19/17 0650  CHOL 139  HDL 39*  LDLCALC 64  TRIG 178*  CHOLHDL 3.6   Thyroid Function Tests: No results for input(s): TSH, T4TOTAL, FREET4, T3FREE, THYROIDAB in the last 72 hours. Anemia Panel: No results for input(s): VITAMINB12, FOLATE, FERRITIN, TIBC, IRON, RETICCTPCT in the last 72 hours. Sepsis Labs: Recent Labs  Lab 07/18/17 1921  LATICACIDVEN 3.24*    No results found for this or any previous visit (from the past 240 hour(s)).       Radiology Studies: Ct Abdomen Pelvis W Contrast  Result Date: 07/18/2017 CLINICAL DATA:  Generalized abdominal pain with nausea vomiting for several days.  EXAM: CT ABDOMEN AND PELVIS WITH CONTRAST TECHNIQUE: Multidetector CT imaging of the abdomen and pelvis was performed using the standard protocol following bolus administration of intravenous contrast. CONTRAST:  51mL ISOVUE-300 IOPAMIDOL (ISOVUE-300) INJECTION 61% COMPARISON:  Abdominal CT 04/28/2016 FINDINGS: Lower chest: Small hiatal hernia. Hepatobiliary: No focal liver abnormality is seen. Status post cholecystectomy. No biliary dilatation. Pancreas: Diffuse peripancreatic fat stranding and somewhat indistinct architecture of the pancreas. Spleen: Normal in size without focal abnormality. Adrenals/Urinary Tract: Adrenal glands are unremarkable. Kidneys are normal, without renal calculi, focal lesion, or hydronephrosis. Bladder is unremarkable. Stomach/Bowel: Stable postsurgical changes of the stomach. No evidence of small-bowel obstruction or inflammatory changes. Normal appearance of the colon. Vascular/Lymphatic: No significant vascular findings are present. No enlarged abdominal or pelvic lymph nodes. Reproductive: Status post hysterectomy. No adnexal masses. Other: Fat containing lower anterior abdominal wall hernia.  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 IMPRESSION: Probable acute pancreatitis. No evidence of pseudocyst formation. Please correlate clinically. Small hiatal hernia. Electronically Signed   By: Fidela Salisbury M.D.   On: 07/18/2017 18:55        Scheduled Meds: . amLODipine  5 mg Oral Daily  . enoxaparin (LOVENOX) injection  40 mg Subcutaneous Q24H  . gabapentin  100 mg Oral TID  . insulin aspart  0-9 Units Subcutaneous Q4H  . insulin glargine  10 Units Subcutaneous QHS  . pantoprazole  40 mg Oral Daily  . potassium chloride  40 mEq Oral Q4H  . sertraline  100 mg Oral Daily   Continuous Infusions: . sodium chloride Stopped (07/19/17 0802)  . magnesium sulfate 1 - 4 g bolus IVPB       LOS: 1 day    Time spent: 35 mins    Irine Seal, MD [facility] Pager [phone]  If 7PM-7AM, please contact night-coverage [email or web address] Password [facility] 07/19/2017, 10:49 AM

## 2017-07-20 ENCOUNTER — Inpatient Hospital Stay (HOSPITAL_COMMUNITY): Payer: Medicare Other

## 2017-07-20 LAB — COMPREHENSIVE METABOLIC PANEL
ALT: 34 U/L (ref 14–54)
AST: 62 U/L — ABNORMAL HIGH (ref 15–41)
Albumin: 3.3 g/dL — ABNORMAL LOW (ref 3.5–5.0)
Alkaline Phosphatase: 54 U/L (ref 38–126)
Anion gap: 5 (ref 5–15)
BUN: 27 mg/dL — ABNORMAL HIGH (ref 6–20)
CO2: 28 mmol/L (ref 22–32)
Calcium: 8.2 mg/dL — ABNORMAL LOW (ref 8.9–10.3)
Chloride: 108 mmol/L (ref 101–111)
Creatinine, Ser: 0.79 mg/dL (ref 0.44–1.00)
GFR calc Af Amer: >60 mL/min (ref >60)
GFR calc non Af Amer: >60 mL/min (ref >60)
Glucose, Bld: 167 mg/dL — ABNORMAL HIGH (ref 65–99)
Potassium: 3.7 mmol/L (ref 3.5–5.1)
Sodium: 141 mmol/L (ref 135–145)
Total Bilirubin: 0.5 mg/dL (ref 0.3–1.2)
Total Protein: 5.9 g/dL — ABNORMAL LOW (ref 6.5–8.1)

## 2017-07-20 LAB — CBC
HCT: 37.4 % (ref 36.0–46.0)
Hemoglobin: 12 g/dL (ref 12.0–15.0)
MCH: 31.3 pg (ref 26.0–34.0)
MCHC: 32.1 g/dL (ref 30.0–36.0)
MCV: 97.7 fL (ref 78.0–100.0)
Platelets: 171 10*3/uL (ref 150–400)
RBC: 3.83 MIL/uL — ABNORMAL LOW (ref 3.87–5.11)
RDW: 14 % (ref 11.5–15.5)
WBC: 9 10*3/uL (ref 4.0–10.5)

## 2017-07-20 LAB — GLUCOSE, CAPILLARY
Glucose-Capillary: 159 mg/dL — ABNORMAL HIGH (ref 65–99)
Glucose-Capillary: 199 mg/dL — ABNORMAL HIGH (ref 65–99)
Glucose-Capillary: 145 mg/dL — ABNORMAL HIGH (ref 65–99)
Glucose-Capillary: 221 mg/dL — ABNORMAL HIGH (ref 65–99)

## 2017-07-20 LAB — LIPASE, BLOOD: Lipase: 104 U/L — ABNORMAL HIGH (ref 11–51)

## 2017-07-20 LAB — PTH, INTACT AND CALCIUM
Calcium, Total (PTH): 10.8 mg/dL — ABNORMAL HIGH (ref 8.7–10.3)
PTH: 8 pg/mL — ABNORMAL LOW (ref 15–65)

## 2017-07-20 LAB — HEMOGLOBIN A1C
Hgb A1c MFr Bld: 6.6 % — ABNORMAL HIGH (ref 4.8–5.6)
Mean Plasma Glucose: 142.72 mg/dL

## 2017-07-20 LAB — MAGNESIUM: Magnesium: 1.9 mg/dL (ref 1.7–2.4)

## 2017-07-20 MED ORDER — AMLODIPINE BESYLATE 10 MG PO TABS
10.0000 mg | ORAL_TABLET | Freq: Every day | ORAL | Status: DC
  Administered 2017-07-21: 10 mg via ORAL
  Filled 2017-07-20: qty 1

## 2017-07-20 MED ORDER — PANTOPRAZOLE SODIUM 40 MG PO TBEC
60.0000 mg | DELAYED_RELEASE_TABLET | Freq: Two times a day (BID) | ORAL | Status: DC
  Administered 2017-07-20 – 2017-07-21 (×2): 60 mg via ORAL
  Filled 2017-07-20 (×4): qty 1

## 2017-07-20 MED ORDER — AMLODIPINE BESYLATE 5 MG PO TABS
5.0000 mg | ORAL_TABLET | Freq: Once | ORAL | Status: AC
  Administered 2017-07-20: 5 mg via ORAL
  Filled 2017-07-20: qty 1

## 2017-07-20 NOTE — Progress Notes (Signed)
PROGRESS NOTE    Angela Harvey  JEH:[id number] DOB: [birth year] DOA: 07/18/2017 PCP: Burnard Bunting, MD    Brief Narrative:  Angela Harvey is a 67 y.o. female with medical history significant of DM2, HTN, gallstone pancreatitis in 2016 s/p cholecystectomy.  Patient presents to the ED with c/o generalized abd pain, nausea, vomiting.  Onset 4 days ago.  8/10 constant, sharp pain.  Generalized.  Worse with movement.  Tylenol doesn't help.  Drinks EtOH very seldomly, last had martini 1 week ago.  [name] was GI in 2016.  ED Course: Acute pancreatitis.  Creat 1.33, BUN 39, calcium 12.2.  Lactate 3.2.  WBC 17.  No other SIRS.  BPs 378H systolic.     Assessment & Plan:   Principal Problem:   Acute pancreatitis Active Problems:   Essential hypertension   Diabetes mellitus, type II ([place])   Hypercalcemia   Renal insufficiency  #1 acute pancreatitis Questionable etiology.  Concern could be secondary to hypercalcemia.  Patient with prior history of gallstone pancreatitis status post cholecystectomy.  CT abdomen and pelvis was negative for any biliary dilatation.  LFTs were within normal limits.  Patient noted to have a leukocytosis likely reactive which have trended down. Patient with clinical improvement.  Lipase levels trending down and currently at 104 from 653 from 780 on admission.  Patient currently tolerated a clear liquid diet and on a full liquid diet which she is tolerating.  If continued improvement will transition to a soft diet in the morning.  Decrease IV fluids 100 cc/h.  Supportive care.  Follow.    2.  Hypercalcemia Questionable etiology.  May be secondary to dehydration.  Per family patient noted to be ingesting loss of Tums recently.  Intact PTH was low at 8.  Total calcium was 10.8.  Calcium levels are trending down.  Likely needs repeat lab work done in the outpatient setting with outpatient follow-up with PCP.  3.  Diabetes mellitus type 2 Hemoglobin A1c was 6.7  08/08/2014.  Repeat hemoglobin A1c 6.6. CBGs ranging from 159-199.  Continue current dose of Lantus and sliding scale insulin.  Follow.  4.  Hypertension Cozaar on hold due to renal insufficiency.  Increase Norvasc to 10 mg daily.   5.  Gastro-esophageal reflux disease Increase PPI to 60 mg twice daily.  GI cocktail as needed.  6.  Acute kidney injury/renal insufficiency Secondary to prerenal azotemia.  Improved with hydration.  Decrease IV fluids to 100 cc/h.  Follow.   7.  Hypokalemia/hypomagnesemia Likely secondary to GI losses.  Repleted.    DVT prophylaxis: Lovenox Code Status: Full Family Communication: Updated patient and husband at bedside. Disposition Plan: Likely home once tolerating oral diet with no further abdominal pain or nausea or emesis.  Hopefully in the next 24-48 hours.   Consultants:   None  Procedures:   CT abdomen and pelvis 07/18/2017  Abdominal films 07/20/2017  Antimicrobials:   None   Subjective: Patient laying in bed.  Family at bedside.  Patient states had some nausea and emesis however family at bedside state that she did not have some emesis.  Patient tolerating full liquid diet.  Denies any chest pain or shortness of breath.  Patient complaining of some nausea.  Per family patient has been ingesting lots of Tums recently.  Objective: Vitals:   07/19/17 1430 07/19/17 2020 07/20/17 0600 07/20/17 0854  BP: (!) 130/53 (!) 133/97 (!) 162/88   Pulse: 87 84 81   Resp: 20 18 18  Temp: 98.3 F (36.8 C) 98.6 F (37 C) 98.4 F (36.9 C)   TempSrc: Oral Oral Oral   SpO2: 94% 91% 95%   Weight:    118.7 kg (261 lb 11 oz)  Height:        Intake/Output Summary (Last 24 hours) at 07/20/2017 1141 Last data filed at 07/20/2017 1119 Gross per 24 hour  Intake 3142.5 ml  Output 200 ml  Net 2942.5 ml   Filed Weights   07/18/17 2137 07/20/17 0854  Weight: 114.9 kg (253 lb 4.9 oz) 118.7 kg (261 lb 11 oz)    Examination:  General exam: Appears calm  and comfortable  Respiratory system: Lungs clear to auscultation bilaterally no wheezes, no crackles, no rhonchi.  Respiratory effort normal. Cardiovascular system: Regular rate and rhythm no murmurs rubs or gallops.  No JVD.  No lower extremity edema.  Gastrointestinal system: Abdomen is obese, soft, nontender, nondistended, positive bowel sounds.  No rebound.  No guarding.  Central nervous system: Alert and oriented. No focal neurological deficits. Extremities: Symmetric 5 x 5 power. Skin: No rashes, lesions or ulcers Psychiatry: Judgement and insight appear normal. Mood & affect appropriate.     Data Reviewed: I have personally reviewed following labs and imaging studies  CBC: Recent Labs  Lab 07/18/17 1023 07/19/17 0650 07/20/17 0639  WBC 17.1* 15.7* 9.0  NEUTROABS 14.5*  --   --   HGB 14.7 13.2 12.0  HCT 44.7 40.7 37.4  MCV 94.9 96.0 97.7  PLT 277 191 570   Basic Metabolic Panel: Recent Labs  Lab 07/18/17 1023 07/18/17 2130 07/19/17 0650 07/20/17 0639  NA 139  --  142 141  K 3.8  --  3.1* 3.7  CL 91*  --  101 108  CO2 29  --  32 28  GLUCOSE 367*  --  169* 167*  BUN 39*  --  42* 27*  CREATININE 1.33*  --  1.06* 0.79  CALCIUM 12.2* 10.8* 10.0 8.2*  MG  --   --  1.3* 1.9   GFR: Estimated Creatinine Clearance: 83.8 mL/min (by C-G formula based on SCr of 0.79 mg/dL). Liver Function Tests: Recent Labs  Lab 07/18/17 1023 07/19/17 0650 07/20/17 0639  AST 30 29 62*  ALT 20 17 34  ALKPHOS 48 41 54  BILITOT 0.9 0.7 0.5  PROT 7.7 6.8 5.9*  ALBUMIN 4.6 3.8 3.3*   Recent Labs  Lab 07/18/17 1023 07/19/17 0650 07/20/17 0639  LIPASE 780* 653* 104*   No results for input(s): AMMONIA in the last 168 hours. Coagulation Profile: No results for input(s): INR, PROTIME in the last 168 hours. Cardiac Enzymes: No results for input(s): CKTOTAL, CKMB, CKMBINDEX, TROPONINI in the last 168 hours. BNP (last 3 results) No results for input(s): PROBNP in the last 8760  hours. HbA1C: Recent Labs    07/20/17 0639  HGBA1C 6.6*   CBG: Recent Labs  Lab 07/19/17 1134 07/19/17 1608 07/19/17 2019 07/20/17 0731 07/20/17 1125  GLUCAP 132* 146* 193* 159* 199*   Lipid Profile: Recent Labs    07/19/17 0650  CHOL 139  HDL 39*  LDLCALC 64  TRIG 178*  CHOLHDL 3.6   Thyroid Function Tests: No results for input(s): TSH, T4TOTAL, FREET4, T3FREE, THYROIDAB in the last 72 hours. Anemia Panel: No results for input(s): VITAMINB12, FOLATE, FERRITIN, TIBC, IRON, RETICCTPCT in the last 72 hours. Sepsis Labs: Recent Labs  Lab 07/18/17 1921 07/19/17 1105  LATICACIDVEN 3.24* 1.4    No results found for  this or any previous visit (from the past 240 hour(s)).       Radiology Studies: Ct Abdomen Pelvis W Contrast  Result Date: 07/18/2017 CLINICAL DATA:  Generalized abdominal pain with nausea vomiting for several days. EXAM: CT ABDOMEN AND PELVIS WITH CONTRAST TECHNIQUE: Multidetector CT imaging of the abdomen and pelvis was performed using the standard protocol following bolus administration of intravenous contrast. CONTRAST:  90mL ISOVUE-300 IOPAMIDOL (ISOVUE-300) INJECTION 61% COMPARISON:  Abdominal CT 04/28/2016 FINDINGS: Lower chest: Small hiatal hernia. Hepatobiliary: No focal liver abnormality is seen. Status post cholecystectomy. No biliary dilatation. Pancreas: Diffuse peripancreatic fat stranding and somewhat indistinct architecture of the pancreas. Spleen: Normal in size without focal abnormality. Adrenals/Urinary Tract: Adrenal glands are unremarkable. Kidneys are normal, without renal calculi, focal lesion, or hydronephrosis. Bladder is unremarkable. Stomach/Bowel: Stable postsurgical changes of the stomach. No evidence of small-bowel obstruction or inflammatory changes. Normal appearance of the colon. Vascular/Lymphatic: No significant vascular findings are present. No enlarged abdominal or pelvic lymph nodes. Reproductive: Status post hysterectomy. No  adnexal masses. Other: Fat containing lower anterior abdominal wall hernia. 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 IMPRESSION: Probable acute pancreatitis. No evidence of pseudocyst formation. Please correlate clinically. Small hiatal hernia. Electronically Signed   By: Fidela Salisbury M.D.   On: 07/18/2017 18:55        Scheduled Meds: . amLODipine  5 mg Oral Daily  . enoxaparin (LOVENOX) injection  40 mg Subcutaneous Q24H  . gabapentin  100 mg Oral TID  . insulin aspart  0-9 Units Subcutaneous TID AC & HS  . insulin glargine  10 Units Subcutaneous QHS  . pantoprazole  40 mg Oral BID AC  . sertraline  100 mg Oral Daily   Continuous Infusions: . sodium chloride 125 mL/hr at 07/20/17 0833     LOS: 2 days    Time spent: 35 mins    Irine Seal, MD [facility] Pager [phone]  If 7PM-7AM, please contact night-coverage [email or web address] Password [email or web address] 07/20/2017, 11:41 AM

## 2017-07-21 DIAGNOSIS — K8500 Idiopathic acute pancreatitis without necrosis or infection: Principal | ICD-10-CM

## 2017-07-21 LAB — COMPREHENSIVE METABOLIC PANEL
ALT: 37 U/L (ref 14–54)
AST: 40 U/L (ref 15–41)
Albumin: 3.2 g/dL — ABNORMAL LOW (ref 3.5–5.0)
Alkaline Phosphatase: 63 U/L (ref 38–126)
Anion gap: 6 (ref 5–15)
BUN: 15 mg/dL (ref 6–20)
CO2: 23 mmol/L (ref 22–32)
Calcium: 8.1 mg/dL — ABNORMAL LOW (ref 8.9–10.3)
Chloride: 107 mmol/L (ref 101–111)
Creatinine, Ser: 0.65 mg/dL (ref 0.44–1.00)
GFR calc Af Amer: >60 mL/min (ref >60)
GFR calc non Af Amer: >60 mL/min (ref >60)
Glucose, Bld: 180 mg/dL — ABNORMAL HIGH (ref 65–99)
Potassium: 3.7 mmol/L (ref 3.5–5.1)
Sodium: 136 mmol/L (ref 135–145)
Total Bilirubin: 0.5 mg/dL (ref 0.3–1.2)
Total Protein: 6.1 g/dL — ABNORMAL LOW (ref 6.5–8.1)

## 2017-07-21 LAB — GLUCOSE, CAPILLARY
Glucose-Capillary: 175 mg/dL — ABNORMAL HIGH (ref 65–99)
Glucose-Capillary: 172 mg/dL — ABNORMAL HIGH (ref 65–99)

## 2017-07-21 LAB — LIPASE, BLOOD: Lipase: 83 U/L — ABNORMAL HIGH (ref 11–51)

## 2017-07-21 MED ORDER — PANTOPRAZOLE SODIUM 20 MG PO TBEC: 60.0000 mg | DELAYED_RELEASE_TABLET | Freq: Two times a day (BID) | ORAL | 0 refills | Status: DC

## 2017-07-21 MED ORDER — AMLODIPINE BESYLATE 10 MG PO TABS: 10.0000 mg | ORAL_TABLET | Freq: Every day | ORAL | 0 refills | Status: DC

## 2017-07-21 NOTE — Discharge Summary (Signed)
Physician Discharge Summary  Angela Harvey NWG:[id number] DOB: July 08, 1950 DOA: 07/18/2017  PCP: Burnard Bunting, MD  Admit date: 07/18/2017 Discharge date: 07/21/2017  Time spent: 55 minutes  Recommendations for Outpatient Follow-up:  1. Follow-up with Burnard Bunting, MD in 2 weeks.  On follow-up patient will need a basic metabolic profile done to follow-up on electrolytes and renal function.  Patient also need a magnesium level checked.  Patient's hypercalcemia need to be reassessed at that time.  Patient's blood pressure also need to be reassessed as patient's Cozaar was discontinued and patient started on Norvasc.   Discharge Diagnoses:  Principal Problem:   Acute pancreatitis Active Problems:   Essential hypertension   Diabetes mellitus, type II ([place])   Hypercalcemia   Renal insufficiency   Discharge Condition: Stable and improved  Diet recommendation: Carb modified diet.  Filed Weights   07/18/17 2137 07/20/17 0854 07/21/17 0509  Weight: 114.9 kg (253 lb 4.9 oz) 118.7 kg (261 lb 11 oz) 119.5 kg (263 lb 7.2 oz)    History of present illness:  Per Dr.Gardner  Angela Harvey is a 67 y.o. female with medical history significant of DM2, HTN, gallstone pancreatitis in 2016 s/p cholecystectomy.  Patient presented to the ED with c/o generalized abd pain, nausea, vomiting.  Onset 4 days prior to admission.  8/10 constant, sharp pain.  Generalized.  Worse with movement.  Tylenol didn't help.  Drinks EtOH very seldomly, last had martini 1 week ago.  [name] was GI in 2016.  ED Course: Acute pancreatitis.  Creat 1.33, BUN 39, calcium 12.2.  Lactate 3.2.  WBC 17.  No other SIRS.  BPs 578I systolic.    Hospital Course:  #1 acute pancreatitis Questionable etiology.  Concern could be secondary to hypercalcemia.  Patient with prior history of gallstone pancreatitis status post cholecystectomy.  CT abdomen and pelvis was negative for any biliary dilatation.  LFTs were within normal  limits.  Patient noted to have a leukocytosis likely reactive which have trended down and had resolved by day of discharge.  Patient was aggressively hydrated on admission, placed on bowel rest, pain management, antiemetics.  Patient improved clinically throughout the hospitalization. Lipase levels trended down from 780 on admission down to 83 by day of discharge.  Patient was started on a clear liquid diet once she was pain-free and diet advanced to a soft diet which she tolerated.  IV fluids were decreased appropriately.  Patient will be discharged home in stable and improved condition.  On day of discharge patient was pain-free and tolerating a solid diet.   2.  Hypercalcemia Questionable etiology.  May be secondary to dehydration versus ingestion of Tums.  Per family patient noted to be ingesting loss of Tums recently.  Intact PTH was low at 8.  Total calcium was 10.8.  Calcium levels are trended down with hydration.  Patient will likely need repeat blood work done in the outpatient setting and follow-up with PCP.   3.  Diabetes mellitus type 2 Hemoglobin A1c was 6.7 08/08/2014.  Repeat hemoglobin A1c 6.6.   Patient was maintained on Lantus and sliding scale throughout the hospitalization.  Outpatient follow-up.   4.  Hypertension Cozaar was discontinued on admission secondary to renal insufficiency.  As renal function improved patient was started on Norvasc and dose uptitrated to 10 mg daily for better blood pressure control.  Patient be discharged home on Norvasc 10 mg daily.  Outpatient follow-up with PCP.   5.  Gastro-esophageal reflux disease During the  hospitalization patient with nausea and complaints of heartburn despite being on Protonix 40 mg twice daily.  Patient was placed on the GI cocktail as needed and PPI increased to 60 mg twice daily with resolution of symptoms.  Patient be discharged home on Protonix 60 mg twice daily.  Outpatient follow-up with PCP.    6.  Acute kidney  injury/renal insufficiency Secondary to prerenal azotemia in the setting of ARB.  Patient was hydrated aggressively with IV fluids secondary to acute pancreatitis with resolution of acute kidney injury.  Patient's ARB was discontinued during the hospitalization.  Outpatient follow-up.  7.  Hypokalemia/hypomagnesemia Likely secondary to GI losses.  Repleted.  Outpatient follow-up.    Procedures:  CT abdomen and pelvis 07/18/2017  Abdominal films 07/20/2017      Consultations:  None  Discharge Exam: Vitals:   07/20/17 2034 07/21/17 0509  BP: 140/75 (!) 145/69  Pulse: 85 80  Resp: 16 18  Temp: 98.3 F (36.8 C) 98.4 F (36.9 C)  SpO2: 100% 98%    General: NAD Cardiovascular: RRR Respiratory: CTAB  Discharge Instructions   Discharge Instructions    Diet Carb Modified   Complete by:  As directed    Increase activity slowly   Complete by:  As directed      Allergies as of 07/21/2017   No Known Allergies     Medication List    STOP taking these medications   losartan 50 MG tablet Commonly known as:  COZAAR   omeprazole 20 MG capsule Commonly known as:  PRILOSEC Replaced by:  pantoprazole 20 MG tablet     TAKE these medications   acetaminophen 325 MG tablet Commonly known as:  TYLENOL Take 650 mg by mouth daily as needed (PAIN).   AFRIN ALLERGY NA Place 2 sprays into both nostrils daily as needed (ALLERGIES).   amLODipine 10 MG tablet Commonly known as:  NORVASC Take 1 tablet (10 mg total) by mouth daily. Start taking on:  07/22/2017   celecoxib 200 MG capsule Commonly known as:  CELEBREX Take 200 mg by mouth daily.   gabapentin 100 MG capsule Commonly known as:  NEURONTIN Take 100 mg by mouth 3 (three) times daily.   multivitamin with minerals Tabs tablet Take 1 tablet by mouth daily.   NOVOLOG FLEXPEN 100 UNIT/ML FlexPen Generic drug:  insulin aspart Inject 10 Units into the skin 3 (three) times daily before meals.   pantoprazole 20 MG  tablet Commonly known as:  PROTONIX Take 3 tablets (60 mg total) by mouth 2 (two) times daily before a meal. Replaces:  omeprazole 20 MG capsule   sertraline 100 MG tablet Commonly known as:  ZOLOFT Take 100 mg by mouth daily.   TRESIBA FLEXTOUCH 200 UNIT/ML Sopn Generic drug:  Insulin Degludec Inject 20 Units into the skin every morning.      No Known Allergies Follow-up Information    Burnard Bunting, MD. Schedule an appointment as soon as possible for a visit in 2 week(s).   Specialty:  Internal Medicine Why:  f/u in 2 weeks. Contact information: [address] [place] [address] [phone]            The results of significant diagnostics from this hospitalization (including imaging, microbiology, ancillary and laboratory) are listed below for reference.    Significant Diagnostic Studies: Ct Abdomen Pelvis W Contrast  Result Date: 07/18/2017 CLINICAL DATA:  Generalized abdominal pain with nausea vomiting for several days. EXAM: CT ABDOMEN AND PELVIS WITH CONTRAST TECHNIQUE: Multidetector  CT imaging of the abdomen and pelvis was performed using the standard protocol following bolus administration of intravenous contrast. CONTRAST:  44mL ISOVUE-300 IOPAMIDOL (ISOVUE-300) INJECTION 61% COMPARISON:  Abdominal CT 04/28/2016 FINDINGS: Lower chest: Small hiatal hernia. Hepatobiliary: No focal liver abnormality is seen. Status post cholecystectomy. No biliary dilatation. Pancreas: Diffuse peripancreatic fat stranding and somewhat indistinct architecture of the pancreas. Spleen: Normal in size without focal abnormality. Adrenals/Urinary Tract: Adrenal glands are unremarkable. Kidneys are normal, without renal calculi, focal lesion, or hydronephrosis. Bladder is unremarkable. Stomach/Bowel: Stable postsurgical changes of the stomach. No evidence of small-bowel obstruction or inflammatory changes. Normal appearance of the colon. Vascular/Lymphatic: No significant vascular findings  are present. No enlarged abdominal or pelvic lymph nodes. Reproductive: Status post hysterectomy. No adnexal masses. Other: Fat containing lower anterior abdominal wall hernia. 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 IMPRESSION: Probable acute pancreatitis. No evidence of pseudocyst formation. Please correlate clinically. Small hiatal hernia. Electronically Signed   By: Fidela Salisbury M.D.   On: 07/18/2017 18:55   Dg Abd 2 Views  Result Date: 07/20/2017 CLINICAL DATA:  Generalized abdominal pain, nausea, vomiting EXAM: ABDOMEN - 2 VIEW COMPARISON:  07/18/2017 FINDINGS: Nonobstructive bowel gas pattern. Prior cholecystectomy. No organomegaly or free air. IMPRESSION: Prior cholecystectomy.  No acute findings. Electronically Signed   By: Rolm Baptise M.D.   On: 07/20/2017 13:11    Microbiology: No results found for this or any previous visit (from the past 240 hour(s)).   Labs: Basic Metabolic Panel: Recent Labs  Lab 07/18/17 1023 07/18/17 2130 07/19/17 0650 07/20/17 0639 07/21/17 0549  NA 139  --  142 141 136  K 3.8  --  3.1* 3.7 3.7  CL 91*  --  101 108 107  CO2 29  --  32 28 23  GLUCOSE 367*  --  169* 167* 180*  BUN 39*  --  42* 27* 15  CREATININE 1.33*  --  1.06* 0.79 0.65  CALCIUM 12.2* 10.8* 10.0 8.2* 8.1*  MG  --   --  1.3* 1.9  --    Liver Function Tests: Recent Labs  Lab 07/18/17 1023 07/19/17 0650 07/20/17 0639 07/21/17 0549  AST 30 29 62* 40  ALT 20 17 34 37  ALKPHOS 48 41 54 63  BILITOT 0.9 0.7 0.5 0.5  PROT 7.7 6.8 5.9* 6.1*  ALBUMIN 4.6 3.8 3.3* 3.2*   Recent Labs  Lab 07/18/17 1023 07/19/17 0650 07/20/17 0639 07/21/17 0549  LIPASE 780* 653* 104* 83*   No results for input(s): AMMONIA in the last 168 hours. CBC: Recent Labs  Lab 07/18/17 1023 07/19/17 0650 07/20/17 0639  WBC 17.1* 15.7* 9.0  NEUTROABS 14.5*  --   --   HGB 14.7 13.2 12.0  HCT 44.7 40.7 37.4  MCV 94.9 96.0 97.7  PLT 277 191 171   Cardiac  Enzymes: No results for input(s): CKTOTAL, CKMB, CKMBINDEX, TROPONINI in the last 168 hours. BNP: BNP (last 3 results) No results for input(s): BNP in the last 8760 hours.  ProBNP (last 3 results) No results for input(s): PROBNP in the last 8760 hours.  CBG: Recent Labs  Lab 07/20/17 1125 07/20/17 1704 07/20/17 2032 07/21/17 0732 07/21/17 1128  GLUCAP 199* 145* 221* 175* 172*       Signed:  Irine Seal MD.  [facility] 07/21/2017, 1:44 PM

## 2017-07-21 NOTE — Progress Notes (Signed)
Discharge instructions and medications discussed with patient.  Prescriptions and AVS given to patient. All questions answered.  

## 2017-09-16 DIAGNOSIS — K219 Gastro-esophageal reflux disease without esophagitis: Secondary | ICD-10-CM | POA: Diagnosis not present

## 2017-09-16 DIAGNOSIS — N179 Acute kidney failure, unspecified: Secondary | ICD-10-CM | POA: Diagnosis not present

## 2017-09-16 DIAGNOSIS — Z6841 Body Mass Index (BMI) 40.0 and over, adult: Secondary | ICD-10-CM | POA: Diagnosis not present

## 2017-09-16 DIAGNOSIS — E876 Hypokalemia: Secondary | ICD-10-CM | POA: Diagnosis not present

## 2017-09-16 DIAGNOSIS — K859 Acute pancreatitis without necrosis or infection, unspecified: Secondary | ICD-10-CM | POA: Diagnosis not present

## 2017-09-16 DIAGNOSIS — E119 Type 2 diabetes mellitus without complications: Secondary | ICD-10-CM | POA: Diagnosis not present

## 2017-09-16 DIAGNOSIS — I10 Essential (primary) hypertension: Secondary | ICD-10-CM | POA: Diagnosis not present

## 2017-10-10 DIAGNOSIS — M1990 Unspecified osteoarthritis, unspecified site: Secondary | ICD-10-CM | POA: Diagnosis not present

## 2017-10-10 DIAGNOSIS — R413 Other amnesia: Secondary | ICD-10-CM | POA: Diagnosis not present

## 2017-10-10 DIAGNOSIS — Z6841 Body Mass Index (BMI) 40.0 and over, adult: Secondary | ICD-10-CM | POA: Diagnosis not present

## 2017-10-10 DIAGNOSIS — J3089 Other allergic rhinitis: Secondary | ICD-10-CM | POA: Diagnosis not present

## 2017-10-10 DIAGNOSIS — E1169 Type 2 diabetes mellitus with other specified complication: Secondary | ICD-10-CM | POA: Diagnosis not present

## 2017-10-10 DIAGNOSIS — K219 Gastro-esophageal reflux disease without esophagitis: Secondary | ICD-10-CM | POA: Diagnosis not present

## 2017-10-10 DIAGNOSIS — F418 Other specified anxiety disorders: Secondary | ICD-10-CM | POA: Diagnosis not present

## 2017-10-10 DIAGNOSIS — M5416 Radiculopathy, lumbar region: Secondary | ICD-10-CM | POA: Diagnosis not present

## 2017-10-10 DIAGNOSIS — I10 Essential (primary) hypertension: Secondary | ICD-10-CM | POA: Diagnosis not present

## 2017-10-16 ENCOUNTER — Other Ambulatory Visit: Payer: Self-pay | Admitting: Internal Medicine

## 2017-10-16 DIAGNOSIS — R413 Other amnesia: Secondary | ICD-10-CM

## 2017-10-21 ENCOUNTER — Ambulatory Visit
Admission: RE | Admit: 2017-10-21 | Discharge: 2017-10-21 | Disposition: A | Discharge status: Home / Self Care | Payer: Medicare Other | Source: Ambulatory Visit | Attending: Internal Medicine | Admitting: Internal Medicine

## 2017-10-21 DIAGNOSIS — R413 Other amnesia: Secondary | ICD-10-CM

## 2017-10-21 MED ORDER — GADOBENATE DIMEGLUMINE 529 MG/ML IV SOLN
20.0000 mL | Freq: Once | INTRAVENOUS | Status: AC | PRN
  Administered 2017-10-21: 20 mL via INTRAVENOUS

## 2017-11-12 DIAGNOSIS — H524 Presbyopia: Secondary | ICD-10-CM | POA: Diagnosis not present

## 2017-11-12 DIAGNOSIS — E119 Type 2 diabetes mellitus without complications: Secondary | ICD-10-CM | POA: Diagnosis not present

## 2017-11-12 DIAGNOSIS — H401131 Primary open-angle glaucoma, bilateral, mild stage: Secondary | ICD-10-CM | POA: Diagnosis not present

## 2017-11-12 DIAGNOSIS — H2513 Age-related nuclear cataract, bilateral: Secondary | ICD-10-CM | POA: Diagnosis not present

## 2017-12-02 DIAGNOSIS — I10 Essential (primary) hypertension: Secondary | ICD-10-CM | POA: Diagnosis not present

## 2017-12-02 DIAGNOSIS — E1169 Type 2 diabetes mellitus with other specified complication: Secondary | ICD-10-CM | POA: Diagnosis not present

## 2017-12-02 DIAGNOSIS — R82998 Other abnormal findings in urine: Secondary | ICD-10-CM | POA: Diagnosis not present

## 2017-12-04 DIAGNOSIS — R413 Other amnesia: Secondary | ICD-10-CM | POA: Diagnosis not present

## 2017-12-04 DIAGNOSIS — F418 Other specified anxiety disorders: Secondary | ICD-10-CM | POA: Diagnosis not present

## 2017-12-04 DIAGNOSIS — E7849 Other hyperlipidemia: Secondary | ICD-10-CM | POA: Diagnosis not present

## 2017-12-04 DIAGNOSIS — Z6841 Body Mass Index (BMI) 40.0 and over, adult: Secondary | ICD-10-CM | POA: Diagnosis not present

## 2017-12-04 DIAGNOSIS — I10 Essential (primary) hypertension: Secondary | ICD-10-CM | POA: Diagnosis not present

## 2017-12-04 DIAGNOSIS — Z1389 Encounter for screening for other disorder: Secondary | ICD-10-CM | POA: Diagnosis not present

## 2017-12-04 DIAGNOSIS — M5416 Radiculopathy, lumbar region: Secondary | ICD-10-CM | POA: Diagnosis not present

## 2017-12-04 DIAGNOSIS — J309 Allergic rhinitis, unspecified: Secondary | ICD-10-CM | POA: Diagnosis not present

## 2017-12-04 DIAGNOSIS — Z23 Encounter for immunization: Secondary | ICD-10-CM | POA: Diagnosis not present

## 2017-12-04 DIAGNOSIS — E1169 Type 2 diabetes mellitus with other specified complication: Secondary | ICD-10-CM | POA: Diagnosis not present

## 2017-12-04 DIAGNOSIS — Z0000 Encounter for general adult medical examination without abnormal findings: Secondary | ICD-10-CM | POA: Diagnosis not present

## 2018-02-19 DIAGNOSIS — Z6841 Body Mass Index (BMI) 40.0 and over, adult: Secondary | ICD-10-CM | POA: Diagnosis not present

## 2018-02-19 DIAGNOSIS — K219 Gastro-esophageal reflux disease without esophagitis: Secondary | ICD-10-CM | POA: Diagnosis not present

## 2018-02-19 DIAGNOSIS — A084 Viral intestinal infection, unspecified: Secondary | ICD-10-CM | POA: Diagnosis not present

## 2018-02-19 DIAGNOSIS — R413 Other amnesia: Secondary | ICD-10-CM | POA: Diagnosis not present

## 2018-03-15 DIAGNOSIS — Z23 Encounter for immunization: Secondary | ICD-10-CM | POA: Diagnosis not present

## 2018-04-16 DIAGNOSIS — R05 Cough: Secondary | ICD-10-CM | POA: Diagnosis not present

## 2018-04-16 DIAGNOSIS — K219 Gastro-esophageal reflux disease without esophagitis: Secondary | ICD-10-CM | POA: Diagnosis not present

## 2018-04-16 DIAGNOSIS — M1990 Unspecified osteoarthritis, unspecified site: Secondary | ICD-10-CM | POA: Diagnosis not present

## 2018-04-16 DIAGNOSIS — I10 Essential (primary) hypertension: Secondary | ICD-10-CM | POA: Diagnosis not present

## 2018-04-16 DIAGNOSIS — F418 Other specified anxiety disorders: Secondary | ICD-10-CM | POA: Diagnosis not present

## 2018-04-16 DIAGNOSIS — R509 Fever, unspecified: Secondary | ICD-10-CM | POA: Diagnosis not present

## 2018-04-16 DIAGNOSIS — R413 Other amnesia: Secondary | ICD-10-CM | POA: Diagnosis not present

## 2018-04-16 DIAGNOSIS — M5416 Radiculopathy, lumbar region: Secondary | ICD-10-CM | POA: Diagnosis not present

## 2018-04-16 DIAGNOSIS — J309 Allergic rhinitis, unspecified: Secondary | ICD-10-CM | POA: Diagnosis not present

## 2018-04-16 DIAGNOSIS — E1169 Type 2 diabetes mellitus with other specified complication: Secondary | ICD-10-CM | POA: Diagnosis not present

## 2018-04-16 DIAGNOSIS — R309 Painful micturition, unspecified: Secondary | ICD-10-CM | POA: Diagnosis not present

## 2018-04-20 ENCOUNTER — Inpatient Hospital Stay (HOSPITAL_COMMUNITY)
Admission: EM | Admit: 2018-04-20 | Discharge: 2018-04-25 | DRG: 871 | Disposition: A | Discharge status: Home / Self Care | Payer: Medicare Other | Attending: Internal Medicine | Admitting: Internal Medicine

## 2018-04-20 ENCOUNTER — Other Ambulatory Visit: Payer: Self-pay

## 2018-04-20 ENCOUNTER — Encounter (HOSPITAL_COMMUNITY): Payer: Self-pay | Admitting: *Deleted

## 2018-04-20 ENCOUNTER — Inpatient Hospital Stay: Payer: Self-pay

## 2018-04-20 ENCOUNTER — Emergency Department (HOSPITAL_COMMUNITY): Payer: Medicare Other

## 2018-04-20 DIAGNOSIS — A4151 Sepsis due to Escherichia coli [E. coli]: Principal | ICD-10-CM | POA: Diagnosis present

## 2018-04-20 DIAGNOSIS — M1990 Unspecified osteoarthritis, unspecified site: Secondary | ICD-10-CM | POA: Diagnosis not present

## 2018-04-20 DIAGNOSIS — K219 Gastro-esophageal reflux disease without esophagitis: Secondary | ICD-10-CM | POA: Diagnosis present

## 2018-04-20 DIAGNOSIS — Z820 Family history of epilepsy and other diseases of the nervous system: Secondary | ICD-10-CM

## 2018-04-20 DIAGNOSIS — K449 Diaphragmatic hernia without obstruction or gangrene: Secondary | ICD-10-CM | POA: Diagnosis present

## 2018-04-20 DIAGNOSIS — N289 Disorder of kidney and ureter, unspecified: Secondary | ICD-10-CM | POA: Diagnosis not present

## 2018-04-20 DIAGNOSIS — E871 Hypo-osmolality and hyponatremia: Secondary | ICD-10-CM | POA: Diagnosis present

## 2018-04-20 DIAGNOSIS — F0391 Unspecified dementia with behavioral disturbance: Secondary | ICD-10-CM | POA: Diagnosis present

## 2018-04-20 DIAGNOSIS — G309 Alzheimer's disease, unspecified: Secondary | ICD-10-CM | POA: Diagnosis not present

## 2018-04-20 DIAGNOSIS — Z794 Long term (current) use of insulin: Secondary | ICD-10-CM | POA: Diagnosis not present

## 2018-04-20 DIAGNOSIS — N2889 Other specified disorders of kidney and ureter: Secondary | ICD-10-CM | POA: Diagnosis not present

## 2018-04-20 DIAGNOSIS — R7881 Bacteremia: Secondary | ICD-10-CM | POA: Diagnosis not present

## 2018-04-20 DIAGNOSIS — T68XXXA Hypothermia, initial encounter: Secondary | ICD-10-CM | POA: Diagnosis not present

## 2018-04-20 DIAGNOSIS — N179 Acute kidney failure, unspecified: Secondary | ICD-10-CM | POA: Diagnosis present

## 2018-04-20 DIAGNOSIS — Z87442 Personal history of urinary calculi: Secondary | ICD-10-CM | POA: Diagnosis not present

## 2018-04-20 DIAGNOSIS — Z9049 Acquired absence of other specified parts of digestive tract: Secondary | ICD-10-CM

## 2018-04-20 DIAGNOSIS — R509 Fever, unspecified: Secondary | ICD-10-CM

## 2018-04-20 DIAGNOSIS — E1165 Type 2 diabetes mellitus with hyperglycemia: Secondary | ICD-10-CM | POA: Diagnosis not present

## 2018-04-20 DIAGNOSIS — Z6841 Body Mass Index (BMI) 40.0 and over, adult: Secondary | ICD-10-CM | POA: Diagnosis not present

## 2018-04-20 DIAGNOSIS — Z8719 Personal history of other diseases of the digestive system: Secondary | ICD-10-CM

## 2018-04-20 DIAGNOSIS — J310 Chronic rhinitis: Secondary | ICD-10-CM | POA: Diagnosis not present

## 2018-04-20 DIAGNOSIS — R2681 Unsteadiness on feet: Secondary | ICD-10-CM | POA: Diagnosis not present

## 2018-04-20 DIAGNOSIS — F419 Anxiety disorder, unspecified: Secondary | ICD-10-CM | POA: Diagnosis present

## 2018-04-20 DIAGNOSIS — E6609 Other obesity due to excess calories: Secondary | ICD-10-CM | POA: Diagnosis not present

## 2018-04-20 DIAGNOSIS — R680 Hypothermia, not associated with low environmental temperature: Secondary | ICD-10-CM | POA: Diagnosis present

## 2018-04-20 DIAGNOSIS — E875 Hyperkalemia: Secondary | ICD-10-CM | POA: Diagnosis not present

## 2018-04-20 DIAGNOSIS — E876 Hypokalemia: Secondary | ICD-10-CM | POA: Diagnosis present

## 2018-04-20 DIAGNOSIS — Z85028 Personal history of other malignant neoplasm of stomach: Secondary | ICD-10-CM

## 2018-04-20 DIAGNOSIS — E1110 Type 2 diabetes mellitus with ketoacidosis without coma: Secondary | ICD-10-CM | POA: Diagnosis not present

## 2018-04-20 DIAGNOSIS — I10 Essential (primary) hypertension: Secondary | ICD-10-CM | POA: Diagnosis not present

## 2018-04-20 DIAGNOSIS — F329 Major depressive disorder, single episode, unspecified: Secondary | ICD-10-CM | POA: Diagnosis present

## 2018-04-20 DIAGNOSIS — F0390 Unspecified dementia without behavioral disturbance: Secondary | ICD-10-CM | POA: Diagnosis not present

## 2018-04-20 DIAGNOSIS — R0602 Shortness of breath: Secondary | ICD-10-CM | POA: Diagnosis not present

## 2018-04-20 DIAGNOSIS — F03918 Unspecified dementia, unspecified severity, with other behavioral disturbance: Secondary | ICD-10-CM

## 2018-04-20 DIAGNOSIS — R52 Pain, unspecified: Secondary | ICD-10-CM | POA: Diagnosis not present

## 2018-04-20 DIAGNOSIS — E1310 Other specified diabetes mellitus with ketoacidosis without coma: Secondary | ICD-10-CM

## 2018-04-20 DIAGNOSIS — Z903 Acquired absence of stomach [part of]: Secondary | ICD-10-CM | POA: Diagnosis not present

## 2018-04-20 DIAGNOSIS — M6281 Muscle weakness (generalized): Secondary | ICD-10-CM | POA: Diagnosis not present

## 2018-04-20 DIAGNOSIS — F0280 Dementia in other diseases classified elsewhere without behavioral disturbance: Secondary | ICD-10-CM | POA: Diagnosis not present

## 2018-04-20 DIAGNOSIS — E119 Type 2 diabetes mellitus without complications: Secondary | ICD-10-CM

## 2018-04-20 DIAGNOSIS — R05 Cough: Secondary | ICD-10-CM | POA: Diagnosis not present

## 2018-04-20 DIAGNOSIS — E46 Unspecified protein-calorie malnutrition: Secondary | ICD-10-CM | POA: Diagnosis not present

## 2018-04-20 DIAGNOSIS — R278 Other lack of coordination: Secondary | ICD-10-CM | POA: Diagnosis not present

## 2018-04-20 LAB — URINALYSIS, ROUTINE W REFLEX MICROSCOPIC
Bilirubin Urine: NEGATIVE
Glucose, UA: >=500 mg/dL — AB
Ketones, ur: 80 mg/dL — AB
Leukocytes, UA: NEGATIVE
Nitrite: NEGATIVE
Protein, ur: NEGATIVE mg/dL
Specific Gravity, Urine: 1.016 (ref 1.005–1.030)
pH: 5 (ref 5.0–8.0)

## 2018-04-20 LAB — BASIC METABOLIC PANEL
Anion gap: 24 — ABNORMAL HIGH (ref 5–15)
Anion gap: 19 — ABNORMAL HIGH (ref 5–15)
BUN: 42 mg/dL — ABNORMAL HIGH (ref 8–23)
BUN: 40 mg/dL — ABNORMAL HIGH (ref 8–23)
BUN: 38 mg/dL — ABNORMAL HIGH (ref 8–23)
CO2: <7 mmol/L — ABNORMAL LOW (ref 22–32)
CO2: <7 mmol/L — ABNORMAL LOW (ref 22–32)
CO2: 7 mmol/L — ABNORMAL LOW (ref 22–32)
Calcium: 9.2 mg/dL (ref 8.9–10.3)
Calcium: 9.2 mg/dL (ref 8.9–10.3)
Calcium: 9.1 mg/dL (ref 8.9–10.3)
Chloride: 99 mmol/L (ref 98–111)
Chloride: 110 mmol/L (ref 98–111)
Chloride: 110 mmol/L (ref 98–111)
Creatinine, Ser: 1.7 mg/dL — ABNORMAL HIGH (ref 0.44–1.00)
Creatinine, Ser: 1.38 mg/dL — ABNORMAL HIGH (ref 0.44–1.00)
Creatinine, Ser: 1.28 mg/dL — ABNORMAL HIGH (ref 0.44–1.00)
GFR calc Af Amer: 35 mL/min — ABNORMAL LOW (ref >60)
GFR calc Af Amer: 44 mL/min — ABNORMAL LOW (ref >60)
GFR calc Af Amer: 49 mL/min — ABNORMAL LOW (ref >60)
GFR calc non Af Amer: 30 mL/min — ABNORMAL LOW (ref >60)
GFR calc non Af Amer: 38 mL/min — ABNORMAL LOW (ref >60)
GFR calc non Af Amer: 42 mL/min — ABNORMAL LOW (ref >60)
Glucose, Bld: 549 mg/dL (ref 70–99)
Glucose, Bld: 337 mg/dL — ABNORMAL HIGH (ref 70–99)
Glucose, Bld: 231 mg/dL — ABNORMAL HIGH (ref 70–99)
Potassium: 5.7 mmol/L — ABNORMAL HIGH (ref 3.5–5.1)
Potassium: 4.3 mmol/L (ref 3.5–5.1)
Potassium: 3.7 mmol/L (ref 3.5–5.1)
Sodium: 128 mmol/L — ABNORMAL LOW (ref 135–145)
Sodium: 138 mmol/L (ref 135–145)
Sodium: 136 mmol/L (ref 135–145)

## 2018-04-20 LAB — CBG MONITORING, ED
Glucose-Capillary: 535 mg/dL (ref 70–99)
Glucose-Capillary: 460 mg/dL — ABNORMAL HIGH (ref 70–99)
Glucose-Capillary: 347 mg/dL — ABNORMAL HIGH (ref 70–99)

## 2018-04-20 LAB — CBC WITH DIFFERENTIAL/PLATELET
Abs Immature Granulocytes: 1.52 10*3/uL — ABNORMAL HIGH (ref 0.00–0.07)
Basophils Absolute: 0.1 10*3/uL (ref 0.0–0.1)
Basophils Relative: 1 %
Eosinophils Absolute: 0 10*3/uL (ref 0.0–0.5)
Eosinophils Relative: 0 %
HCT: 42.6 % (ref 36.0–46.0)
Hemoglobin: 13.3 g/dL (ref 12.0–15.0)
Immature Granulocytes: 8 %
Lymphocytes Relative: 5 %
Lymphs Abs: 0.9 10*3/uL (ref 0.7–4.0)
MCH: 30.8 pg (ref 26.0–34.0)
MCHC: 31.2 g/dL (ref 30.0–36.0)
MCV: 98.6 fL (ref 80.0–100.0)
Monocytes Absolute: 0.9 10*3/uL (ref 0.1–1.0)
Monocytes Relative: 5 %
Neutro Abs: 15 10*3/uL — ABNORMAL HIGH (ref 1.7–7.7)
Neutrophils Relative %: 81 %
Platelets: 340 10*3/uL (ref 150–400)
RBC: 4.32 MIL/uL (ref 3.87–5.11)
RDW: 13 % (ref 11.5–15.5)
WBC: 18.5 10*3/uL — ABNORMAL HIGH (ref 4.0–10.5)
nRBC: 0 % (ref 0.0–0.2)

## 2018-04-20 LAB — I-STAT CHEM 8, ED
BUN: 59 mg/dL — ABNORMAL HIGH (ref 8–23)
Calcium, Ion: 1.33 mmol/L (ref 1.15–1.40)
Chloride: 104 mmol/L (ref 98–111)
Creatinine, Ser: 1.2 mg/dL — ABNORMAL HIGH (ref 0.44–1.00)
Glucose, Bld: 589 mg/dL (ref 70–99)
HCT: 44 % (ref 36.0–46.0)
Hemoglobin: 15 g/dL (ref 12.0–15.0)
Potassium: 5.5 mmol/L — ABNORMAL HIGH (ref 3.5–5.1)
Sodium: 129 mmol/L — ABNORMAL LOW (ref 135–145)
TCO2: 6 mmol/L — ABNORMAL LOW (ref 22–32)

## 2018-04-20 LAB — GLUCOSE, CAPILLARY
Glucose-Capillary: 133 mg/dL — ABNORMAL HIGH (ref 70–99)
Glucose-Capillary: 152 mg/dL — ABNORMAL HIGH (ref 70–99)
Glucose-Capillary: 157 mg/dL — ABNORMAL HIGH (ref 70–99)
Glucose-Capillary: 163 mg/dL — ABNORMAL HIGH (ref 70–99)
Glucose-Capillary: 230 mg/dL — ABNORMAL HIGH (ref 70–99)
Glucose-Capillary: 244 mg/dL — ABNORMAL HIGH (ref 70–99)
Glucose-Capillary: 287 mg/dL — ABNORMAL HIGH (ref 70–99)

## 2018-04-20 LAB — BLOOD GAS, VENOUS
O2 Saturation: 77.7 %
Patient temperature: 98.6
pH, Ven: 7.043 — CL (ref 7.250–7.430)
pO2, Ven: 51.8 mmHg — ABNORMAL HIGH (ref 32.0–45.0)

## 2018-04-20 LAB — I-STAT TROPONIN, ED: Troponin i, poc: 0.02 ng/mL (ref 0.00–0.08)

## 2018-04-20 LAB — BETA-HYDROXYBUTYRIC ACID: Beta-Hydroxybutyric Acid: 6.26 mmol/L — ABNORMAL HIGH (ref 0.05–0.27)

## 2018-04-20 LAB — HEMOGLOBIN A1C
Hgb A1c MFr Bld: 10.6 % — ABNORMAL HIGH (ref 4.8–5.6)
Mean Plasma Glucose: 257.52 mg/dL

## 2018-04-20 LAB — I-STAT CG4 LACTIC ACID, ED: Lactic Acid, Venous: 1.93 mmol/L — ABNORMAL HIGH (ref 0.5–1.9)

## 2018-04-20 MED ORDER — SODIUM CHLORIDE 0.9 % IV SOLN
INTRAVENOUS | Status: DC
  Administered 2018-04-20 – 2018-04-23 (×3): via INTRAVENOUS

## 2018-04-20 MED ORDER — MEMANTINE HCL ER 28 MG PO CP24
28.0000 mg | ORAL_CAPSULE | Freq: Every day | ORAL | Status: DC
  Administered 2018-04-20 – 2018-04-25 (×6): 28 mg via ORAL
  Filled 2018-04-20 (×6): qty 1

## 2018-04-20 MED ORDER — INSULIN REGULAR(HUMAN) IN NACL 100-0.9 UT/100ML-% IV SOLN
INTRAVENOUS | Status: DC
  Administered 2018-04-20: 4 [IU]/h via INTRAVENOUS
  Administered 2018-04-21: 1.4 [IU]/h via INTRAVENOUS
  Administered 2018-04-23: 0.7 [IU]/h via INTRAVENOUS
  Filled 2018-04-20 (×3): qty 100

## 2018-04-20 MED ORDER — HEPARIN SODIUM (PORCINE) 5000 UNIT/ML IJ SOLN
5000.0000 [IU] | Freq: Three times a day (TID) | INTRAMUSCULAR | Status: DC
  Administered 2018-04-20 – 2018-04-25 (×14): 5000 [IU] via SUBCUTANEOUS
  Filled 2018-04-20 (×14): qty 1

## 2018-04-20 MED ORDER — DEXTROSE-NACL 5-0.45 % IV SOLN
INTRAVENOUS | Status: DC
  Administered 2018-04-20 – 2018-04-23 (×3): via INTRAVENOUS

## 2018-04-20 MED ORDER — SODIUM CHLORIDE 0.9 % IV BOLUS
1000.0000 mL | Freq: Once | INTRAVENOUS | Status: AC
  Administered 2018-04-20: 1000 mL via INTRAVENOUS

## 2018-04-20 MED ORDER — ORAL CARE MOUTH RINSE
15.0000 mL | Freq: Two times a day (BID) | OROMUCOSAL | Status: DC
  Administered 2018-04-21 – 2018-04-24 (×5): 15 mL via OROMUCOSAL

## 2018-04-20 MED ORDER — PANTOPRAZOLE SODIUM 40 MG PO TBEC
40.0000 mg | DELAYED_RELEASE_TABLET | Freq: Every day | ORAL | Status: DC
  Administered 2018-04-20 – 2018-04-25 (×6): 40 mg via ORAL
  Filled 2018-04-20 (×6): qty 1

## 2018-04-20 MED ORDER — VANCOMYCIN HCL IN DEXTROSE 1-5 GM/200ML-% IV SOLN
1000.0000 mg | Freq: Once | INTRAVENOUS | Status: AC
  Administered 2018-04-20: 1000 mg via INTRAVENOUS
  Filled 2018-04-20: qty 200

## 2018-04-20 MED ORDER — AMLODIPINE BESYLATE 10 MG PO TABS
10.0000 mg | ORAL_TABLET | Freq: Every day | ORAL | Status: DC
  Administered 2018-04-20 – 2018-04-25 (×6): 10 mg via ORAL
  Filled 2018-04-20: qty 2
  Filled 2018-04-20: qty 1
  Filled 2018-04-20 (×3): qty 2
  Filled 2018-04-20: qty 1

## 2018-04-20 MED ORDER — PIPERACILLIN-TAZOBACTAM 3.375 G IVPB
3.3750 g | Freq: Once | INTRAVENOUS | Status: AC
  Administered 2018-04-20: 3.375 g via INTRAVENOUS
  Filled 2018-04-20: qty 50

## 2018-04-20 NOTE — ED Notes (Signed)
Unable to start admission meds due to limited IV sources. Admitting MD aware and has placed order for PICC line

## 2018-04-20 NOTE — ED Triage Notes (Signed)
S/o states pt has had a virus this week, seen at [facility] and tested for flu and UTI which were negative Chest x ray clear.Marland Kitchen Presents today with weakness, SHOB,

## 2018-04-20 NOTE — ED Notes (Signed)
Fluids placed on fluid warmer and bear hugger on pt. This is causing her to fight the blanket trying to remove it.

## 2018-04-20 NOTE — ED Notes (Signed)
Pt has IV in A/C upper arm area, due to agitation fluids will not infuse. IV rt thumb infusing well. Attempted for IV unsuccessfully.

## 2018-04-20 NOTE — ED Provider Notes (Signed)
[facility] DEPT Provider Note   CSN: [id number] Arrival date & time: 04/20/18  1117     History   Chief Complaint Chief Complaint  Patient presents with  . Shortness of Breath  . Weakness    HPI Angela Harvey is a 67 y.o. female.  She presents after about a week of some nonspecific illness symptoms.  She has had some fatigue and has been sleeping a lot.  She had a couple episodes of vomiting.  She was seen by her PCP and they tested for flu as needed and a chest x-ray and no diagnosis was given.  She is continued to decline and has had more vomiting and now some vague abdominal pain.  She is brought in here for further eval today by her husband.  She is a diabetic on insulin she said she is never been in DKA before.  Has not been checking her sugar this week but has continued to use her insulin.  The history is provided by the patient and the spouse.  Illness  This is a new problem. The current episode started more than 1 week ago. The problem has been rapidly worsening. Associated symptoms include abdominal pain. Pertinent negatives include no chest pain, no headaches and no shortness of breath. Nothing aggravates the symptoms. Nothing relieves the symptoms. She has tried rest for the symptoms. The treatment provided no relief.    Past Medical History:  Diagnosis Date  . Anemia   . GERD (gastroesophageal reflux disease)   . Hiatal hernia   . History of acute pancreatitis 07/2014  . History of colon polyps   . 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 Hypertension   . Left ureteral stone   . Type 2 diabetes mellitus treated with insulin [facility])     Patient Active Problem List   Diagnosis Date Noted  . Hypercalcemia 07/18/2017  . Acute pancreatitis 07/18/2017  . Renal insufficiency 07/18/2017  . Ureteral calculus 04/28/2016  . Severe  protein-calorie malnutrition ([place]) 08/15/2014  . Malnutrition of moderate degree ([place]) 08/15/2014  . Essential hypertension 08/14/2014  . Diabetes mellitus, type II ([place]) 08/14/2014  . GERD (gastroesophageal reflux disease) 08/14/2014  . Acute biliary pancreatitis 08/08/2014    Past Surgical History:  Procedure Laterality Date  . CHOLECYSTECTOMY N/A 08/16/2014   Procedure: LAPAROSCOPIC CHOLECYSTECTOMY WITH INTRAOPERATIVE CHOLANGIOGRAM;  Surgeon: Kaylyn Lim, MD;  Location: [facility] ORS;  Service: General;  Laterality: N/A;  . CYSTOSCOPY W/ URETERAL STENT PLACEMENT Left 04/28/2016   Procedure: CYSTOSCOPY WITH RETROGRADE PYELOGRAM/URETERAL STENT PLACEMENT;  Surgeon: Kathie Rhodes, MD;  Location: [facility];  Service: Urology;  Laterality: Left;  . CYSTOSCOPY WITH RETROGRADE PYELOGRAM, URETEROSCOPY AND STENT PLACEMENT Left 06/15/2016   Procedure: CYSTOSCOPY STENT REMOVAL  LEFT RETROGRADE PYELOGRAM, URETEROSCOPY, STONE BASKETRY;  Surgeon: Kathie Rhodes, MD;  Location: [facility];  Service: Urology;  Laterality: Left;  . LAPAROSCOPIC PARTIAL GASTRECTOMY  1988   malignant tumor  . UMBILICAL HERNIA REPAIR  1970's  . VAGINAL HYSTERECTOMY  1980's     OB History   None      Home Medications    Prior to Admission medications   Medication Sig Start Date End Date Taking? Authorizing Provider  acetaminophen (TYLENOL) 325 MG tablet Take 650 mg by mouth daily as needed (PAIN).    [provider]  amLODipine (NORVASC) 10 MG tablet  Take 1 tablet (10 mg total) by mouth daily. 07/22/17   Eugenie Filler, MD  celecoxib (CELEBREX) 200 MG capsule Take 200 mg by mouth daily. 02/16/16   [provider]  gabapentin (NEURONTIN) 100 MG capsule Take 100 mg by mouth 3 (three) times daily. 02/10/16   [provider]  Multiple Vitamin (MULTIVITAMIN WITH MINERALS) TABS tablet Take 1 tablet by mouth daily.    [provider]  NOVOLOG FLEXPEN 100 UNIT/ML FlexPen Inject 10 Units  into the skin 3 (three) times daily before meals.  04/15/16   [provider]  pantoprazole (PROTONIX) 20 MG tablet Take 3 tablets (60 mg total) by mouth 2 (two) times daily before a meal. 07/21/17   Eugenie Filler, MD  Phenylephrine HCl (AFRIN ALLERGY NA) Place 2 sprays into both nostrils daily as needed (ALLERGIES).    [provider]  sertraline (ZOLOFT) 100 MG tablet Take 100 mg by mouth daily. 06/02/17   [provider]  TRESIBA FLEXTOUCH 200 UNIT/ML SOPN Inject 20 Units into the skin every morning.  02/18/16   [provider]    Family History Family History  Problem Relation Age of Onset  . Renal cancer Neg Hx   . Renal Disease Neg Hx     Social History Social History   Tobacco Use  . Smoking status: Never Smoker  . Smokeless tobacco: Never Used  Substance Use Topics  . Alcohol use: No  . Drug use: No     Allergies   Patient has no known allergies.   Review of Systems Review of Systems  Constitutional: Positive for activity change, chills, fatigue and fever.  HENT: Negative for sore throat.   Eyes: Negative for visual disturbance.  Respiratory: Negative for shortness of breath.   Cardiovascular: Negative for chest pain.  Gastrointestinal: Positive for abdominal pain, nausea and vomiting. Negative for diarrhea.  Endocrine: Positive for polydipsia.  Genitourinary: Negative for dysuria and frequency.  Musculoskeletal: Negative for neck pain.  Skin: Negative for rash.  Neurological: Negative for headaches.     Physical Exam Updated Vital Signs BP (!) 196/83   Pulse 81   Temp 97.7 F (36.5 C) (Oral)   Resp (!) 23   Ht 5\' 3"  (1.6 m)   Wt 111.1 kg   SpO2 100%   BMI 43.40 kg/m   Physical Exam  Constitutional: She appears well-developed and well-nourished. She appears ill.  HENT:  Head: Normocephalic and atraumatic.  Breath smells like acetone.  Eyes: Conjunctivae are normal.  Neck: Neck supple.  Cardiovascular:  Normal rate and regular rhythm.  No murmur heard. Pulmonary/Chest: Effort normal and breath sounds normal. Tachypnea noted. No respiratory distress.  Abdominal: Soft. She exhibits no mass. There is no tenderness.  Musculoskeletal: She exhibits no edema.       Right lower leg: Normal. She exhibits no tenderness.       Left lower leg: Normal. She exhibits no tenderness.  Neurological: She is alert.  Skin: Skin is warm and dry. Capillary refill takes less than 2 seconds.  Psychiatric: She has a normal mood and affect.  Nursing note and vitals reviewed.    ED Treatments / Results  Labs (all labs ordered are listed, but only abnormal results are displayed) Labs Reviewed  BASIC METABOLIC PANEL - Abnormal; Notable for the following components:      Result Value   Sodium 128 (*)    Potassium 5.7 (*)    CO2 <7 (*)    Glucose,  Bld 549 (*)    BUN 42 (*)    Creatinine, Ser 1.70 (*)    GFR calc non Af Amer 30 (*)    GFR calc Af Amer 35 (*)    Anion gap 24 (*)    All other components within normal limits  BLOOD GAS, VENOUS - Abnormal; Notable for the following components:   pH, Ven 7.043 (*)    pO2, Ven 51.8 (*)    All other components within normal limits  CBC WITH DIFFERENTIAL/PLATELET - Abnormal; Notable for the following components:   WBC 18.5 (*)    Neutro Abs 15.0 (*)    Abs Immature Granulocytes 1.52 (*)    All other components within normal limits  URINALYSIS, ROUTINE W REFLEX MICROSCOPIC - Abnormal; Notable for the following components:   APPearance HAZY (*)    Glucose, UA >=500 (*)    Hgb urine dipstick MODERATE (*)    Ketones, ur 80 (*)    Bacteria, UA RARE (*)    All other components within normal limits  CBG MONITORING, ED - Abnormal; Notable for the following components:   Glucose-Capillary 535 (*)    All other components within normal limits  I-STAT CHEM 8, ED - Abnormal; Notable for the following components:   Sodium 129 (*)    Potassium 5.5 (*)    BUN 59 (*)     Creatinine, Ser 1.20 (*)    Glucose, Bld 589 (*)    TCO2 6 (*)    All other components within normal limits  I-STAT CG4 LACTIC ACID, ED - Abnormal; Notable for the following components:   Lactic Acid, Venous 1.93 (*)    All other components within normal limits  CBG MONITORING, ED - Abnormal; Notable for the following components:   Glucose-Capillary 460 (*)    All other components within normal limits  CBG MONITORING, ED - Abnormal; Notable for the following components:   Glucose-Capillary 347 (*)    All other components within normal limits  CULTURE, BLOOD (ROUTINE X 2)  CULTURE, BLOOD (ROUTINE X 2)  BETA-HYDROXYBUTYRIC ACID  BASIC METABOLIC PANEL  BASIC METABOLIC PANEL  BASIC METABOLIC PANEL  BASIC METABOLIC PANEL  HEMOGLOBIN A1C  LACTIC ACID, PLASMA  CBC  HIV ANTIBODY (ROUTINE TESTING W REFLEX)  I-STAT TROPONIN, ED  I-STAT CG4 LACTIC ACID, ED    EKG EKG Interpretation  Date/Time:  Sunday April 20 2018 11:43:33 EST Ventricular Rate:  76 PR Interval:    QRS Duration: 94 QT Interval:  413 QTC Calculation: 465 R Axis:   18 Text Interpretation:  Sinus rhythm Low voltage, precordial leads Nonspecific T abnormalities, lateral leads poor baseline similar pattern to prior 1/18 Confirmed by Aletta Edouard [phone]) on 04/20/2018 12:27:45 PM Also confirmed by Aletta Edouard [phone]), editor Philomena Doheny [phone])  on 04/20/2018 2:31:35 PM   Radiology Dg Chest Portable 1 View  Result Date: 04/20/2018 CLINICAL DATA:  Weakness.  Shortness of breath. EXAM: PORTABLE CHEST 1 VIEW COMPARISON:  Chest radiograph 04/28/2016. FINDINGS: Monitoring leads overlie the patient. Normal cardiac scratch the stable cardiac and mediastinal contours. No consolidative pulmonary opacities. IMPRESSION: No acute cardiopulmonary process. Electronically Signed   By: Lovey Newcomer M.D.   On: 04/20/2018 13:00   [place] Ekg Site Rite  Result Date: 04/20/2018 If Site Rite image not attached, placement could not  be confirmed due to current cardiac rhythm.   Procedures .Critical Care Performed by: Hayden Rasmussen, MD Authorized by: Hayden Rasmussen, MD   Critical care  provider statement:    Critical care time (minutes):  45   Critical care time was exclusive of:  Separately billable procedures and treating other patients   Critical care was necessary to treat or prevent imminent or life-threatening deterioration of the following conditions:  Metabolic crisis   Critical care was time spent personally by me on the following activities:  Discussions with consultants, evaluation of patient's response to treatment, examination of patient, ordering and performing treatments and interventions, ordering and review of laboratory studies, ordering and review of radiographic studies, pulse oximetry, re-evaluation of patient's condition, obtaining history from patient or surrogate, review of old charts and development of treatment plan with patient or surrogate   I assumed direction of critical care for this patient from another provider in my specialty: no     (including critical care time)  Medications Ordered in ED Medications  insulin regular, human (MYXREDLIN) 100 units/ 100 mL infusion (2.9 Units/hr Intravenous Rate/Dose Change 04/20/18 1530)  dextrose 5 %-0.45 % sodium chloride infusion (has no administration in time range)  sodium chloride 0.9 % bolus 1,000 mL (1,000 mLs Intravenous Transfusing/Transfer 04/20/18 1531)    And  0.9 %  sodium chloride infusion ( Intravenous New Bag/Given 04/20/18 1340)  vancomycin (VANCOCIN) IVPB 1000 mg/200 mL premix (has no administration in time range)  piperacillin-tazobactam (ZOSYN) IVPB 3.375 g (has no administration in time range)  amLODipine (NORVASC) tablet 10 mg (has no administration in time range)  memantine (NAMENDA XR) 24 hr capsule 28 mg (has no administration in time range)  pantoprazole (PROTONIX) EC tablet 40 mg (has no administration in time range)    heparin injection 5,000 Units (has no administration in time range)  sodium chloride 0.9 % bolus 1,000 mL (1,000 mLs Intravenous Transfusing/Transfer 04/20/18 1531)     Initial Impression / Assessment and Plan / ED Course  I have reviewed the triage vital signs and the nursing notes.  Pertinent labs & imaging results that were available during my care of the patient were reviewed by me and considered in my medical decision making (see chart for details).  Clinical Course as of Apr 20 1330  Nancy Fetter Apr 20, 2018  1242 Patient clinically in DKA.  Her lab work is starting to come back and she has a pH of around 7.0 and large gap.  We have ordered IV fluids and were not started on an insulin drip as her potassium is 5.5.   [MB]  1303 Updated the patient and her spouse on the results.  She understands that she will need to be admitted to the hospital for continued management of this.   [MB]  67 Discussed with [facility] hospital[facility] Dr. Tawanna Solo who will accept the patient for admission.   [MB]    Clinical Course User Index [MB] Hayden Rasmussen, MD      Final Clinical Impressions(s) / ED Diagnoses   Final diagnoses:  Diabetic ketoacidosis without coma associated with other specified diabetes mellitus ([place])  Hypothermia, initial encounter    ED Discharge Orders    None       Hayden Rasmussen, MD 04/20/18 1735

## 2018-04-20 NOTE — ED Notes (Signed)
Attempted report, nurse unable to accept report, she will call ED back in 20 min

## 2018-04-20 NOTE — ED Notes (Signed)
Pt becoming more confused in conversation and agitated.Husband at bedside.

## 2018-04-20 NOTE — H&P (Signed)
History and Physical    Angela Harvey OZD:[id number] DOB: [birth year] DOA: 04/20/2018  PCP: Burnard Bunting, MD   Patient coming from: Home  Chief Complaint: Generalized weakness  HPI: Angela Harvey is a 67 y.o. female with medical history significant of diabetes type 2, hypertension, dementia who presents to the emergency department from home with complaints of weakness, shortness of breath.  She has been sick for last 1 week.  Initially she started having chills on last Sunday.  She then developed nausea and low-grade fever.  She was seen by her PCP few days ago who ordered a chest x-ray, did some blood work, urinalysis and flu test and all of them were okay.  It was thought to be some viral infection. There was no improvement in her overall condition.  She was feeling more weak, also new  shortness of breath.  She started throwing up and also complained of abdominal pain.  On Wednesday it was noted that she had a temperature of 100.2.  Her husband then brought her to the emergency department today. When patient presented to the emergency department, she was found to be hypothermic.  Lab works confirm DKA. Patient seen and examined the bedside in the emergency department.  Besides hypothermia, her vitals are currently stable.  She denies any chest pain, abdominal pain, dysuria, diarrhea, hematochezia, melena or headache. She is alert but not oriented.  He is usually confused on her baseline due to her early dementia  ED Course: Started on DKA protocol.  Chest x-ray was clear.  Noted to be hypothermic .  Also given a dose of vancomycin and Zosyn empirically.     Past Medical History:  Diagnosis Date  . Anemia   . GERD (gastroesophageal reflux disease)   . Hiatal hernia   . History of acute pancreatitis 07/2014  . History of colon polyps   . 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  Hypertension   . Left ureteral stone   . Type 2 diabetes mellitus treated with insulin [facility])     Past Surgical History:  Procedure Laterality Date  . CHOLECYSTECTOMY N/A 08/16/2014   Procedure: LAPAROSCOPIC CHOLECYSTECTOMY WITH INTRAOPERATIVE CHOLANGIOGRAM;  Surgeon: Kaylyn Lim, MD;  Location: [facility];  Service: General;  Laterality: N/A;  . CYSTOSCOPY W/ URETERAL STENT PLACEMENT Left 04/28/2016   Procedure: CYSTOSCOPY WITH RETROGRADE PYELOGRAM/URETERAL STENT PLACEMENT;  Surgeon: Kathie Rhodes, MD;  Location: [facility];  Service: Urology;  Laterality: Left;  . CYSTOSCOPY WITH RETROGRADE PYELOGRAM, URETEROSCOPY AND STENT PLACEMENT Left 06/15/2016   Procedure: CYSTOSCOPY STENT REMOVAL  LEFT RETROGRADE PYELOGRAM, URETEROSCOPY, STONE BASKETRY;  Surgeon: Kathie Rhodes, MD;  Location: [place];  Service: Urology;  Laterality: Left;  . LAPAROSCOPIC PARTIAL GASTRECTOMY  1988   malignant tumor  . UMBILICAL HERNIA REPAIR  1970's  . VAGINAL HYSTERECTOMY  1980's     reports that she has never smoked. She has never used smokeless tobacco. She reports that she does not drink alcohol or use drugs.  No Known Allergies  Family History  Problem Relation Age of Onset  . Renal cancer Neg Hx   . Renal Disease Neg Hx      Prior to Admission medications   Medication Sig Start Date End Date Taking? Authorizing Provider  acetaminophen (TYLENOL) 325 MG tablet Take 650 mg by mouth daily as needed (PAIN).   Yes [provider]  amLODipine (NORVASC) 10 MG tablet Take 1 tablet (10 mg total) by mouth daily. 07/22/17  Yes Eugenie Filler, MD  celecoxib (CELEBREX) 200 MG capsule Take 200 mg by mouth daily. 02/16/16  Yes [provider]  losartan (COZAAR) 50 MG tablet Take 50 mg by mouth daily. 02/17/18  Yes [provider]  memantine (NAMENDA XR) 28 MG CP24 24 hr capsule Take 28 mg by mouth daily. 03/18/18  Yes [provider]  Multiple Vitamin (MULTIVITAMIN WITH  MINERALS) TABS tablet Take 1 tablet by mouth daily.   Yes [provider]  NOVOLOG FLEXPEN 100 UNIT/ML FlexPen Inject 10 Units into the skin 3 (three) times daily before meals.  04/15/16  Yes [provider]  pantoprazole (PROTONIX) 20 MG tablet Take 3 tablets (60 mg total) by mouth 2 (two) times daily before a meal. 07/21/17  Yes Eugenie Filler, MD  Phenylephrine HCl (AFRIN ALLERGY NA) Place 2 sprays into both nostrils daily as needed (ALLERGIES).   Yes [provider]  sertraline (ZOLOFT) 100 MG tablet Take 100 mg by mouth daily. 06/02/17  Yes [provider]  TRESIBA FLEXTOUCH 200 UNIT/ML SOPN Inject 20 Units into the skin every morning.  02/18/16  Yes [provider]    Physical Exam: Vitals:   04/20/18 1139 04/20/18 1223 04/20/18 1251 04/20/18 1417  BP:  (!) 162/80    Pulse:  74    Resp:  (!) 23    Temp:   (!) 94.4 F (34.7 C) (!) 95.8 F (35.4 C)  TempSrc:   Rectal Rectal  SpO2:  92%    Weight: 111.1 kg     Height: 5\' 3"  (1.6 m)       Constitutional: obese, generalized weakness Vitals:   04/20/18 1139 04/20/18 1223 04/20/18 1251 04/20/18 1417  BP:  (!) 162/80    Pulse:  74    Resp:  (!) 23    Temp:   (!) 94.4 F (34.7 C) (!) 95.8 F (35.4 C)  TempSrc:   Rectal Rectal  SpO2:  92%    Weight: 111.1 kg     Height: 5\' 3"  (1.6 m)      Eyes: PERRL, lids and conjunctivae normal ENMT: Mucous membranes are dry. Posterior pharynx clear of any exudate or lesions.Normal dentition.  Neck: normal, supple, no masses, no thyromegaly Respiratory: clear to auscultation bilaterally, no wheezing, no crackles. Normal respiratory effort. No accessory muscle use.  Cardiovascular: Regular rate and rhythm, no murmurs / rubs / gallops. No extremity edema. 2+ pedal pulses. No carotid bruits.  Abdomen: no tenderness, no masses palpated. No hepatosplenomegaly. Bowel sounds positive.  Musculoskeletal: no clubbing / cyanosis. No joint deformity upper and  lower extremities. Good ROM, no contractures. Normal muscle tone.  Skin: no rashes, lesions, ulcers. No induration Neurologic: CN 2-12 grossly intact. Sensation intact, DTR normal. Strength 5/5 in all 4.  Psychiatric: Alert but not oriented  Foley Catheter:None  Labs on Admission: I have personally reviewed following labs and imaging studies  CBC: Recent Labs  Lab 04/20/18 1220 04/20/18 1229  WBC 18.5*  --   NEUTROABS 15.0*  --   HGB 13.3 15.0  HCT 42.6 44.0  MCV 98.6  --   PLT 340  --    Basic Metabolic Panel: Recent Labs  Lab 04/20/18 1220 04/20/18 1229  NA 128* 129*  K 5.7* 5.5*  CL 99 104  CO2 <7*  --   GLUCOSE 549* 589*  BUN 42* 59*  CREATININE 1.70* 1.20*  CALCIUM 9.2  --    GFR: Estimated Creatinine Clearance: 53.8 mL/min (A) (by C-G formula based on SCr of 1.2 mg/dL (H)). Liver Function Tests: No results for input(s): AST, ALT, ALKPHOS, BILITOT, PROT, ALBUMIN in the last 168 hours. No results for input(s): LIPASE, AMYLASE in the last 168 hours. No results for input(s): AMMONIA in the last 168 hours. Coagulation Profile: No results for input(s): INR, PROTIME in the last 168 hours. Cardiac Enzymes: No results for input(s): CKTOTAL, CKMB, CKMBINDEX, TROPONINI in the last 168 hours. BNP (last 3 results) No results for input(s): PROBNP in the last 8760 hours. HbA1C: No results for input(s): HGBA1C in the last 72 hours. CBG: Recent Labs  Lab 04/20/18 1153 04/20/18 1409  GLUCAP 535* 460*   Lipid Profile: No results for input(s): CHOL, HDL, LDLCALC, TRIG, CHOLHDL, LDLDIRECT in the last 72 hours. Thyroid Function Tests: No results for input(s): TSH, T4TOTAL, FREET4, T3FREE, THYROIDAB in the last 72 hours. Anemia Panel: No results for input(s): VITAMINB12, FOLATE, FERRITIN, TIBC, IRON, RETICCTPCT in the last 72 hours. Urine analysis:    Component Value Date/Time   COLORURINE YELLOW 04/20/2018 1220   APPEARANCEUR HAZY (A) 04/20/2018 1220   LABSPEC 1.016  04/20/2018 1220   PHURINE 5.0 04/20/2018 1220   GLUCOSEU >=500 (A) 04/20/2018 1220   HGBUR MODERATE (A) 04/20/2018 1220   BILIRUBINUR NEGATIVE 04/20/2018 1220   KETONESUR 80 (A) 04/20/2018 1220   PROTEINUR NEGATIVE 04/20/2018 1220   UROBILINOGEN 0.2 08/08/2014 1505   NITRITE NEGATIVE 04/20/2018 1220   LEUKOCYTESUR NEGATIVE 04/20/2018 1220    Radiological Exams on Admission: Dg Chest Portable 1 View  Result Date: 04/20/2018 CLINICAL DATA:  Weakness.  Shortness of breath. EXAM: PORTABLE CHEST 1 VIEW COMPARISON:  Chest radiograph 04/28/2016. FINDINGS: Monitoring leads overlie the patient. Normal cardiac scratch the stable cardiac and mediastinal contours. No consolidative pulmonary opacities. IMPRESSION: No acute cardiopulmonary process. Electronically Signed   By: Lovey Newcomer M.D.   On: 04/20/2018 13:00     Assessment/Plan Principal Problem:   DKA (diabetic ketoacidoses) ([place]) Active Problems:   Essential hypertension   Diabetes mellitus, type II ([place])   Renal insufficiency   Hyponatremia   Hyperkalemia   Dementia with behavioral disturbance (HCC)   Hypothermia   DKA, type 2 ([place])  DKA: Most likely precipitated by occult  infectious etiology, could be viral.  Started on DKA protocol.  Monitor her in stepdown. She has history of diabetes mellitus and is on insulin at home which she is compliant with as per the husband.  Hypothermia/suspected sepsis: Found to be hypothermic on presentation.  No clear source of sepsis identified.  Chest x-ray is clear.  UA is not impressive for UTI.  Abdomen is soft and nontender.  No ulcers/wounds on the body.  Also has leukocytosis.  She will be given a dose of vancomycin and Zosyn.  Slightly elevated lactic acid level.  Continue IV fluids.  Cultures sent.  Acute kidney injury: Secondary to dehydration from decreased oral intake and increased urination because of hypoglycemia.  Continue IV fluids.  Lactic acidosis: Mild.  Continue IV fluids.   Will check level tomorrow.  Hypertension: On amlodipine and losartan at home.  Will hold losartan due to AKI.  Hyponatremia: Likely hyperosmolar hyponatremia due to hyperglycemia.  We will continue to monitor.  Hyperkalemia: We will anticipate improvement with insulin drip.  Dementia: Early dementia.  Has extensive history of Alzheimer's disease in the family.  She is confused on her  baseline.  More confused here on presentation secondary to metabolic encephalopathy.  Continue to monitor mental status.  Continue Namenda.   Severity of Illness: The appropriate patient status for this patient is INPATIENT.  DVT prophylaxis: Heparin [place] Code Status: Full Family Communication: Husband present at the bedside Consults called: None     Shelly Coss MD [facility] Pager [phone]  If 7PM-7AM, please contact night-coverage [email or web address] Password [email or web address]  04/20/2018, 2:26 PM

## 2018-04-20 NOTE — ED Notes (Signed)
Pt refused foley cath . 

## 2018-04-20 NOTE — ED Notes (Signed)
Date and time results received: 04/20/18 1304 (use smartphrase ".now" to insert current time)  Test: Glucose Critical Value: 549  Name of Provider Notified: Steele Sizer  Orders Received? Or Actions Taken?: Actions Taken: Notified EDP and Primary RN

## 2018-04-20 NOTE — ED Notes (Signed)
ED TO INPATIENT HANDOFF REPORT  Name/Age/Gender Angela Harvey 67 y.o. female  Code Status    Code Status Orders  (From admission, onward)         Start     Ordered   04/20/18 1427  Full code  Continuous     04/20/18 1426        Code Status History    Date Active Date Inactive Code Status Order ID Comments User Context   07/18/2017 1936 07/21/2017 1703 Full Code [id number]  Etta Quill, DO ED   04/28/2016 2042 04/30/2016 1318 Full Code [id number]  Kathie Rhodes, MD Inpatient   08/16/2014 1210 08/17/2014 2042 Full Code [id number]  Johnathan Hausen, MD Inpatient   08/08/2014 2005 08/16/2014 1210 Full Code [id number]  Tisovec, Fransico Him, MD Inpatient      Home/SNF/Other Home  Chief Complaint abd pain  Level of Care/Admitting Diagnosis ED Disposition    ED Disposition Condition [place] [facility]: [facility] [[id number]]  Level of Care: Stepdown [14]  Admit to SDU based on following criteria: Hemodynamic compromise or significant risk of instability:  Patient requiring short term acute titration and management of vasoactive drips, and invasive monitoring (i.e., CVP and Arterial line).  Diagnosis: DKA, type 2 [facility]) [[id number]]  Admitting Physician: Shelly Coss [[id number]]  Attending Physician: Shelly Coss [[id number]]  Estimated length of stay: past midnight tomorrow  Certification:: I certify this patient will need inpatient services for at least 2 midnights  PT Class (Do Not Modify): Inpatient [101]  PT Acc Code (Do Not Modify): Private [1]       Medical History Past Medical History:  Diagnosis Date  . Anemia   . GERD (gastroesophageal reflux disease)   . Hiatal hernia   . History of acute pancreatitis 07/2014  . History of colon polyps   . 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 Hypertension   . Left ureteral stone   . Type 2  diabetes mellitus treated with insulin ([place])     Allergies No Known Allergies  IV Location/Drains/Wounds Patient Lines/Drains/Airways Status   Active Line/Drains/Airways    Name:   Placement date:   Placement time:   Site:   Days:   Peripheral IV 04/20/18 Right Hand   04/20/18    1158    Hand   less than 1   Peripheral IV 04/20/18 Right;Upper Arm   04/20/18    1224    Arm   less than 1   Incision (Closed) 08/16/14 Abdomen Other (Comment)   08/16/14    1015     1343   Incision (Closed) 04/28/16 Other (Comment) Left   04/28/16    2110     722   Incision (Closed) 06/15/16 Perineum Other (Comment)   06/15/16    0919     674   Incision - 4 Ports Abdomen Umbilicus Mid;Upper Right;Medial Right;Lateral   08/16/14    1010     1343   Wound / Incision (Open or Dehisced) 04/28/16 Other (Comment) Leg   04/28/16    2227    Leg   722          Labs/Imaging Results for orders placed or performed during the hospital encounter of 04/20/18 (from the past 48 hour(s))  CBG monitoring, ED     Status: Abnormal   Collection Time: 04/20/18 11:53  AM  Result Value Ref Range   Glucose-Capillary 535 (HH) 70 - 99 mg/dL   Comment 1 Notify RN   Blood gas, venous     Status: Abnormal   Collection Time: 04/20/18 12:16 PM  Result Value Ref Range   pH, Ven 7.043 (LL) 7.250 - 7.430    Comment: CRITICAL RESULT CALLED TO, READ BACK BY AND VERIFIED WITH:   pCO2, Ven  44.0 - 60.0 mmHg    CRITICAL RESULT CALLED TO, READ BACK BY AND VERIFIED WITH:    Comment: BELOW REPORTABLE RANGE MD BUTLER AT 1227 BY DEE WALTERS RRT ON 04/20/2018    pO2, Ven 51.8 (H) 32.0 - 45.0 mmHg   Bicarbonate  20.0 - 28.0 mmol/L    CRITICAL RESULT CALLED TO, READ BACK BY AND VERIFIED WITH:    Comment: UNABLE TO DETERMINE (PCO2V BELOW REPORTABLE RANGE)   O2 Saturation 77.7 %   Patient temperature 98.6    Sample type VEIN     Comment: Performed at [facility], [place] [address] [place], [place] [address]  I-stat troponin,  ED     Status: None   Collection Time: 04/20/18 12:20 PM  Result Value Ref Range   Troponin i, poc 0.02 0.00 - 0.08 ng/mL   Comment 3            Comment: Due to the release kinetics of cTnI, a negative result within the first hours of the onset of symptoms does not rule out myocardial infarction with certainty. If myocardial infarction is still suspected, repeat the test at appropriate intervals.   Basic metabolic panel     Status: Abnormal   Collection Time: 04/20/18 12:20 PM  Result Value Ref Range   Sodium 128 (L) 135 - 145 mmol/L   Potassium 5.7 (H) 3.5 - 5.1 mmol/L   Chloride 99 98 - 111 mmol/L   CO2 <7 (L) 22 - 32 mmol/L    Comment: REPEATED TO VERIFY   Glucose, Bld 549 (HH) 70 - 99 mg/dL    Comment: CRITICAL RESULT CALLED TO, READ BACK BY AND VERIFIED WITH: CLAPP,S RN 1304 [id number] [place],N    BUN 42 (H) 8 - 23 mg/dL   Creatinine, Ser 1.70 (H) 0.44 - 1.00 mg/dL   Calcium 9.2 8.9 - 10.3 mg/dL   GFR calc non Af Amer 30 (L) >60 mL/min   GFR calc Af Amer 35 (L) >60 mL/min    Comment: (NOTE) The eGFR has been calculated using the CKD EPI equation. This calculation has not been validated in all clinical situations. eGFR's persistently <60 mL/min signify possible Chronic Kidney Disease.    Anion gap 24 (H) 5 - 15    Comment: Performed at [facility], [place] [address], [place], [place] [address]  CBC with Differential (PNL)     Status: Abnormal   Collection Time: 04/20/18 12:20 PM  Result Value Ref Range   WBC 18.5 (H) 4.0 - 10.5 K/uL   RBC 4.32 3.87 - 5.11 MIL/uL   Hemoglobin 13.3 12.0 - 15.0 g/dL   HCT 42.6 36.0 - 46.0 %   MCV 98.6 80.0 - 100.0 fL   MCH 30.8 26.0 - 34.0 pg   MCHC 31.2 30.0 - 36.0 g/dL   RDW 13.0 11.5 - 15.5 %   Platelets 340 150 - 400 K/uL   nRBC 0.0 0.0 - 0.2 %   Neutrophils Relative % 81 %   Neutro Abs 15.0 (H) 1.7 - 7.7 K/uL   Lymphocytes  Relative 5 %   Lymphs Abs 0.9 0.7 - 4.0 K/uL   Monocytes Relative 5 %   Monocytes  Absolute 0.9 0.1 - 1.0 K/uL   Eosinophils Relative 0 %   Eosinophils Absolute 0.0 0.0 - 0.5 K/uL   Basophils Relative 1 %   Basophils Absolute 0.1 0.0 - 0.1 K/uL   Smear Review MORPHOLOGY UNREMARKABLE    Immature Granulocytes 8 %   Abs Immature Granulocytes 1.52 (H) 0.00 - 0.07 K/uL    Comment: Performed at [facility], [place] [address], [place], [place] [address]  Urinalysis, Routine w reflex microscopic     Status: Abnormal   Collection Time: 04/20/18 12:20 PM  Result Value Ref Range   Color, Urine YELLOW YELLOW   APPearance HAZY (A) CLEAR   Specific Gravity, Urine 1.016 1.005 - 1.030   pH 5.0 5.0 - 8.0   Glucose, UA >=500 (A) NEGATIVE mg/dL   Hgb urine dipstick MODERATE (A) NEGATIVE   Bilirubin Urine NEGATIVE NEGATIVE   Ketones, ur 80 (A) NEGATIVE mg/dL   Protein, ur NEGATIVE NEGATIVE mg/dL   Nitrite NEGATIVE NEGATIVE   Leukocytes, UA NEGATIVE NEGATIVE   RBC / HPF 0-5 0 - 5 RBC/hpf   WBC, UA 6-10 0 - 5 WBC/hpf   Bacteria, UA RARE (A) NONE SEEN   Squamous Epithelial / LPF 0-5 0 - 5   Mucus PRESENT    Hyaline Casts, UA PRESENT     Comment: Performed at [facility], [place] [address] [place], [place] [address]  I-Stat Chem 8, ED     Status: Abnormal   Collection Time: 04/20/18 12:29 PM  Result Value Ref Range   Sodium 129 (L) 135 - 145 mmol/L   Potassium 5.5 (H) 3.5 - 5.1 mmol/L   Chloride 104 98 - 111 mmol/L   BUN 59 (H) 8 - 23 mg/dL   Creatinine, Ser 1.20 (H) 0.44 - 1.00 mg/dL   Glucose, Bld 589 (HH) 70 - 99 mg/dL   Calcium, Ion 1.33 1.15 - 1.40 mmol/L   TCO2 6 (L) 22 - 32 mmol/L   Hemoglobin 15.0 12.0 - 15.0 g/dL   HCT 44.0 36.0 - 46.0 %   Comment NOTIFIED PHYSICIAN   I-Stat CG4 Lactic Acid, ED     Status: Abnormal   Collection Time: 04/20/18  1:56 PM  Result Value Ref Range   Lactic Acid, Venous 1.93 (H) 0.5 - 1.9 mmol/L  CBG monitoring, ED     Status: Abnormal   Collection Time: 04/20/18  2:09 PM  Result Value Ref Range    Glucose-Capillary 460 (H) 70 - 99 mg/dL   Dg Chest Portable 1 View  Result Date: 04/20/2018 CLINICAL DATA:  Weakness.  Shortness of breath. EXAM: PORTABLE CHEST 1 VIEW COMPARISON:  Chest radiograph 04/28/2016. FINDINGS: Monitoring leads overlie the patient. Normal cardiac scratch the stable cardiac and mediastinal contours. No consolidative pulmonary opacities. IMPRESSION: No acute cardiopulmonary process. Electronically Signed   By: Lovey Newcomer M.D.   On: 04/20/2018 13:00   [place] Ekg Site Rite  Result Date: 04/20/2018 If Site Rite image not attached, placement could not be confirmed due to current cardiac rhythm.  EKG Interpretation  Date/Time:  Sunday April 20 2018 11:43:33 EST Ventricular Rate:  76 PR Interval:    QRS Duration: 94 QT Interval:  413 QTC Calculation: 465 R Axis:   18 Text Interpretation:  Sinus rhythm Low voltage, precordial leads Nonspecific T abnormalities, lateral leads poor baseline similar pattern to  prior 1/18 Confirmed by Aletta Edouard [phone]) on 04/20/2018 12:27:45 PM Also confirmed by Aletta Edouard [phone]), editor Philomena Doheny [phone])  on 04/20/2018 2:31:35 PM   Pending Labs Unresulted Labs (From admission, onward)    Start     Ordered   04/21/18 0500  Lactic acid, plasma  Tomorrow morning,   R     04/20/18 1424   04/21/18 0500  CBC  Tomorrow morning,   R     04/20/18 1426   04/20/18 1426  HIV antibody (Routine Testing)  Once,   R     04/20/18 1426   04/20/18 1425  Hemoglobin A1c  Add-on,   R     04/20/18 1424   04/20/18 8022  Basic metabolic panel  Now then every 4 hours,   R     04/20/18 1424   04/20/18 1305  Culture, blood (routine x 2)  BLOOD CULTURE X 2,   STAT     04/20/18 1304   04/20/18 1222  Beta-hydroxybutyric acid  Add-on,   R     04/20/18 1221          Vitals/Pain Today's Vitals   04/20/18 1330 04/20/18 1400 04/20/18 1417 04/20/18 1443  BP: (!) 184/68 (!) 187/85  (!) 154/60  Pulse:    81  Resp: (!) 23 (!) 22  (!) 24   Temp:   (!) 95.8 F (35.4 C) (!) 96.5 F (35.8 C)  TempSrc:   Rectal Rectal  SpO2:    100%  Weight:      Height:      PainSc:    0-No pain    Isolation Precautions No active isolations  Medications Medications  insulin regular, human (MYXREDLIN) 100 units/ 100 mL infusion (4 Units/hr Intravenous New Bag/Given 04/20/18 1419)  dextrose 5 %-0.45 % sodium chloride infusion (has no administration in time range)  sodium chloride 0.9 % bolus 1,000 mL (1,000 mLs Intravenous New Bag/Given 04/20/18 1349)    And  0.9 %  sodium chloride infusion ( Intravenous New Bag/Given 04/20/18 1340)  vancomycin (VANCOCIN) IVPB 1000 mg/200 mL premix (has no administration in time range)  piperacillin-tazobactam (ZOSYN) IVPB 3.375 g (has no administration in time range)  amLODipine (NORVASC) tablet 10 mg (has no administration in time range)  memantine (NAMENDA XR) 24 hr capsule 28 mg (has no administration in time range)  pantoprazole (PROTONIX) EC tablet 40 mg (has no administration in time range)  heparin injection 5,000 Units (has no administration in time range)  sodium chloride 0.9 % bolus 1,000 mL (1,000 mLs Intravenous New Bag/Given 04/20/18 1225)    Mobility walks

## 2018-04-20 NOTE — Progress Notes (Signed)
Spoke with Ir[place] Rn re PICC order.  Irfa states PICC was requested due to RUA PIV not working well.  Dressing changed, flushed without difficulty.  Irfa RN states she will d/c PICC order, no longer needed.

## 2018-04-20 NOTE — ED Notes (Signed)
Pt becoming more confused. She will not keep warming blanket on, reomved after she kept tearing it, warm blankets placed on her

## 2018-04-21 DIAGNOSIS — E871 Hypo-osmolality and hyponatremia: Secondary | ICD-10-CM

## 2018-04-21 DIAGNOSIS — N179 Acute kidney failure, unspecified: Secondary | ICD-10-CM

## 2018-04-21 DIAGNOSIS — E875 Hyperkalemia: Secondary | ICD-10-CM

## 2018-04-21 DIAGNOSIS — E1165 Type 2 diabetes mellitus with hyperglycemia: Secondary | ICD-10-CM

## 2018-04-21 LAB — GLUCOSE, CAPILLARY
Glucose-Capillary: 138 mg/dL — ABNORMAL HIGH (ref 70–99)
Glucose-Capillary: 170 mg/dL — ABNORMAL HIGH (ref 70–99)
Glucose-Capillary: 187 mg/dL — ABNORMAL HIGH (ref 70–99)
Glucose-Capillary: 163 mg/dL — ABNORMAL HIGH (ref 70–99)
Glucose-Capillary: 158 mg/dL — ABNORMAL HIGH (ref 70–99)
Glucose-Capillary: 148 mg/dL — ABNORMAL HIGH (ref 70–99)
Glucose-Capillary: 183 mg/dL — ABNORMAL HIGH (ref 70–99)
Glucose-Capillary: 157 mg/dL — ABNORMAL HIGH (ref 70–99)
Glucose-Capillary: 158 mg/dL — ABNORMAL HIGH (ref 70–99)
Glucose-Capillary: 164 mg/dL — ABNORMAL HIGH (ref 70–99)
Glucose-Capillary: 166 mg/dL — ABNORMAL HIGH (ref 70–99)
Glucose-Capillary: 168 mg/dL — ABNORMAL HIGH (ref 70–99)
Glucose-Capillary: 179 mg/dL — ABNORMAL HIGH (ref 70–99)
Glucose-Capillary: 132 mg/dL — ABNORMAL HIGH (ref 70–99)
Glucose-Capillary: 150 mg/dL — ABNORMAL HIGH (ref 70–99)
Glucose-Capillary: 187 mg/dL — ABNORMAL HIGH (ref 70–99)
Glucose-Capillary: 174 mg/dL — ABNORMAL HIGH (ref 70–99)
Glucose-Capillary: 165 mg/dL — ABNORMAL HIGH (ref 70–99)
Glucose-Capillary: 131 mg/dL — ABNORMAL HIGH (ref 70–99)

## 2018-04-21 LAB — BASIC METABOLIC PANEL
Anion gap: 15 (ref 5–15)
Anion gap: 11 (ref 5–15)
Anion gap: 9 (ref 5–15)
Anion gap: 7 (ref 5–15)
BUN: 34 mg/dL — ABNORMAL HIGH (ref 8–23)
BUN: 31 mg/dL — ABNORMAL HIGH (ref 8–23)
BUN: 23 mg/dL (ref 8–23)
BUN: 18 mg/dL (ref 8–23)
CO2: 10 mmol/L — ABNORMAL LOW (ref 22–32)
CO2: 13 mmol/L — ABNORMAL LOW (ref 22–32)
CO2: 15 mmol/L — ABNORMAL LOW (ref 22–32)
CO2: 17 mmol/L — ABNORMAL LOW (ref 22–32)
Calcium: 8.8 mg/dL — ABNORMAL LOW (ref 8.9–10.3)
Calcium: 8.9 mg/dL (ref 8.9–10.3)
Calcium: 9.2 mg/dL (ref 8.9–10.3)
Calcium: 9.3 mg/dL (ref 8.9–10.3)
Chloride: 110 mmol/L (ref 98–111)
Chloride: 111 mmol/L (ref 98–111)
Chloride: 112 mmol/L — ABNORMAL HIGH (ref 98–111)
Chloride: 112 mmol/L — ABNORMAL HIGH (ref 98–111)
Creatinine, Ser: 1.15 mg/dL — ABNORMAL HIGH (ref 0.44–1.00)
Creatinine, Ser: 1.03 mg/dL — ABNORMAL HIGH (ref 0.44–1.00)
Creatinine, Ser: 0.92 mg/dL (ref 0.44–1.00)
Creatinine, Ser: 0.84 mg/dL (ref 0.44–1.00)
GFR calc Af Amer: 55 mL/min — ABNORMAL LOW (ref >60)
GFR calc Af Amer: >60 mL/min (ref >60)
GFR calc Af Amer: >60 mL/min (ref >60)
GFR calc Af Amer: >60 mL/min (ref >60)
GFR calc non Af Amer: 48 mL/min — ABNORMAL LOW (ref >60)
GFR calc non Af Amer: 55 mL/min — ABNORMAL LOW (ref >60)
GFR calc non Af Amer: >60 mL/min (ref >60)
GFR calc non Af Amer: >60 mL/min (ref >60)
Glucose, Bld: 181 mg/dL — ABNORMAL HIGH (ref 70–99)
Glucose, Bld: 146 mg/dL — ABNORMAL HIGH (ref 70–99)
Glucose, Bld: 169 mg/dL — ABNORMAL HIGH (ref 70–99)
Glucose, Bld: 163 mg/dL — ABNORMAL HIGH (ref 70–99)
Potassium: 3.4 mmol/L — ABNORMAL LOW (ref 3.5–5.1)
Potassium: 3.2 mmol/L — ABNORMAL LOW (ref 3.5–5.1)
Potassium: 2.6 mmol/L — CL (ref 3.5–5.1)
Potassium: 3.7 mmol/L (ref 3.5–5.1)
Sodium: 135 mmol/L (ref 135–145)
Sodium: 135 mmol/L (ref 135–145)
Sodium: 136 mmol/L (ref 135–145)
Sodium: 136 mmol/L (ref 135–145)

## 2018-04-21 LAB — HIV ANTIBODY (ROUTINE TESTING W REFLEX): HIV Screen 4th Generation wRfx: NONREACTIVE

## 2018-04-21 LAB — BLOOD CULTURE ID PANEL (REFLEXED)

## 2018-04-21 LAB — MRSA PCR SCREENING: MRSA by PCR: NEGATIVE

## 2018-04-21 LAB — CBC
HCT: 39.4 % (ref 36.0–46.0)
Hemoglobin: 13 g/dL (ref 12.0–15.0)
MCH: 30.6 pg (ref 26.0–34.0)
MCHC: 33 g/dL (ref 30.0–36.0)
MCV: 92.7 fL (ref 80.0–100.0)
Platelets: 225 10*3/uL (ref 150–400)
RBC: 4.25 MIL/uL (ref 3.87–5.11)
RDW: 12.7 % (ref 11.5–15.5)
WBC: 11.9 10*3/uL — ABNORMAL HIGH (ref 4.0–10.5)
nRBC: 0 % (ref 0.0–0.2)

## 2018-04-21 LAB — LACTIC ACID, PLASMA: Lactic Acid, Venous: 1.9 mmol/L (ref 0.5–1.9)

## 2018-04-21 MED ORDER — POTASSIUM CHLORIDE 10 MEQ/100ML IV SOLN
10.0000 meq | INTRAVENOUS | Status: AC
  Administered 2018-04-21 – 2018-04-22 (×5): 10 meq via INTRAVENOUS
  Filled 2018-04-21 (×4): qty 100

## 2018-04-21 MED ORDER — POTASSIUM CHLORIDE CRYS ER 20 MEQ PO TBCR
40.0000 meq | EXTENDED_RELEASE_TABLET | Freq: Two times a day (BID) | ORAL | Status: DC
  Administered 2018-04-21: 40 meq via ORAL
  Filled 2018-04-21: qty 2

## 2018-04-21 MED ORDER — POTASSIUM CHLORIDE CRYS ER 20 MEQ PO TBCR
40.0000 meq | EXTENDED_RELEASE_TABLET | Freq: Three times a day (TID) | ORAL | Status: AC
  Administered 2018-04-21 – 2018-04-22 (×3): 40 meq via ORAL
  Filled 2018-04-21 (×3): qty 2

## 2018-04-21 MED ORDER — GUAIFENESIN-DM 100-10 MG/5ML PO SYRP
5.0000 mL | ORAL_SOLUTION | ORAL | Status: DC | PRN
  Administered 2018-04-21 – 2018-04-23 (×2): 5 mL via ORAL
  Filled 2018-04-21 (×2): qty 10

## 2018-04-21 MED ORDER — HYDRALAZINE HCL 20 MG/ML IJ SOLN: 5.0000 mg | INTRAMUSCULAR | Status: DC | PRN

## 2018-04-21 MED ORDER — HALOPERIDOL LACTATE 5 MG/ML IJ SOLN
1.0000 mg | Freq: Four times a day (QID) | INTRAMUSCULAR | Status: DC | PRN
  Administered 2018-04-21: 1 mg via INTRAVENOUS
  Filled 2018-04-21: qty 1

## 2018-04-21 MED ORDER — SODIUM CHLORIDE 0.9 % IV SOLN
2.0000 g | INTRAVENOUS | Status: DC
  Administered 2018-04-21: 2 g via INTRAVENOUS
  Filled 2018-04-21: qty 2
  Filled 2018-04-21: qty 20

## 2018-04-21 NOTE — Progress Notes (Signed)
PHARMACY - PHYSICIAN COMMUNICATION CRITICAL VALUE ALERT - BLOOD CULTURE IDENTIFICATION (BCID)  Angela Harvey is an 67 y.o. female who presented to [facility] on 04/20/2018 with a chief complaint of weakness, SOB, vomiting, abdominal pain, fever/hypothermia.  Admitted with DKA.  Assessment:  Suspected sepsis on admission, no clear source  Name of physician (or Provider) Contacted: Dr Aileen Fass  Current antibiotics: None, only give Vanc/Zosyn x1 dose   Changes to prescribed antibiotics recommended:  Recommendations accepted by provider  Ceftriaxone 2g IV q24h  Results for orders placed or performed during the hospital encounter of 04/20/18  Blood Culture ID Panel (Reflexed) (Collected: 04/20/2018  1:10 PM)  Result Value Ref Range   Enterococcus species NOT DETECTED NOT DETECTED   Listeria monocytogenes NOT DETECTED NOT DETECTED   Staphylococcus species NOT DETECTED NOT DETECTED   Staphylococcus aureus (BCID) NOT DETECTED NOT DETECTED   Streptococcus species NOT DETECTED NOT DETECTED   Streptococcus agalactiae NOT DETECTED NOT DETECTED   Streptococcus pneumoniae NOT DETECTED NOT DETECTED   Streptococcus pyogenes NOT DETECTED NOT DETECTED   Acinetobacter baumannii NOT DETECTED NOT DETECTED   Enterobacteriaceae species DETECTED (A) NOT DETECTED   Enterobacter cloacae complex NOT DETECTED NOT DETECTED   Escherichia coli DETECTED (A) NOT DETECTED   Klebsiella oxytoca NOT DETECTED NOT DETECTED   Klebsiella pneumoniae NOT DETECTED NOT DETECTED   Proteus species NOT DETECTED NOT DETECTED   Serratia marcescens NOT DETECTED NOT DETECTED   Carbapenem resistance NOT DETECTED NOT DETECTED   Haemophilus influenzae NOT DETECTED NOT DETECTED   Neisseria meningitidis NOT DETECTED NOT DETECTED   Pseudomonas aeruginosa NOT DETECTED NOT DETECTED   Candida albicans NOT DETECTED NOT DETECTED   Candida glabrata NOT DETECTED NOT DETECTED   Candida krusei NOT DETECTED NOT DETECTED   Candida  parapsilosis NOT DETECTED NOT DETECTED   Candida tropicalis NOT DETECTED NOT DETECTED    Gretta Arab PharmD, BCPS Pager [phone] 04/21/2018 7:25 AM

## 2018-04-21 NOTE — Progress Notes (Signed)
[facility] PROGRESS NOTE    Progress Note  Angela Harvey  ZOX:[id number] DOB: 12-Jun-1950 DOA: 04/20/2018 PCP: Burnard Bunting, MD     Brief Narrative:   Angela Harvey is an 67 y.o. female past medical history of diabetes mellitus type 2 dementia presents to the emergency department complaining of weakness and shortness of breath that started 1 day prior to admission saw her PCP her chest x-ray were ordered and she was treated with supportive care for viral infection the ED she was found to be hypothermic in DKA  Assessment/Plan:   DKA (diabetic ketoacidoses)/  Diabetes mellitus, type II ([place]): Started on the insulin drip, blood glucose is improving nicely. Bicarb is still less than 10 has a persistent anion gap. Continue IV insulin drip and D5W.  Sepsis due to E. coli bacteremia: Chest x-ray was clear UA does not show signs of infection. She was started empirically on admission and IV vancomycin and Zosyn due to a slightly elevated lactic acid and leukocytosis. BCI came positive for E. coli De-escalate antibiotics to IV Rocephin. He has remained afebrile, vitals are stable.  leukocytosis is improved.  Acute kidney injury: Likely prerenal azotemia in the setting of polyuria (due to hyperglycemia). Creatinine is improving with IV fluid hydration. Currently her baseline creatinine ~ 1.0.  AKI: Likely due to DKA, resolved.  Essential hypertension Losartan has been held due to acute renal failure. Pressure slightly elevated will use hydralazine IV PRN.  Hyperosmolar hyponatremia: Likely due to hyperglycemia is improving nicely.  Hyperkalemia/Hypokalemic: Likely due to acidosis, she is now hypokalemic will replete orally.  Dementia with behavioral disturbance [facility]): Continue current home medications.  DVT prophylaxis: lovenox Family Communication:none Disposition Plan/Barrier to D/C: unable to determine Code Status:     Code Status Orders  (From admission,  onward)         Start     Ordered   04/20/18 1427  Full code  Continuous     04/20/18 1426        Code Status History    Date Active Date Inactive Code Status Order ID Comments User Context   07/18/2017 1936 07/21/2017 1703 Full Code [id number]  Etta Quill, DO ED   04/28/2016 2042 04/30/2016 1318 Full Code [id number]  Kathie Rhodes, MD Inpatient   08/16/2014 1210 08/17/2014 2042 Full Code [id number]  Johnathan Hausen, MD Inpatient   08/08/2014 2005 08/16/2014 1210 Full Code [id number]  Tisovec, Fransico Him, MD Inpatient        IV Access:    Peripheral IV   Procedures and diagnostic studies:   Dg Chest Portable 1 View  Result Date: 04/20/2018 CLINICAL DATA:  Weakness.  Shortness of breath. EXAM: PORTABLE CHEST 1 VIEW COMPARISON:  Chest radiograph 04/28/2016. FINDINGS: Monitoring leads overlie the patient. Normal cardiac scratch the stable cardiac and mediastinal contours. No consolidative pulmonary opacities. IMPRESSION: No acute cardiopulmonary process. Electronically Signed   By: Lovey Newcomer M.D.   On: 04/20/2018 13:00   [place] Ekg Site Rite  Result Date: 04/20/2018 If Site Rite image not attached, placement could not be confirmed due to current cardiac rhythm.    Medical Consultants:    None.  Anti-Infectives:   IV Rocephin  Subjective:    [place] she relates she has no new complaints she is anorexic.  Objective:    Vitals:   04/20/18 2311 04/21/18 0000 04/21/18 0300 04/21/18 0319  BP:  (!) 161/52 (!) 137/43   Pulse:  72 77   Resp:  Marland Kitchen)  22 20   Temp: 97.9 F (36.6 C)   98.4 F (36.9 C)  TempSrc: Oral   Oral  SpO2:  98% 99%   Weight:      Height:        Intake/Output Summary (Last 24 hours) at 04/21/2018 0718 Last data filed at 04/21/2018 0428 Gross per 24 hour  Intake 2698.09 ml  Output -  Net 2698.09 ml   Filed Weights   04/20/18 1139  Weight: 111.1 kg    Exam: General exam: In no acute distress. Respiratory system: Good air movement  and clear to auscultation. Cardiovascular system: S1 & S2 heard, RRR.  Gastrointestinal system: Abdomen is nondistended, soft and nontender.  Central nervous system: Alert and oriented. No focal neurological deficits. Extremities: No pedal edema. Skin: No rashes, lesions or ulcers   Data Reviewed:    Labs: Basic Metabolic Panel: Recent Labs  Lab 04/20/18 1220 04/20/18 1229 04/20/18 1424 04/20/18 1830 04/20/18 2308 04/21/18 0322  NA 128* 129* 138 136 135 135  K 5.7* 5.5* 4.3 3.7 3.4* 3.2*  CL 99 104 110 110 110 111  CO2 <7*  --  <7* 7* 10* 13*  GLUCOSE 549* 589* 337* 231* 181* 146*  BUN 42* 59* 40* 38* 34* 31*  CREATININE 1.70* 1.20* 1.38* 1.28* 1.15* 1.03*  CALCIUM 9.2  --  9.2 9.1 8.8* 8.9   GFR Estimated Creatinine Clearance: 62.6 mL/min (A) (by C-G formula based on SCr of 1.03 mg/dL (H)). Liver Function Tests: No results for input(s): AST, ALT, ALKPHOS, BILITOT, PROT, ALBUMIN in the last 168 hours. No results for input(s): LIPASE, AMYLASE in the last 168 hours. No results for input(s): AMMONIA in the last 168 hours. Coagulation profile No results for input(s): INR, PROTIME in the last 168 hours.  CBC: Recent Labs  Lab 04/20/18 1220 04/20/18 1229 04/21/18 0322  WBC 18.5*  --  11.9*  NEUTROABS 15.0*  --   --   HGB 13.3 15.0 13.0  HCT 42.6 44.0 39.4  MCV 98.6  --  92.7  PLT 340  --  225   Cardiac Enzymes: No results for input(s): CKTOTAL, CKMB, CKMBINDEX, TROPONINI in the last 168 hours. BNP (last 3 results) No results for input(s): PROBNP in the last 8760 hours. CBG: Recent Labs  Lab 04/21/18 0040 04/21/18 0155 04/21/18 0258 04/21/18 0507 04/21/18 0612  GLUCAP 187* 170* 138* 163* 158*   D-Dimer: No results for input(s): DDIMER in the last 72 hours. Hgb A1c: Recent Labs    04/20/18 1824  HGBA1C 10.6*   Lipid Profile: No results for input(s): CHOL, HDL, LDLCALC, TRIG, CHOLHDL, LDLDIRECT in the last 72 hours. Thyroid function studies: No  results for input(s): TSH, T4TOTAL, T3FREE, THYROIDAB in the last 72 hours.  Invalid input(s): FREET3 Anemia work up: No results for input(s): VITAMINB12, FOLATE, FERRITIN, TIBC, IRON, RETICCTPCT in the last 72 hours. Sepsis Labs: Recent Labs  Lab 04/20/18 1220 04/20/18 1356 04/21/18 0322  WBC 18.5*  --  11.9*  LATICACIDVEN  --  1.93* 1.9   Microbiology Recent Results (from the past 240 hour(s))  Culture, blood (routine x 2)     Status: None (Preliminary result)   Collection Time: 04/20/18  1:10 PM  Result Value Ref Range Status   Specimen Description   Final    BLOOD LEFT FOREARM Performed at [facility] [address], [place], [place] [address]    Special Requests   Final    BOTTLES DRAWN AEROBIC AND ANAEROBIC  Blood Culture adequate volume Performed at [facility] [address], [place], [place] [address]    Culture  Setup Time   Final    GRAM NEGATIVE RODS AEROBIC BOTTLE ONLY CRITICAL RESULT CALLED TO, READ BACK BY AND VERIFIED WITH: Clifton Custard [id number] FCP Performed at [facility], [place] [address], [place], [place] [address]    Culture GRAM NEGATIVE RODS  Final   Report Status PENDING  Incomplete  Blood Culture ID Panel (Reflexed)     Status: Abnormal   Collection Time: 04/20/18  1:10 PM  Result Value Ref Range Status   Enterococcus species NOT DETECTED NOT DETECTED Final   Listeria monocytogenes NOT DETECTED NOT DETECTED Final   Staphylococcus species NOT DETECTED NOT DETECTED Final   Staphylococcus aureus (BCID) NOT DETECTED NOT DETECTED Final   Streptococcus species NOT DETECTED NOT DETECTED Final   Streptococcus agalactiae NOT DETECTED NOT DETECTED Final   Streptococcus pneumoniae NOT DETECTED NOT DETECTED Final   Streptococcus pyogenes NOT DETECTED NOT DETECTED Final   Acinetobacter baumannii NOT DETECTED NOT DETECTED Final   Enterobacteriaceae species DETECTED (A) NOT DETECTED Final    Comment:  Enterobacteriaceae represent a large family of gram-negative bacteria, not a single organism. CRITICAL RESULT CALLED TO, READ BACK BY AND VERIFIED WITH: PHARMD MEREDITH R [id number] FCP    Enterobacter cloacae complex NOT DETECTED NOT DETECTED Final   Escherichia coli DETECTED (A) NOT DETECTED Final    Comment: CRITICAL RESULT CALLED TO, READ BACK BY AND VERIFIED WITH: PHARMD MEREDITH R [id number] FCP    Klebsiella oxytoca NOT DETECTED NOT DETECTED Final   Klebsiella pneumoniae NOT DETECTED NOT DETECTED Final   Proteus species NOT DETECTED NOT DETECTED Final   Serratia marcescens NOT DETECTED NOT DETECTED Final   Carbapenem resistance NOT DETECTED NOT DETECTED Final   Haemophilus influenzae NOT DETECTED NOT DETECTED Final   Neisseria meningitidis NOT DETECTED NOT DETECTED Final   Pseudomonas aeruginosa NOT DETECTED NOT DETECTED Final   Candida albicans NOT DETECTED NOT DETECTED Final   Candida glabrata NOT DETECTED NOT DETECTED Final   Candida krusei NOT DETECTED NOT DETECTED Final   Candida parapsilosis NOT DETECTED NOT DETECTED Final   Candida tropicalis NOT DETECTED NOT DETECTED Final    Comment: Performed at [facility], [place] [address] [place], [place] [address]  MRSA PCR Screening     Status: None   Collection Time: 04/21/18 12:44 AM  Result Value Ref Range Status   MRSA by PCR NEGATIVE NEGATIVE Final    Comment:        The GeneXpert MRSA Assay (FDA approved for NASAL specimens only), is one component of a comprehensive MRSA colonization surveillance program. It is not intended to diagnose MRSA infection nor to guide or monitor treatment for MRSA infections. Performed at [facility], [place] [address] [place],  [address]      Medications:   . amLODipine  10 mg Oral Daily  . heparin  5,000 Units Subcutaneous Q8H  . mouth rinse  15 mL Mouth Rinse BID  . memantine  28 mg Oral Daily  . pantoprazole  40 mg Oral Daily   Continuous  Infusions: . sodium chloride Stopped (04/20/18 2014)  . dextrose 5 % and 0.45% NaCl 75 mL/hr at 04/21/18 0428  . insulin 1.6 mL/hr at 04/21/18 0428      LOS: 1 day   [name]  [facility]   *Please refer to  [email or web address], password [email or web address] to get updated schedule on who will round on this patient, as hospitalists switch teams weekly. If 7PM-7AM, please contact night-coverage at [email or web address], password [email or web address] for any overnight needs.  04/21/2018, 7:18 AM

## 2018-04-22 ENCOUNTER — Inpatient Hospital Stay (HOSPITAL_COMMUNITY): Payer: Medicare Other

## 2018-04-22 LAB — BASIC METABOLIC PANEL
Anion gap: 11 (ref 5–15)
Anion gap: 7 (ref 5–15)
Anion gap: 7 (ref 5–15)
Anion gap: 7 (ref 5–15)
Anion gap: 8 (ref 5–15)
BUN: 17 mg/dL (ref 8–23)
BUN: 14 mg/dL (ref 8–23)
BUN: 14 mg/dL (ref 8–23)
BUN: 13 mg/dL (ref 8–23)
BUN: 11 mg/dL (ref 8–23)
CO2: 13 mmol/L — ABNORMAL LOW (ref 22–32)
CO2: 15 mmol/L — ABNORMAL LOW (ref 22–32)
CO2: 16 mmol/L — ABNORMAL LOW (ref 22–32)
CO2: 15 mmol/L — ABNORMAL LOW (ref 22–32)
CO2: 15 mmol/L — ABNORMAL LOW (ref 22–32)
Calcium: 9.3 mg/dL (ref 8.9–10.3)
Calcium: 9.1 mg/dL (ref 8.9–10.3)
Calcium: 9 mg/dL (ref 8.9–10.3)
Calcium: 9 mg/dL (ref 8.9–10.3)
Calcium: 9 mg/dL (ref 8.9–10.3)
Chloride: 111 mmol/L (ref 98–111)
Chloride: 116 mmol/L — ABNORMAL HIGH (ref 98–111)
Chloride: 114 mmol/L — ABNORMAL HIGH (ref 98–111)
Chloride: 114 mmol/L — ABNORMAL HIGH (ref 98–111)
Chloride: 112 mmol/L — ABNORMAL HIGH (ref 98–111)
Creatinine, Ser: 0.99 mg/dL (ref 0.44–1.00)
Creatinine, Ser: 0.77 mg/dL (ref 0.44–1.00)
Creatinine, Ser: 0.82 mg/dL (ref 0.44–1.00)
Creatinine, Ser: 0.77 mg/dL (ref 0.44–1.00)
Creatinine, Ser: 0.76 mg/dL (ref 0.44–1.00)
GFR calc Af Amer: >60 mL/min (ref >60)
GFR calc Af Amer: >60 mL/min (ref >60)
GFR calc Af Amer: >60 mL/min (ref >60)
GFR calc Af Amer: >60 mL/min (ref >60)
GFR calc Af Amer: >60 mL/min (ref >60)
GFR calc non Af Amer: 57 mL/min — ABNORMAL LOW (ref >60)
GFR calc non Af Amer: >60 mL/min (ref >60)
GFR calc non Af Amer: >60 mL/min (ref >60)
GFR calc non Af Amer: >60 mL/min (ref >60)
GFR calc non Af Amer: >60 mL/min (ref >60)
Glucose, Bld: 190 mg/dL — ABNORMAL HIGH (ref 70–99)
Glucose, Bld: 100 mg/dL — ABNORMAL HIGH (ref 70–99)
Glucose, Bld: 124 mg/dL — ABNORMAL HIGH (ref 70–99)
Glucose, Bld: 184 mg/dL — ABNORMAL HIGH (ref 70–99)
Glucose, Bld: 159 mg/dL — ABNORMAL HIGH (ref 70–99)
Potassium: 4.1 mmol/L (ref 3.5–5.1)
Potassium: 4.1 mmol/L (ref 3.5–5.1)
Potassium: 3.7 mmol/L (ref 3.5–5.1)
Potassium: 3.8 mmol/L (ref 3.5–5.1)
Potassium: 4.2 mmol/L (ref 3.5–5.1)
Sodium: 135 mmol/L (ref 135–145)
Sodium: 138 mmol/L (ref 135–145)
Sodium: 137 mmol/L (ref 135–145)
Sodium: 136 mmol/L (ref 135–145)
Sodium: 135 mmol/L (ref 135–145)

## 2018-04-22 LAB — GLUCOSE, CAPILLARY
Glucose-Capillary: 122 mg/dL — ABNORMAL HIGH (ref 70–99)
Glucose-Capillary: 124 mg/dL — ABNORMAL HIGH (ref 70–99)
Glucose-Capillary: 131 mg/dL — ABNORMAL HIGH (ref 70–99)
Glucose-Capillary: 131 mg/dL — ABNORMAL HIGH (ref 70–99)
Glucose-Capillary: 140 mg/dL — ABNORMAL HIGH (ref 70–99)
Glucose-Capillary: 141 mg/dL — ABNORMAL HIGH (ref 70–99)
Glucose-Capillary: 142 mg/dL — ABNORMAL HIGH (ref 70–99)
Glucose-Capillary: 149 mg/dL — ABNORMAL HIGH (ref 70–99)
Glucose-Capillary: 150 mg/dL — ABNORMAL HIGH (ref 70–99)
Glucose-Capillary: 157 mg/dL — ABNORMAL HIGH (ref 70–99)
Glucose-Capillary: 159 mg/dL — ABNORMAL HIGH (ref 70–99)
Glucose-Capillary: 168 mg/dL — ABNORMAL HIGH (ref 70–99)
Glucose-Capillary: 169 mg/dL — ABNORMAL HIGH (ref 70–99)
Glucose-Capillary: 175 mg/dL — ABNORMAL HIGH (ref 70–99)
Glucose-Capillary: 177 mg/dL — ABNORMAL HIGH (ref 70–99)
Glucose-Capillary: 184 mg/dL — ABNORMAL HIGH (ref 70–99)
Glucose-Capillary: 184 mg/dL — ABNORMAL HIGH (ref 70–99)
Glucose-Capillary: 189 mg/dL — ABNORMAL HIGH (ref 70–99)
Glucose-Capillary: 189 mg/dL — ABNORMAL HIGH (ref 70–99)
Glucose-Capillary: 203 mg/dL — ABNORMAL HIGH (ref 70–99)
Glucose-Capillary: 76 mg/dL (ref 70–99)

## 2018-04-22 MED ORDER — ACETAMINOPHEN 325 MG PO TABS
650.0000 mg | ORAL_TABLET | Freq: Four times a day (QID) | ORAL | Status: DC | PRN
  Administered 2018-04-22 – 2018-04-23 (×2): 650 mg via ORAL
  Filled 2018-04-22 (×2): qty 2

## 2018-04-22 MED ORDER — SODIUM CHLORIDE 0.9 % IV BOLUS
500.0000 mL | Freq: Once | INTRAVENOUS | Status: AC
  Administered 2018-04-22: 500 mL via INTRAVENOUS

## 2018-04-22 MED ORDER — SODIUM CHLORIDE 0.9 % IV SOLN
1.0000 g | Freq: Three times a day (TID) | INTRAVENOUS | Status: AC
  Administered 2018-04-22 – 2018-04-24 (×6): 1 g via INTRAVENOUS
  Filled 2018-04-22 (×6): qty 1

## 2018-04-22 MED ORDER — POTASSIUM CHLORIDE 10 MEQ/100ML IV SOLN
INTRAVENOUS | Status: AC
  Filled 2018-04-22: qty 100

## 2018-04-22 NOTE — Progress Notes (Signed)
Physical Therapy Treatment Patient Details Name: Angela Harvey MRN: [id number] DOB: 06/17/1950 Today's Date: 04/22/2018    History of Present Illness Angela Harvey is an 67 y.o. female past medical history of diabetes mellitus type 2 ,dementia presents to the emergency department 04/20/18 with progressive weakness and shortness of breath, hypothermic in DKA, sepsis due to E. coli bacteremia.    PT Comments    TThe  Patient required min assist for standing and taking small steps using RW to bed for test. Continue PT.  Follow Up Recommendations  SNF     Equipment Recommendations  (tbd)    Recommendations for Other Services OT consult     Precautions / Restrictions Precautions Precautions: Fall    Mobility  Bed Mobility Overal bed mobility: Needs Assistance Bed Mobility: Sit to Supine Rolling: Min assist    Sit to supine: Min assist   General bed mobility comments: assist with legs  Transfers Overall transfer level: Needs assistance Equipment used: Rolling walker (2 wheeled) Transfers: Sit to/from [facility] Sit to Stand: Min assist Stand pivot transfers: Min assist       General transfer comment: patient stands and takes steps to bed from recliner x 5 '  Ambulation/Gait                 Stairs             Wheelchair Mobility    Modified Rankin (Stroke Patients Only)       Balance Overall balance assessment: Needs assistance Sitting-balance support: Feet supported;No upper extremity supported Sitting balance-Leahy Scale: Fair     Standing balance support: During functional activity;Bilateral upper extremity supported Standing balance-Leahy Scale: Poor Standing balance comment: relies on UE support                            Cognition Arousal/Alertness: Awake/alert Behavior During Therapy: WFL for tasks assessed/performed Overall Cognitive Status: History of cognitive impairments - at baseline Area of  Impairment: Orientation;Memory;Problem solving                 Orientation Level: Disoriented to;Place;Time;Situation   Memory: Decreased short-term memory       Problem Solving: Slow processing;Requires verbal cues        Exercises      General Comments        Pertinent Vitals/Pain Pain Assessment: No/denies pain    Home Living Family/patient expects to be discharged to:: Private residence Living Arrangements: Spouse/significant other Available Help at Discharge: Family Type of Home: House Home Access: Stairs to enter Entrance Stairs-Rails: Right;Left Home Layout: Two level;Bed/bath upstairs Home Equipment: None      Prior Function Level of Independence: Independent      Comments: until recently was  getting ill and required assistance   PT Goals (current goals can now be found in the care plan section) Acute Rehab PT Goals Patient Stated Goal: to get out of theis bed PT Goal Formulation: With patient/family Time For Goal Achievement: 05/06/18 Potential to Achieve Goals: Good Progress towards PT goals: Progressing toward goals    Frequency    Min 3X/week      PT Plan Current plan remains appropriate    Co-evaluation              AM-PAC PT "6 Clicks" Daily Activity  Outcome Measure  Difficulty turning over in bed (including adjusting bedclothes, sheets and blankets)?: A Lot Difficulty moving from lying on  back to sitting on the side of the bed? : A Lot Difficulty sitting down on and standing up from a chair with arms (e.g., wheelchair, bedside commode, etc,.)?: A Lot Help needed moving to and from a bed to chair (including a wheelchair)?: A Lot Help needed walking in hospital room?: Total Help needed climbing 3-5 steps with a railing? : Total 6 Click Score: 10    End of Session Equipment Utilized During Treatment: Gait belt Activity Tolerance: Patient tolerated treatment well Patient left: with call bell/phone within reach;with bed  alarm set Nurse Communication: Mobility status PT Visit Diagnosis: Unsteadiness on feet (R26.81)     Time: 1916-6060 PT Time Calculation (min) (ACUTE ONLY): 14 min  Charges:  $Therapeutic Activity: 8-22 mins                     Tresa Endo PT Acute Rehabilitation Services Pager [phone] Office [phone]    Claretha Cooper 04/22/2018, 4:02 PM

## 2018-04-22 NOTE — Discharge Instructions (Signed)
Angela Harvey was admitted to the Hospital on 04/20/2018 and Discharged on Discharge Date 04/22/2018 and should be excused from work/school   for 3 days starting 04/20/2018 , may return to work/school without any restrictions.  Call [name] MD, [facility] [phone] with questions.  [name] M.D on 04/22/2018,at 12:45 PM  [facility] Office  [phone]

## 2018-04-22 NOTE — Progress Notes (Addendum)
[facility] PROGRESS NOTE    Progress Note  Angela Harvey  GYI:[id number] DOB: 1951-03-03 DOA: 04/20/2018 PCP: Burnard Bunting, MD     Brief Narrative:   Angela Harvey is an 67 y.o. female past medical history of diabetes mellitus type 2 dementia presents to the emergency department complaining of weakness and shortness of breath that started 1 day prior to admission saw her PCP her chest x-ray were ordered and she was treated with supportive care for viral infection the ED she was found to be hypothermic in DKA  Assessment/Plan:   DKA (diabetic ketoacidoses)/  Diabetes mellitus, type II ([place]): Started on the insulin drip, blood glucose is improving nicely. Bicarb is improved to 17 now back to 13 he has been on insulin drip during this time. Anion gap is closed. Potassium is 4. We will continue IV insulin IV glucose continue basic metabolic panel every 4 hours. She she is bacteremic and continues to spike fevers which can complicate correcting her acidosis.  Also in the differential will be RTA.  Sepsis due to E. coli bacteremia: Spiking fevers despite IV Rocephin, check a CBC, will broaden antibiotics to IV Zosyn.  Lactic acid and leukocytosis, has resolved BCI came positive for E. coli, sensitivities pending. Renal ultrasound to rule stones or abscesses.  Acute kidney injury: Likely prerenal azotemia in the setting of polyuria (due to hyperglycemia). Creatinine is resolved. Currently her baseline creatinine ~ 1.0.  Essential hypertension Losartan has been held due to acute renal failure. Pressure slightly elevated will use hydralazine IV PRN.  Hyperosmolar hyponatremia: Resolved with correction of hyperglycemia.  Hyperkalemia/Hypokalemic: Likely due to acidosis, she is now hypokalemic will replete orally.  Dementia with behavioral disturbance [facility]): Continue current home medications.  Morbid obesity: Estimated body mass index is 43.4 kg/m as calculated  from the following:   Height as of this encounter: 5\' 3"  (1.6 m).   Weight as of this encounter: 111.1 kg. Counseling about weight reduction  DVT prophylaxis: lovenox Family Communication:none Disposition Plan/Barrier to D/C: Keep in stepdown. Code Status:     Code Status Orders  (From admission, onward)         Start     Ordered   04/20/18 1427  Full code  Continuous     04/20/18 1426        Code Status History    Date Active Date Inactive Code Status Order ID Comments User Context   07/18/2017 1936 07/21/2017 1703 Full Code [id number]  Etta Quill, DO ED   04/28/2016 2042 04/30/2016 1318 Full Code [id number]  Kathie Rhodes, MD Inpatient   08/16/2014 1210 08/17/2014 2042 Full Code [id number]  Johnathan Hausen, MD Inpatient   08/08/2014 2005 08/16/2014 1210 Full Code [id number]  Tisovec, Fransico Him, MD Inpatient        IV Access:    Peripheral IV   Procedures and diagnostic studies:   Dg Chest Portable 1 View  Result Date: 04/20/2018 CLINICAL DATA:  Weakness.  Shortness of breath. EXAM: PORTABLE CHEST 1 VIEW COMPARISON:  Chest radiograph 04/28/2016. FINDINGS: Monitoring leads overlie the patient. Normal cardiac scratch the stable cardiac and mediastinal contours. No consolidative pulmonary opacities. IMPRESSION: No acute cardiopulmonary process. Electronically Signed   By: Lovey Newcomer M.D.   On: 04/20/2018 13:00   [place] Ekg Site Rite  Result Date: 04/20/2018 If Site Rite image not attached, placement could not be confirmed due to current cardiac rhythm.    Medical Consultants:    None.  Anti-Infectives:  IV Rocephin  Subjective:    Brush she has no complaints except for she wants to get out of bed.  Objective:    Vitals:   04/22/18 0210 04/22/18 0400 04/22/18 0401 04/22/18 0549  BP:  (!) 122/39    Pulse: (!) 119 100  100  Resp: (!) 22 (!) 21  (!) 21  Temp: (!) 101.5 F (38.6 C)  (!) 101.1 F (38.4 C) 99.5 F (37.5 C)  TempSrc: Oral  Oral Oral   SpO2: 96% 94%  96%  Weight:      Height:        Intake/Output Summary (Last 24 hours) at 04/22/2018 0724 Last data filed at 04/22/2018 0409 Gross per 24 hour  Intake 2377.86 ml  Output 900 ml  Net 1477.86 ml   Filed Weights   04/20/18 1139  Weight: 111.1 kg    Exam: General exam: In no acute distress. Respiratory system: Good air movement and clear to auscultation. Cardiovascular system: S1 & S2 heard, RRR.  Gastrointestinal system: Abdomen is nondistended, soft and nontender.  Central nervous system: Alert and oriented. No focal neurological deficits. Extremities: No pedal edema. Skin: No rashes, lesions or ulcers   Data Reviewed:    Labs: Basic Metabolic Panel: Recent Labs  Lab 04/20/18 2308 04/21/18 0322 04/21/18 1455 04/21/18 2200 04/22/18 0156  NA 135 135 136 136 135  K 3.4* 3.2* 2.6* 3.7 4.1  CL 110 111 112* 112* 111  CO2 10* 13* 15* 17* 13*  GLUCOSE 181* 146* 169* 163* 190*  BUN 34* 31* 23 18 17   CREATININE 1.15* 1.03* 0.92 0.84 0.99  CALCIUM 8.8* 8.9 9.2 9.3 9.3   GFR Estimated Creatinine Clearance: 65.2 mL/min (by C-G formula based on SCr of 0.99 mg/dL). Liver Function Tests: No results for input(s): AST, ALT, ALKPHOS, BILITOT, PROT, ALBUMIN in the last 168 hours. No results for input(s): LIPASE, AMYLASE in the last 168 hours. No results for input(s): AMMONIA in the last 168 hours. Coagulation profile No results for input(s): INR, PROTIME in the last 168 hours.  CBC: Recent Labs  Lab 04/20/18 1220 04/20/18 1229 04/21/18 0322  WBC 18.5*  --  11.9*  NEUTROABS 15.0*  --   --   HGB 13.3 15.0 13.0  HCT 42.6 44.0 39.4  MCV 98.6  --  92.7  PLT 340  --  225   Cardiac Enzymes: No results for input(s): CKTOTAL, CKMB, CKMBINDEX, TROPONINI in the last 168 hours. BNP (last 3 results) No results for input(s): PROBNP in the last 8760 hours. CBG: Recent Labs  Lab 04/22/18 0105 04/22/18 0155 04/22/18 0208 04/22/18 0310 04/22/18 0629  GLUCAP  184* 177* 203* 184* 76   D-Dimer: No results for input(s): DDIMER in the last 72 hours. Hgb A1c: Recent Labs    04/20/18 1824  HGBA1C 10.6*   Lipid Profile: No results for input(s): CHOL, HDL, LDLCALC, TRIG, CHOLHDL, LDLDIRECT in the last 72 hours. Thyroid function studies: No results for input(s): TSH, T4TOTAL, T3FREE, THYROIDAB in the last 72 hours.  Invalid input(s): FREET3 Anemia work up: No results for input(s): VITAMINB12, FOLATE, FERRITIN, TIBC, IRON, RETICCTPCT in the last 72 hours. Sepsis Labs: Recent Labs  Lab 04/20/18 1220 04/20/18 1356 04/21/18 0322  WBC 18.5*  --  11.9*  LATICACIDVEN  --  1.93* 1.9   Microbiology Recent Results (from the past 240 hour(s))  Culture, blood (routine x 2)     Status: None (Preliminary result)   Collection Time: 04/20/18  1:05  PM  Result Value Ref Range Status   Specimen Description   Final    BLOOD LEFT HAND Performed at [facility] [address], [place], [place] [address]    Special Requests   Final    BOTTLES DRAWN AEROBIC AND ANAEROBIC Blood Culture adequate volume Performed at [facility] [address], [place], [place] [address]    Culture   Final    NO GROWTH < 24 HOURS Performed at [facility] [address] [place], [place] [address]    Report Status PENDING  Incomplete  Culture, blood (routine x 2)     Status: None (Preliminary result)   Collection Time: 04/20/18  1:10 PM  Result Value Ref Range Status   Specimen Description   Final    BLOOD LEFT FOREARM Performed at [facility] [address], [place], [place] [address]    Special Requests   Final    BOTTLES DRAWN AEROBIC AND ANAEROBIC Blood Culture adequate volume Performed at [facility] [address], [place], [place] [address]    Culture  Setup Time   Final    GRAM NEGATIVE RODS AEROBIC BOTTLE ONLY CRITICAL RESULT CALLED TO, READ BACK BY AND VERIFIED WITH:  Clifton Custard [phone] FCP Performed at [facility], [place] [address], [place], [place] [address]    Culture GRAM NEGATIVE RODS  Final   Report Status PENDING  Incomplete  Blood Culture ID Panel (Reflexed)     Status: Abnormal   Collection Time: 04/20/18  1:10 PM  Result Value Ref Range Status   Enterococcus species NOT DETECTED NOT DETECTED Final   Listeria monocytogenes NOT DETECTED NOT DETECTED Final   Staphylococcus species NOT DETECTED NOT DETECTED Final   Staphylococcus aureus (BCID) NOT DETECTED NOT DETECTED Final   Streptococcus species NOT DETECTED NOT DETECTED Final   Streptococcus agalactiae NOT DETECTED NOT DETECTED Final   Streptococcus pneumoniae NOT DETECTED NOT DETECTED Final   Streptococcus pyogenes NOT DETECTED NOT DETECTED Final   Acinetobacter baumannii NOT DETECTED NOT DETECTED Final   Enterobacteriaceae species DETECTED (A) NOT DETECTED Final    Comment: Enterobacteriaceae represent a large family of gram-negative bacteria, not a single organism. CRITICAL RESULT CALLED TO, READ BACK BY AND VERIFIED WITH: PHARMD MEREDITH R [phone] FCP    Enterobacter cloacae complex NOT DETECTED NOT DETECTED Final   Escherichia coli DETECTED (A) NOT DETECTED Final    Comment: CRITICAL RESULT CALLED TO, READ BACK BY AND VERIFIED WITH: PHARMD MEREDITH R [phone] FCP    Klebsiella oxytoca NOT DETECTED NOT DETECTED Final   Klebsiella pneumoniae NOT DETECTED NOT DETECTED Final   Proteus species NOT DETECTED NOT DETECTED Final   Serratia marcescens NOT DETECTED NOT DETECTED Final   Carbapenem resistance NOT DETECTED NOT DETECTED Final   Haemophilus influenzae NOT DETECTED NOT DETECTED Final   Neisseria meningitidis NOT DETECTED NOT DETECTED Final   Pseudomonas aeruginosa NOT DETECTED NOT DETECTED Final   Candida albicans NOT DETECTED NOT DETECTED Final   Candida glabrata NOT DETECTED NOT DETECTED Final   Candida krusei NOT DETECTED NOT DETECTED Final   Candida  parapsilosis NOT DETECTED NOT DETECTED Final   Candida tropicalis NOT DETECTED NOT DETECTED Final    Comment: Performed at [facility], [place] [address] [place], [place] [address]  MRSA PCR Screening     Status: None   Collection Time: 04/21/18 12:44 AM  Result Value Ref  Range Status   MRSA by PCR NEGATIVE NEGATIVE Final    Comment:        The GeneXpert MRSA Assay (FDA approved for NASAL specimens only), is one component of a comprehensive MRSA colonization surveillance program. It is not intended to diagnose MRSA infection nor to guide or monitor treatment for MRSA infections. Performed at [facility] [place] [address], [place], [place] [address]      Medications:   . amLODipine  10 mg Oral Daily  . heparin  5,000 Units Subcutaneous Q8H  . mouth rinse  15 mL Mouth Rinse BID  . memantine  28 mg Oral Daily  . pantoprazole  40 mg Oral Daily  . potassium chloride  40 mEq Oral TID   Continuous Infusions: . sodium chloride Stopped (04/20/18 2014)  . ceFEPime (MAXIPIME) IV    . dextrose 5 % and 0.45% NaCl 75 mL/hr at 04/22/18 0409  . insulin 5 mL/hr at 04/22/18 0409      LOS: 2 days   [name]  [facility]   *Please refer to [email or web address], password [email or web address] to get updated schedule on who will round on this patient, as hospitalists switch teams weekly. If 7PM-7AM, please contact night-coverage at [email or web address], password [email or web address] for any overnight needs.  04/22/2018, 7:24 AM

## 2018-04-22 NOTE — Evaluation (Signed)
Physical Therapy Evaluation Patient Details Name: Angela Harvey MRN: [id number] DOB: 1951/01/04 Today's Date: 04/22/2018   History of Present Illness  Angela Harvey is an 67 y.o. female past medical history of diabetes mellitus type 2 ,dementia presents to the emergency department 04/20/18 with progressive weakness and shortness of breath, hypothermic in DKA, sepsis due to E. coli bacteremia.  Clinical Impression  The patient aroused easily, expresses  Relief to be out of bed. Patient required 1 assist for transfers today to recliner. Spouse present. Patient is home alone while spouse works. Patient may benefit from short term rehab unless has 24/7 caregivers initially due to history of dementia. Pt admitted with above diagnosis. Pt currently with functional limitations due to the deficits listed below (see PT Problem List).  Pt will benefit from skilled PT to increase their independence and safety with mobility to allow discharge to the venue listed below.     Follow Up Recommendations SNF    Equipment Recommendations  (tbd)    Recommendations for Other Services OT consult     Precautions / Restrictions Precautions Precautions: Fall      Mobility  Bed Mobility Overal bed mobility: Needs Assistance Bed Mobility: Rolling;Sidelying to Sit Rolling: Min assist   Supine to sit: Mod assist     General bed mobility comments: assist with shoulders to sitting up  Transfers Overall transfer level: Needs assistance Equipment used: Rolling walker (2 wheeled) Transfers: Sit to/from [facility] Sit to Stand: Min assist Stand pivot transfers: Mod assist       General transfer comment: min assist to rise and steady at RW. Stood x 45". Sat down on bed edge, then stood and took  several small steps to recliner/  Ambulation/Gait                Stairs            Wheelchair Mobility    Modified Rankin (Stroke Patients Only)       Balance Overall  balance assessment: Needs assistance Sitting-balance support: Feet supported;No upper extremity supported Sitting balance-Leahy Scale: Fair     Standing balance support: During functional activity;Bilateral upper extremity supported Standing balance-Leahy Scale: Poor Standing balance comment: relies on UE support                             Pertinent Vitals/Pain Pain Assessment: No/denies pain    Home Living Family/patient expects to be discharged to:: Private residence Living Arrangements: Spouse/significant other Available Help at Discharge: Family Type of Home: House Home Access: Stairs to enter Entrance Stairs-Rails: Psychiatric nurse of Steps: 4 Home Layout: Two level;Bed/bath upstairs Home Equipment: None      Prior Function Level of Independence: Independent         Comments: until recently was  getting ill and required assistance     Hand Dominance   Dominant Hand: Left    Extremity/Trunk Assessment   Upper Extremity Assessment Upper Extremity Assessment: Defer to OT evaluation    Lower Extremity Assessment Lower Extremity Assessment: Generalized weakness    Cervical / Trunk Assessment Cervical / Trunk Assessment: Normal  Communication   Communication: No difficulties  Cognition Arousal/Alertness: Awake/alert Behavior During Therapy: WFL for tasks assessed/performed Overall Cognitive Status: History of cognitive impairments - at baseline Area of Impairment: Orientation;Memory;Problem solving                 Orientation Level: Disoriented to;Place;Time;Situation  Memory: Decreased short-term memory       Problem Solving: Slow processing;Requires verbal cues        General Comments      Exercises     Assessment/Plan    PT Assessment Patient needs continued PT services  PT Problem List Decreased strength;Decreased cognition;Decreased knowledge of use of DME;Decreased safety awareness;Decreased  activity tolerance;Decreased balance;Decreased knowledge of precautions;Decreased mobility       PT Treatment Interventions DME instruction;Gait training;Stair training;Functional mobility training;Therapeutic activities;Therapeutic exercise;Patient/family education    PT Goals (Current goals can be found in the Care Plan section)  Acute Rehab PT Goals Patient Stated Goal: to get out of theis bed PT Goal Formulation: With patient/family Time For Goal Achievement: 05/06/18 Potential to Achieve Goals: Good    Frequency Min 3X/week   Barriers to discharge Decreased caregiver support spouse works, no family able to stay    Co-evaluation               AM-PAC PT "6 Clicks" Daily Activity  Outcome Measure Difficulty turning over in bed (including adjusting bedclothes, sheets and blankets)?: A Lot Difficulty moving from lying on back to sitting on the side of the bed? : A Lot Difficulty sitting down on and standing up from a chair with arms (e.g., wheelchair, bedside commode, etc,.)?: Unable Help needed moving to and from a bed to chair (including a wheelchair)?: Total Help needed walking in hospital room?: Total Help needed climbing 3-5 steps with a railing? : Total 6 Click Score: 8    End of Session Equipment Utilized During Treatment: Gait belt Activity Tolerance: Patient tolerated treatment well Patient left: with call bell/phone within reach;with chair alarm set;with family/visitor present Nurse Communication: Mobility status PT Visit Diagnosis: Unsteadiness on feet (R26.81)    Time: 2878-6767 PT Time Calculation (min) (ACUTE ONLY): 31 min   Charges:   PT Evaluation $PT Eval Low Complexity: 1 Low PT Treatments $Therapeutic Activity: 8-22 mins        Tresa Endo PT Acute Rehabilitation Services Pager [phone] Office [phone]   Claretha Cooper 04/22/2018, 3:31 PM

## 2018-04-23 DIAGNOSIS — Z794 Long term (current) use of insulin: Secondary | ICD-10-CM

## 2018-04-23 DIAGNOSIS — N289 Disorder of kidney and ureter, unspecified: Secondary | ICD-10-CM

## 2018-04-23 DIAGNOSIS — R7881 Bacteremia: Secondary | ICD-10-CM

## 2018-04-23 DIAGNOSIS — E1110 Type 2 diabetes mellitus with ketoacidosis without coma: Secondary | ICD-10-CM

## 2018-04-23 DIAGNOSIS — E1310 Other specified diabetes mellitus with ketoacidosis without coma: Secondary | ICD-10-CM

## 2018-04-23 DIAGNOSIS — I10 Essential (primary) hypertension: Secondary | ICD-10-CM

## 2018-04-23 DIAGNOSIS — R509 Fever, unspecified: Secondary | ICD-10-CM

## 2018-04-23 LAB — BASIC METABOLIC PANEL
Anion gap: 8 (ref 5–15)
Anion gap: 9 (ref 5–15)
Anion gap: 8 (ref 5–15)
Anion gap: 11 (ref 5–15)
BUN: 10 mg/dL (ref 8–23)
BUN: 11 mg/dL (ref 8–23)
BUN: 10 mg/dL (ref 8–23)
BUN: 10 mg/dL (ref 8–23)
CO2: 17 mmol/L — ABNORMAL LOW (ref 22–32)
CO2: 15 mmol/L — ABNORMAL LOW (ref 22–32)
CO2: 16 mmol/L — ABNORMAL LOW (ref 22–32)
CO2: 16 mmol/L — ABNORMAL LOW (ref 22–32)
Calcium: 9.2 mg/dL (ref 8.9–10.3)
Calcium: 9 mg/dL (ref 8.9–10.3)
Calcium: 9.2 mg/dL (ref 8.9–10.3)
Calcium: 9.2 mg/dL (ref 8.9–10.3)
Chloride: 112 mmol/L — ABNORMAL HIGH (ref 98–111)
Chloride: 110 mmol/L (ref 98–111)
Chloride: 112 mmol/L — ABNORMAL HIGH (ref 98–111)
Chloride: 106 mmol/L (ref 98–111)
Creatinine, Ser: 0.79 mg/dL (ref 0.44–1.00)
Creatinine, Ser: 0.74 mg/dL (ref 0.44–1.00)
Creatinine, Ser: 0.74 mg/dL (ref 0.44–1.00)
Creatinine, Ser: 0.84 mg/dL (ref 0.44–1.00)
GFR calc Af Amer: >60 mL/min (ref >60)
GFR calc Af Amer: >60 mL/min (ref >60)
GFR calc Af Amer: >60 mL/min (ref >60)
GFR calc Af Amer: >60 mL/min (ref >60)
GFR calc non Af Amer: >60 mL/min (ref >60)
GFR calc non Af Amer: >60 mL/min (ref >60)
GFR calc non Af Amer: >60 mL/min (ref >60)
GFR calc non Af Amer: >60 mL/min (ref >60)
Glucose, Bld: 162 mg/dL — ABNORMAL HIGH (ref 70–99)
Glucose, Bld: 204 mg/dL — ABNORMAL HIGH (ref 70–99)
Glucose, Bld: 145 mg/dL — ABNORMAL HIGH (ref 70–99)
Glucose, Bld: 201 mg/dL — ABNORMAL HIGH (ref 70–99)
Potassium: 3.9 mmol/L (ref 3.5–5.1)
Potassium: 4 mmol/L (ref 3.5–5.1)
Potassium: 3.5 mmol/L (ref 3.5–5.1)
Potassium: 3.9 mmol/L (ref 3.5–5.1)
Sodium: 137 mmol/L (ref 135–145)
Sodium: 134 mmol/L — ABNORMAL LOW (ref 135–145)
Sodium: 136 mmol/L (ref 135–145)
Sodium: 133 mmol/L — ABNORMAL LOW (ref 135–145)

## 2018-04-23 LAB — GLUCOSE, CAPILLARY
Glucose-Capillary: 106 mg/dL — ABNORMAL HIGH (ref 70–99)
Glucose-Capillary: 111 mg/dL — ABNORMAL HIGH (ref 70–99)
Glucose-Capillary: 120 mg/dL — ABNORMAL HIGH (ref 70–99)
Glucose-Capillary: 139 mg/dL — ABNORMAL HIGH (ref 70–99)
Glucose-Capillary: 149 mg/dL — ABNORMAL HIGH (ref 70–99)
Glucose-Capillary: 151 mg/dL — ABNORMAL HIGH (ref 70–99)
Glucose-Capillary: 155 mg/dL — ABNORMAL HIGH (ref 70–99)
Glucose-Capillary: 160 mg/dL — ABNORMAL HIGH (ref 70–99)
Glucose-Capillary: 166 mg/dL — ABNORMAL HIGH (ref 70–99)
Glucose-Capillary: 167 mg/dL — ABNORMAL HIGH (ref 70–99)
Glucose-Capillary: 189 mg/dL — ABNORMAL HIGH (ref 70–99)
Glucose-Capillary: 190 mg/dL — ABNORMAL HIGH (ref 70–99)
Glucose-Capillary: 194 mg/dL — ABNORMAL HIGH (ref 70–99)
Glucose-Capillary: 207 mg/dL — ABNORMAL HIGH (ref 70–99)
Glucose-Capillary: 208 mg/dL — ABNORMAL HIGH (ref 70–99)
Glucose-Capillary: 77 mg/dL (ref 70–99)
Glucose-Capillary: 82 mg/dL (ref 70–99)
Glucose-Capillary: 83 mg/dL (ref 70–99)

## 2018-04-23 LAB — CULTURE, BLOOD (ROUTINE X 2): Special Requests: ADEQUATE

## 2018-04-23 MED ORDER — INSULIN GLARGINE 100 UNIT/ML ~~LOC~~ SOLN
20.0000 [IU] | Freq: Every morning | SUBCUTANEOUS | Status: DC
  Administered 2018-04-23 – 2018-04-25 (×3): 20 [IU] via SUBCUTANEOUS
  Filled 2018-04-23 (×3): qty 0.2

## 2018-04-23 MED ORDER — INSULIN ASPART 100 UNIT/ML ~~LOC~~ SOLN
0.0000 [IU] | Freq: Three times a day (TID) | SUBCUTANEOUS | Status: DC
  Administered 2018-04-24: 7 [IU] via SUBCUTANEOUS
  Administered 2018-04-24: 20 [IU] via SUBCUTANEOUS
  Administered 2018-04-24: 3 [IU] via SUBCUTANEOUS
  Administered 2018-04-25: 7 [IU] via SUBCUTANEOUS

## 2018-04-23 MED ORDER — SERTRALINE HCL 100 MG PO TABS
100.0000 mg | ORAL_TABLET | Freq: Every day | ORAL | Status: DC
  Administered 2018-04-23 – 2018-04-25 (×3): 100 mg via ORAL
  Filled 2018-04-23 (×3): qty 1

## 2018-04-23 MED ORDER — INSULIN ASPART 100 UNIT/ML ~~LOC~~ SOLN
10.0000 [IU] | Freq: Three times a day (TID) | SUBCUTANEOUS | Status: DC
  Administered 2018-04-23 – 2018-04-25 (×5): 10 [IU] via SUBCUTANEOUS

## 2018-04-23 MED ORDER — CALCIUM CARBONATE ANTACID 500 MG PO CHEW: 1.0000 | CHEWABLE_TABLET | Freq: Two times a day (BID) | ORAL | Status: DC | PRN

## 2018-04-23 MED ORDER — SULFAMETHOXAZOLE-TRIMETHOPRIM 800-160 MG PO TABS
1.0000 | ORAL_TABLET | Freq: Two times a day (BID) | ORAL | Status: DC
  Administered 2018-04-24 – 2018-04-25 (×3): 1 via ORAL
  Filled 2018-04-23 (×3): qty 1

## 2018-04-23 MED ORDER — LOSARTAN POTASSIUM 50 MG PO TABS
50.0000 mg | ORAL_TABLET | Freq: Every day | ORAL | Status: DC
  Administered 2018-04-23 – 2018-04-25 (×3): 50 mg via ORAL
  Filled 2018-04-23 (×3): qty 1

## 2018-04-23 MED ORDER — INSULIN ASPART 100 UNIT/ML FLEXPEN: 10.0000 [IU] | PEN_INJECTOR | Freq: Three times a day (TID) | SUBCUTANEOUS | Status: DC

## 2018-04-23 MED ORDER — INSULIN DEGLUDEC 200 UNIT/ML ~~LOC~~ SOPN: 20.0000 [IU] | PEN_INJECTOR | Freq: Every morning | SUBCUTANEOUS | Status: DC

## 2018-04-23 MED ORDER — LIP MEDEX EX OINT
TOPICAL_OINTMENT | CUTANEOUS | Status: DC | PRN
  Administered 2018-04-23: 1 via TOPICAL
  Filled 2018-04-23 (×2): qty 7

## 2018-04-23 NOTE — Evaluation (Addendum)
Occupational Therapy Evaluation Patient Details Name: Angela Harvey MRN: [id number] DOB: Nov 06, 1950 Today's Date: 04/23/2018    History of Present Illness Angela Harvey is an 67 y.o. female past medical history of diabetes mellitus type 2 ,dementia presents to the emergency department 04/20/18 with progressive weakness and shortness of breath, hypothermic in DKA, sepsis due to E. coli bacteremia.   Clinical Impression   Pt was admitted for the above.  She is normally independent with all adls.  Pt needs overall min A for LB and min guard to stand. Will follow in acute setting with supervision level goals    Follow Up Recommendations  SNF vs Supervision/Assistance - 24 hour;Home health OT    Equipment Recommendations  (to be further assessed: has tub/reg commode)    Recommendations for Other Services       Precautions / Restrictions Precautions Precautions: Fall Restrictions Weight Bearing Restrictions: No      Mobility Bed Mobility               General bed mobility comments: oob  Transfers   Equipment used: Rolling walker (2 wheeled)   Sit to Stand: Min guard         General transfer comment: for safety/lines    Balance             Standing balance-Leahy Scale: Fair Standing balance comment: walker placed in front of pt but she was able to release hand                           ADL either performed or assessed with clinical judgement   ADL Overall ADL's : Needs assistance/impaired     Grooming: Set up;Oral care;Brushing hair;Sitting   Upper Body Bathing: Set up   Lower Body Bathing: Minimal assistance;Sit to/from stand   Upper Body Dressing : Minimal assistance;Sitting(lines)   Lower Body Dressing: Moderate assistance;Sit to/from stand                 General ADL Comments: pt up in chair and plans transfer to med sx today.  Stood briefly but did not transfer this session     Vision         Perception     Praxis       Pertinent Vitals/Pain Pain Assessment: No/denies pain     Hand Dominance Left   Extremity/Trunk Assessment Upper Extremity Assessment Upper Extremity Assessment: Overall WFL for tasks assessed           Communication Communication Communication: No difficulties   Cognition Arousal/Alertness: Awake/alert Behavior During Therapy: WFL for tasks assessed/performed Overall Cognitive Status: History of cognitive impairments - at baseline                                     General Comments  vss    Exercises     Shoulder Instructions      Home Living Family/patient expects to be discharged to:: Private residence Living Arrangements: Spouse/significant other Available Help at Discharge: Family               Bathroom Shower/Tub: Teacher, early years/pre: Standard     Home Equipment: None          Prior Functioning/Environment Level of Independence: Independent        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  OT Problem List: Decreased strength;Decreased activity tolerance;Pain;Decreased knowledge of use of DME or AE;Impaired balance (sitting and/or standing);Decreased cognition      OT Treatment/Interventions: Self-care/ADL training;Energy conservation;DME and/or AE instruction;Visual/perceptual remediation/compensation;Patient/family education;Balance training;Therapeutic activities    OT Goals(Current goals can be found in the care plan section) Acute Rehab OT Goals Patient Stated Goal: return to independence OT Goal Formulation: With patient Time For Goal Achievement: 05/07/18 Potential to Achieve Goals: Good ADL Goals Pt Will Perform Grooming: with supervision;standing Pt Will Perform Lower Body Bathing: with supervision;sit to/from stand Pt Will Perform Lower Body Dressing: with supervision;sit to/from stand Pt Will Transfer to Toilet: with supervision;ambulating;regular height toilet;bedside commode(vs) Pt  Will Perform Toileting - Clothing Manipulation and hygiene: with supervision;sit to/from stand  OT Frequency: Min 2X/week   Barriers to D/C:            Co-evaluation              AM-PAC PT "6 Clicks" Daily Activity     Outcome Measure Help from another person eating meals?: None Help from another person taking care of personal grooming?: A Little Help from another person toileting, which includes using toliet, bedpan, or urinal?: A Little Help from another person bathing (including washing, rinsing, drying)?: A Little Help from another person to put on and taking off regular upper body clothing?: A Little Help from another person to put on and taking off regular lower body clothing?: A Lot 6 Click Score: 18   End of Session    Activity Tolerance: Patient tolerated treatment well Patient left: in chair;with call bell/phone within reach;with family/visitor present  OT Visit Diagnosis: Unsteadiness on feet (R26.81);Muscle weakness (generalized) (M62.81)                Time: 3545-6256 OT Time Calculation (min): 23 min Charges:  OT General Charges $OT Visit: 1 Visit OT Evaluation $OT Eval Low Complexity: [place], OTR/L Acute Rehabilitation Services [phone] [facility] pager [phone] office 04/23/2018  [place] 04/23/2018, 3:40 PM

## 2018-04-23 NOTE — Progress Notes (Signed)
Attending MD note  Patient was seen, examined,treatment plan was discussed with the PA-Student.  I have personally reviewed the clinical findings, lab, imaging studies and management of this patient in detail. I agree with the documentation, as recorded by the PA-Student  DEA Angela Harvey is a 67 y.o. year old female with medical history significant for diabetes type 2, hypertension, dementia who presented on 04/20/2018 with one week of chills, SOB, and weakness, nausea, vomiting and abdominal pain and was found to have DKA with E.Coli bacteremia of unclear source.  Physical: Vitals:   04/23/18 1105 04/23/18 1200  BP:    Pulse: 79   Resp: (!) 0   Temp:  99.6 F (37.6 C)  SpO2: 96%     Constitutional:normal appearing female Eyes: EOMI, anicteric, normal conjunctivae ENMT: Oropharynx with moist mucous membranes, normal dentition Cardiovascular: RRR no MRGs, with no peripheral edema Respiratory: Normal respiratory effort on room air, clear breath sounds  Abdomen: Soft,non-tender, with no HSM Skin: No rash ulcers, or lesions. Without skin tenting  Neurologic: Grossly no focal neuro deficit. Psychiatric:Appropriate affect, and mood. Mental status AAOx3  Plan  1. Type 2 DM, uncontrolled. A1c > 10%. DKA resolved. Will transition to home lantus regimen but suspect may need to increase in next 24 hours, close monitoring 2. DKA, resolved. Anion gap remains closed. Insulin gtt was continued for fevers will transition to subcut insulin and eat. Transfer to floor.  3. E.coli bacteremia, stable. UA unremarkable. Renal ultrasound with no abscesses. 24 hours without fever on cefepime ( was febrile on ceftriaxone) after discussion with Dr. Baxter Flattery via phone curbside will switch to oral bactrim for additional 5 days on  11/14 to complete 7 days of total therapy 4. Sepsis, resolved. Afebrile, WBC wnl. Unclear etiology 5. AKI, resolved. Resume home losartan, closely monitor  Rest as below  [place]  [facility] If 7PM-7AM, please contact night-coverage [email or web address] Password [email or web address] 04/23/2018, 1:36 PM        PROGRESS NOTE    Angela Harvey  JME:[id number] DOB: February 15, 1951 DOA: 04/20/2018 PCP: Burnard Bunting, MD  Outpatient Specialists:     Brief Narrative: Angela Harvey is a 67 y.o patient with a history of type 2 DM and dementia. She presented with SOB and weakness of 1 week. In the ED, she was found to be hypothermic and in DKA. Also, had elevated WBC and lactic acid. Chest x-ray and UA done in the ER was unremarkable. Assessment & Plan:   Diabetic Ketoacidoses:          -Patient has been treated with insulin drip and fluids, anion gas has closed.         -Transitioned insulin drip to IM insulin treatment, Lantus 10 units every morning and novolog 10 units TID with meals.         - Blood glucose has been trending down, but still elevated. Last reported blood glucose is 201. Continue monitoring blood glucose. Will adjust insulin            treatment based on blood glucose levels.   SIRS due to E.coli bacteremia:          -Patient is afebrile today. Also, WBC is trending down. Will switch IV antibiotics to PO after consulting ID.   Acute Kidney injury: resolved. Current creatinine at 0.84, which is a baseline for patient.  Hypokalemia: stabilized, but patient has a history of hyperkalemia, so continue monitoring potassium levels.   Essential hypertension: has elevated BP. Last  reported at 163/111. Restarted Losartan 50 mg PO daily. Also, continue giving amlodipine 10 mg PO.   Dementia: No acute behavioral change. Continue on home medications. memantine 28 mg PO daily.  Depression/Anxiety: Continue on sertraline 100 mg PO daily.   GERD: Continue on Protonix 40 mg PO daily.   Morbid obesity: BMI 43.40 kg/m2. Follow up with out patient provider.    DVT prophylaxis: Heparin Family Communication: Patient's husband was at bedside at time of interview.    Disposition Plan: awaiting on how patient respond to IM insulin.  Consultants: Will consult ID for PO antibiotics for the treatment of e.coli bacteremia.   Procedures: None Antimicrobials: Cefepime Subjective: Patient is doing relatively better today. Denied weakness, SOB, chest pain.    Objective: Vitals:   04/23/18 0700 04/23/18 0800 04/23/18 1000 04/23/18 1105  BP: (!) 163/111  140/80   Pulse: (!) 106 87 (!) 101 79  Resp: (!) 8 (!) 34 (!) 28 (!) 0  Temp:  98.7 F (37.1 C)    TempSrc:  Oral    SpO2: 99% 95% 99% 96%  Weight:      Height:        Intake/Output Summary (Last 24 hours) at 04/23/2018 1336 Last data filed at 04/23/2018 1000 [place] per 24 hour  Intake 1106.43 ml  Output 2050 ml  Net -943.57 ml   Filed Weights   04/20/18 1139  Weight: 111.1 kg    Examination:  General exam: Appears calm and comfortable  Respiratory system: Clear to auscultation. Respiratory effort normal. Cardiovascular system: S1 & S2 heard, RRR. No JVD, murmurs, rubs, gallops or clicks. No pedal edema. Gastrointestinal system: Abdomen is nondistended, soft and nontender. No organomegaly or masses felt. Normal bowel sounds heard. Central nervous system: Alert and oriented. No focal neurological deficits. Extremities: Symmetric 5 x 5 power. Skin: No rashes, lesions or ulcers Psychiatry: Judgement and insight reduced given dementia history. Mood & affect appropriate.     Data Reviewed: I have personally reviewed following labs and imaging studies  CBC: Recent Labs  Lab 04/20/18 1220 04/20/18 1229 04/21/18 0322  WBC 18.5*  --  11.9*  NEUTROABS 15.0*  --   --   HGB 13.3 15.0 13.0  HCT 42.6 44.0 39.4  MCV 98.6  --  92.7  PLT 340  --  295   Basic Metabolic Panel: Recent Labs  Lab 04/22/18 1827 04/22/18 2248 04/23/18 0246 04/23/18 0610 04/23/18 1015  NA 135 137 134* 136 133*  K 4.2 3.9 4.0 3.5 3.9  CL 112* 112* 110 112* 106  CO2 15* 17* 15* 16* 16*  GLUCOSE 159* 162* 204*  145* 201*  BUN 11 10 11 10 10   CREATININE 0.76 0.79 0.74 0.74 0.84  CALCIUM 9.0 9.2 9.0 9.2 9.2   GFR: Estimated Creatinine Clearance: 76.8 mL/min (by C-G formula based on SCr of 0.84 mg/dL). Liver Function Tests: No results for input(s): AST, ALT, ALKPHOS, BILITOT, PROT, ALBUMIN in the last 168 hours. No results for input(s): LIPASE, AMYLASE in the last 168 hours. No results for input(s): AMMONIA in the last 168 hours. Coagulation Profile: No results for input(s): INR, PROTIME in the last 168 hours. Cardiac Enzymes: No results for input(s): CKTOTAL, CKMB, CKMBINDEX, TROPONINI in the last 168 hours. BNP (last 3 results) No results for input(s): PROBNP in the last 8760 hours. HbA1C: Recent Labs    04/20/18 1824  HGBA1C 10.6*   CBG: Recent Labs  Lab 04/23/18 0817 04/23/18 0925 04/23/18 1029 04/23/18 1131 04/23/18  1240  GLUCAP 82 155* 189* 194* 208*   Lipid Profile: No results for input(s): CHOL, HDL, LDLCALC, TRIG, CHOLHDL, LDLDIRECT in the last 72 hours. Thyroid Function Tests: No results for input(s): TSH, T4TOTAL, FREET4, T3FREE, THYROIDAB in the last 72 hours. Anemia Panel: No results for input(s): VITAMINB12, FOLATE, FERRITIN, TIBC, IRON, RETICCTPCT in the last 72 hours. Urine analysis:    Component Value Date/Time   COLORURINE YELLOW 04/20/2018 1220   APPEARANCEUR HAZY (A) 04/20/2018 1220   LABSPEC 1.016 04/20/2018 1220   PHURINE 5.0 04/20/2018 1220   GLUCOSEU >=500 (A) 04/20/2018 1220   HGBUR MODERATE (A) 04/20/2018 1220   BILIRUBINUR NEGATIVE 04/20/2018 1220   KETONESUR 80 (A) 04/20/2018 1220   PROTEINUR NEGATIVE 04/20/2018 1220   UROBILINOGEN 0.2 08/08/2014 1505   NITRITE NEGATIVE 04/20/2018 1220   LEUKOCYTESUR NEGATIVE 04/20/2018 1220   Sepsis Labs: @LABRCNTIP (procalcitonin:4,lacticidven:4)  ) Recent Results (from the past 240 hour(s))  Culture, blood (routine x 2)     Status: None (Preliminary result)   Collection Time: 04/20/18  1:05 PM  Result  Value Ref Range Status   Specimen Description   Final    BLOOD LEFT HAND Performed at [facility], [place] [address] [place], [place] [address]    Special Requests   Final    BOTTLES DRAWN AEROBIC AND ANAEROBIC Blood Culture adequate volume Performed at [place] [address] [place], [place] [address]    Culture  Setup Time   Final    GRAM NEGATIVE RODS AEROBIC BOTTLE ONLY Performed at [facility], [place] [address] [place], [place] [address]    Culture GRAM NEGATIVE RODS  Final   Report Status PENDING  Incomplete  Culture, blood (routine x 2)     Status: Abnormal   Collection Time: 04/20/18  1:10 PM  Result Value Ref Range Status   Specimen Description   Final    BLOOD LEFT FOREARM Performed at [place] [address] [place], [place] [address]    Special Requests   Final    BOTTLES DRAWN AEROBIC AND ANAEROBIC Blood Culture adequate volume Performed at [place] [address], [place], [place] [address]    Culture  Setup Time   Final    GRAM NEGATIVE RODS AEROBIC BOTTLE ONLY CRITICAL RESULT CALLED TO, READ BACK BY AND VERIFIED WITH: Clifton Custard [id number] FCP Performed at [facility], [place] [address], [place], [place] [address]    Culture ESCHERICHIA COLI (A)  Final   Report Status 04/23/2018 FINAL  Final   Organism ID, Bacteria ESCHERICHIA COLI  Final      Susceptibility   Escherichia coli - MIC*    AMPICILLIN <=2 SENSITIVE Sensitive     CEFAZOLIN <=4 SENSITIVE Sensitive     CEFEPIME <=1 SENSITIVE Sensitive     CEFTAZIDIME <=1 SENSITIVE Sensitive     CEFTRIAXONE <=1 SENSITIVE Sensitive     CIPROFLOXACIN <=0.25 SENSITIVE Sensitive     GENTAMICIN <=1 SENSITIVE Sensitive     IMIPENEM <=0.25 SENSITIVE Sensitive     TRIMETH/SULFA <=20 SENSITIVE Sensitive     AMPICILLIN/SULBACTAM <=2 SENSITIVE Sensitive     PIP/TAZO <=4 SENSITIVE Sensitive     Extended ESBL  NEGATIVE Sensitive     * ESCHERICHIA COLI  Blood Culture ID Panel (Reflexed)     Status: Abnormal   Collection Time: 04/20/18  1:10 PM  Result Value Ref Range Status   Enterococcus species NOT  DETECTED NOT DETECTED Final   Listeria monocytogenes NOT DETECTED NOT DETECTED Final   Staphylococcus species NOT DETECTED NOT DETECTED Final   Staphylococcus aureus (BCID) NOT DETECTED NOT DETECTED Final   Streptococcus species NOT DETECTED NOT DETECTED Final   Streptococcus agalactiae NOT DETECTED NOT DETECTED Final   Streptococcus pneumoniae NOT DETECTED NOT DETECTED Final   Streptococcus pyogenes NOT DETECTED NOT DETECTED Final   Acinetobacter baumannii NOT DETECTED NOT DETECTED Final   Enterobacteriaceae species DETECTED (A) NOT DETECTED Final    Comment: Enterobacteriaceae represent a large family of gram-negative bacteria, not a single organism. CRITICAL RESULT CALLED TO, READ BACK BY AND VERIFIED WITH: PHARMD MEREDITH R [id number] FCP    Enterobacter cloacae complex NOT DETECTED NOT DETECTED Final   Escherichia coli DETECTED (A) NOT DETECTED Final    Comment: CRITICAL RESULT CALLED TO, READ BACK BY AND VERIFIED WITH: PHARMD MEREDITH R [id number] FCP    Klebsiella oxytoca NOT DETECTED NOT DETECTED Final   Klebsiella pneumoniae NOT DETECTED NOT DETECTED Final   Proteus species NOT DETECTED NOT DETECTED Final   Serratia marcescens NOT DETECTED NOT DETECTED Final   Carbapenem resistance NOT DETECTED NOT DETECTED Final   Haemophilus influenzae NOT DETECTED NOT DETECTED Final   Neisseria meningitidis NOT DETECTED NOT DETECTED Final   Pseudomonas aeruginosa NOT DETECTED NOT DETECTED Final   Candida albicans NOT DETECTED NOT DETECTED Final   Candida glabrata NOT DETECTED NOT DETECTED Final   Candida krusei NOT DETECTED NOT DETECTED Final   Candida parapsilosis NOT DETECTED NOT DETECTED Final   Candida tropicalis NOT DETECTED NOT DETECTED Final    Comment: Performed at [place], [place] [address], [place], [place] [address]  MRSA PCR Screening     Status: None   Collection Time: 04/21/18 12:44 AM  Result Value Ref Range Status   MRSA by PCR NEGATIVE NEGATIVE Final    Comment:        The GeneXpert MRSA Assay (FDA approved for NASAL specimens only), is one component of a comprehensive MRSA colonization surveillance program. It is not intended to diagnose MRSA infection nor to guide or monitor treatment for MRSA infections. Performed at [facility], [place] [address] [place], [place] [address]          Radiology Studies: US Renal  Result Date: 04/22/2018 CLINICAL DATA:  Feature, assess for stone disease or obstruction, diabetes, obesity EXAM: RENAL / URINARY TRACT ULTRASOUND COMPLETE COMPARISON:  07/18/2017 FINDINGS: Right Kidney: Renal measurements: 12.6 x 5.6 x 5.6 cm = volume: 204 mL. Mild cortical thinning. Normal echogenicity. No acute finding or hydronephrosis. Left Kidney: Renal measurements: 11.1 x 5.5 x 4.9 cm = volume: 156 mL. Similar mild cortical thinning. Normal echogenicity. No acute finding or hydronephrosis. Bladder: Appears normal for degree of bladder distention. IMPRESSION: Mild renal cortical thinning/atrophy. No other acute finding or hydronephrosis. Electronically Signed   By: Jerilynn Mages.  Shick M.D.   On: 04/22/2018 16:50        Scheduled Meds: . amLODipine  10 mg Oral Daily  . heparin  5,000 Units Subcutaneous Q8H  . insulin aspart  10 Units Subcutaneous TID WC  . insulin glargine  20 Units Subcutaneous q morning - 10a  . losartan  50 mg Oral Daily  . mouth rinse  15 mL Mouth Rinse BID  . memantine  28 mg Oral Daily  . pantoprazole  40 mg Oral Daily  . sertraline  100 mg Oral Daily   Continuous Infusions: .  sodium chloride Stopped (04/20/18 2014)  . ceFEPime (MAXIPIME) IV Stopped (04/23/18 2589)  . dextrose 5 % and 0.45% NaCl 75 mL/hr at 04/23/18 0800  . insulin 7.5 Units/hr (04/23/18 1244)     LOS: 3 days       [place]   [place]

## 2018-04-24 DIAGNOSIS — F0390 Unspecified dementia without behavioral disturbance: Secondary | ICD-10-CM

## 2018-04-24 LAB — GLUCOSE, CAPILLARY
Glucose-Capillary: 225 mg/dL — ABNORMAL HIGH (ref 70–99)
Glucose-Capillary: 374 mg/dL — ABNORMAL HIGH (ref 70–99)
Glucose-Capillary: 126 mg/dL — ABNORMAL HIGH (ref 70–99)
Glucose-Capillary: 79 mg/dL (ref 70–99)

## 2018-04-24 LAB — BASIC METABOLIC PANEL
Anion gap: 10 (ref 5–15)
BUN: 11 mg/dL (ref 8–23)
CO2: 19 mmol/L — ABNORMAL LOW (ref 22–32)
Calcium: 8.9 mg/dL (ref 8.9–10.3)
Chloride: 105 mmol/L (ref 98–111)
Creatinine, Ser: 0.84 mg/dL (ref 0.44–1.00)
GFR calc Af Amer: >60 mL/min (ref >60)
GFR calc non Af Amer: >60 mL/min (ref >60)
Glucose, Bld: 261 mg/dL — ABNORMAL HIGH (ref 70–99)
Potassium: 4 mmol/L (ref 3.5–5.1)
Sodium: 134 mmol/L — ABNORMAL LOW (ref 135–145)

## 2018-04-24 NOTE — NC FL2 (Signed)
[place] MEDICAID FL2 LEVEL OF CARE SCREENING TOOL     IDENTIFICATION  Patient Name: Angela Harvey Birthdate: November 12, 1950 Sex: female Admission Date (Current Location): 04/20/2018  [facility] and [place] Number:  Herbalist and Address:  [facility],  [place] [address], [place]      Provider Number: [id number]  Attending Physician Name and Address:  Desiree Hane, MD  Relative Name and Phone Number:       Current Level of Care: Hospital Recommended Level of Care: [place] Prior Approval Number:    Date Approved/Denied:   PASRR Number:   [id number]   Discharge Plan: SNF    Current Diagnoses: Patient Active Problem List   Diagnosis Date Noted  . Bacteremia due to Escherichia coli 04/23/2018  . Hyponatremia 04/20/2018  . Hyperkalemia 04/20/2018  . DKA (diabetic ketoacidoses) ([place]) 04/20/2018  . Dementia with behavioral disturbance ([place]) 04/20/2018  . Hypothermia 04/20/2018  . DKA, type 2 ([place]) 04/20/2018  . Hypercalcemia 07/18/2017  . Acute pancreatitis 07/18/2017  . Renal insufficiency 07/18/2017  . Ureteral calculus 04/28/2016  . Severe protein-calorie malnutrition ([place]) 08/15/2014  . Malnutrition of moderate degree ([place]) 08/15/2014  . Essential hypertension 08/14/2014  . Diabetes mellitus, type II ([place]) 08/14/2014  . GERD (gastroesophageal reflux disease) 08/14/2014  . Acute biliary pancreatitis 08/08/2014    Orientation RESPIRATION BLADDER Height & Weight     Self, Place  Normal Incontinent Weight: 245 lb (111.1 kg) Height:  5\' 3"  (160 cm)  BEHAVIORAL SYMPTOMS/MOOD NEUROLOGICAL BOWEL NUTRITION STATUS        Diet(Carb modified )  AMBULATORY STATUS COMMUNICATION OF NEEDS Skin   Extensive Assist Verbally Normal                       Personal Care Assistance Level of Assistance  Bathing, Feeding, Dressing Bathing Assistance: Limited assistance Feeding assistance: Independent Dressing Assistance:  Limited assistance     Functional Limitations Info             SPECIAL CARE FACTORS FREQUENCY  PT (By licensed PT), OT (By licensed OT)     PT Frequency: 5 OT Frequency: 5            Contractures      Additional Factors Info  Code Status, Allergies Code Status Info: Full Code  Allergies Info: NKA            Current Medications (04/24/2018):  This is the current hospital active medication list Current Facility-Administered Medications  Medication Dose Route Frequency Provider Last Rate Last Dose  . acetaminophen (TYLENOL) tablet 650 mg  650 mg Oral Q6H PRN Oretha Milch D, MD   650 mg at 04/23/18 0049  . amLODipine (NORVASC) tablet 10 mg  10 mg Oral Daily Oretha Milch D, MD   10 mg at 04/23/18 0916  . calcium carbonate (TUMS - dosed in mg elemental calcium) chewable tablet 200 mg of elemental calcium  1 tablet Oral BID BM & HS PRN Oretha Milch D, MD      . guaiFENesin-dextromethorphan (ROBITUSSIN DM) 100-10 MG/5ML syrup 5 mL  5 mL Oral Q4H PRN Oretha Milch D, MD   5 mL at 04/23/18 1018  . haloperidol lactate (HALDOL) injection 1 mg  1 mg Intravenous Q6H PRN Oretha Milch D, MD   1 mg at 04/21/18 0929  . heparin injection 5,000 Units  5,000 Units Subcutaneous Q8H Oretha Milch D, MD   5,000 Units at 04/24/18  6962  . hydrALAZINE (APRESOLINE) injection 5 mg  5 mg Intravenous Q4H PRN Oretha Milch D, MD      . insulin aspart (novoLOG) injection 0-20 Units  0-20 Units Subcutaneous TID WC Oretha Milch D, MD   7 Units at 04/24/18 0810  . insulin aspart (novoLOG) injection 10 Units  10 Units Subcutaneous TID WC Oretha Milch D, MD   10 Units at 04/23/18 1648  . insulin glargine (LANTUS) injection 20 Units  20 Units Subcutaneous q morning - 10a Desiree Hane, MD   20 Units at 04/23/18 1412  . lip balm (CARMEX) ointment   Topical PRN Desiree Hane, MD   1 application at 95/28/41 0049  . losartan (COZAAR) tablet 50 mg  50 mg Oral Daily Oretha Milch D, MD   50 mg  at 04/23/18 1413  . MEDLINE mouth rinse  15 mL Mouth Rinse BID Oretha Milch D, MD   15 mL at 04/24/18 0851  . memantine (NAMENDA XR) 24 hr capsule 28 mg  28 mg Oral Daily Oretha Milch D, MD   28 mg at 04/23/18 0917  . pantoprazole (PROTONIX) EC tablet 40 mg  40 mg Oral Daily Oretha Milch D, MD   40 mg at 04/23/18 0916  . sertraline (ZOLOFT) tablet 100 mg  100 mg Oral Daily Oretha Milch D, MD   100 mg at 04/23/18 1413  . sulfamethoxazole-trimethoprim (BACTRIM DS,SEPTRA DS) 800-160 MG per tablet 1 tablet  1 tablet Oral Q12H Desiree Hane, MD         Discharge Medications: Please see discharge summary for a list of discharge medications.  Relevant Imaging Results:  Relevant Lab Results:   Additional Information [id number]  Weston Anna, LCSW

## 2018-04-24 NOTE — Clinical Social Work Note (Signed)
Clinical Social Work Assessment  Patient Details  Name: Angela Harvey MRN: [id number] Date of Birth: [birth year]  Date of referral:  04/24/18               Reason for consult:  Facility Placement, Discharge Planning                Permission sought to share information with:    Permission granted to share information::     Name::        Agency::     Relationship::     Contact Information:     Housing/Transportation Living arrangements for the past 2 months:  Single Family Home Source of Information:  Spouse(Angela Harvey) Patient Interpreter Needed:  None Criminal Activity/Legal Involvement Pertinent to Current Situation/Hospitalization:    Significant Relationships:  Spouse Lives with:  Spouse Do you feel safe going back to the place where you live?  Yes Need for family participation in patient care:  Yes (Comment)  Care giving concerns:  No concerns voiced by patients spouse, Angela Harvey, at this time.    Social Worker assessment / plan:  CSW spoke with patients spouse, Angela Harvey, via phone to discuss disposition plans. cSW informed spouse of PT recommendations for SNF placement at discharge. Spouse is agreeable to plan and has no preference at this time of facility. Spouse states neither patient or himself has been to a SNF facility before and he is not very familiar with them. Patient lives close to [facility] and would prefer facility close by so spouse can come visit. Spouse stated he is going to get speak with their PCP to see if they recommend a facility for patient- spouse will update CSW if they have a preferred facility. CSW will complete needed information for bed search and follow up with spouse with bed offers.    Employment status:  Retired Forensic scientist:  [employer] PT Recommendations:  [place] / Referral to community resources:  [place]  Patient/Family's Response to care:  Spouse appreciated CSW.  Patient/Family's  Understanding of and Emotional Response to Diagnosis, Current Treatment, and Prognosis:  Spouse understands current disposition plans and will contact CSW if he has a preferred SNF in mind.   Emotional Assessment Appearance:    Attitude/Demeanor/Rapport:    Affect (typically observed):  Unable to Assess Orientation:  Fluctuating Orientation (Suspected and/or reported Sundowners) Alcohol / Substance use:    Psych involvement (Current and /or in the community):  No (Comment)  Discharge Needs  Concerns to be addressed:  No discharge needs identified Readmission within the last 30 days:  No Current discharge risk:  None Barriers to Discharge:  No SNF bed   Weston Anna, LCSW 04/24/2018, 10:52 AM

## 2018-04-24 NOTE — Progress Notes (Addendum)
Physical Therapy Treatment Patient Details Name: Angela Harvey MRN: [id number] DOB: 11/29/1950 Today's Date: 04/24/2018    History of Present Illness Angela Harvey is an 67 y.o. female past medical history of diabetes mellitus type 2 ,dementia presents to the emergency department 04/20/18 with progressive weakness and shortness of breath, hypothermic in DKA, sepsis due to E. coli bacteremia.    PT Comments    Pt is progressing well with mobility, she ambulated 46' with RW with min assist to negotiate obstacles. Instructed pt in BUE/LE exercises to be done independently for strengthening. Encouraged pt to start walking to bathroom with assistance, rather than using purewick external catheter, in order to increase her activity level.     Follow Up Recommendations  Supervision for mobility/OOB;SNF vs HHPT with 24 hour assist due to cognitive issues     Equipment Recommendations  None recommended by PT    Recommendations for Other Services       Precautions / Restrictions Precautions Precautions: Fall Restrictions Weight Bearing Restrictions: No    Mobility  Bed Mobility   Bed Mobility: Supine to Sit     Supine to sit: Supervision;HOB elevated     General bed mobility comments: no physical assist needed  Transfers Overall transfer level: Needs assistance Equipment used: Rolling walker (2 wheeled) Transfers: Sit to/from Stand   Stand pivot transfers: Min guard       General transfer comment: VCs hand placement, min/guard safety  Ambulation/Gait Ambulation/Gait assistance: Min guard;Min assist Gait Distance (Feet): 60 Feet Assistive device: Rolling walker (2 wheeled) Gait Pattern/deviations: Step-through pattern;Decreased stride length Gait velocity: WFL   General Gait Details: VCs to step closer to RW, intermittent min A to negotiate obstacles with RW (pt bumped into wall x 3); HR 114 walking   Stairs             Wheelchair Mobility    Modified  Rankin (Stroke Patients Only)       Balance     Sitting balance-Leahy Scale: Good       Standing balance-Leahy Scale: Fair                              Cognition Arousal/Alertness: Awake/alert Behavior During Therapy: WFL for tasks assessed/performed Overall Cognitive Status: History of cognitive impairments - at baseline                       Memory: Decreased short-term memory                Exercises General Exercises - Lower Extremity Ankle Circles/Pumps: AROM;Both;10 reps;Supine Heel Slides: AROM;Both;10 reps;Supine Shoulder Exercises Pendulum Exercise: AROM;10 reps;Seated;Both    General Comments        Pertinent Vitals/Pain Pain Assessment: No/denies pain    Home Living                      Prior Function            PT Goals (current goals can now be found in the care plan section) Acute Rehab PT Goals Patient Stated Goal: return to independence, walk her 80 lb poodle PT Goal Formulation: With patient/family Time For Goal Achievement: 05/06/18 Potential to Achieve Goals: Good Progress towards PT goals: Progressing toward goals    Frequency    Min 2X/week      PT Plan Current plan remains appropriate    Co-evaluation  AM-PAC PT "6 Clicks" Daily Activity  Outcome Measure  Difficulty turning over in bed (including adjusting bedclothes, sheets and blankets)?: A Little Difficulty moving from lying on back to sitting on the side of the bed? : A Little Difficulty sitting down on and standing up from a chair with arms (e.g., wheelchair, bedside commode, etc,.)?: A Lot Help needed moving to and from a bed to chair (including a wheelchair)?: A Little Help needed walking in hospital room?: A Little Help needed climbing 3-5 steps with a railing? : A Lot 6 Click Score: 16    End of Session Equipment Utilized During Treatment: Gait belt Activity Tolerance: Patient tolerated treatment  well Patient left: in chair;with call bell/phone within reach;with chair alarm set;with family/visitor present Nurse Communication: Mobility status PT Visit Diagnosis: Unsteadiness on feet (R26.81);Difficulty in walking, not elsewhere classified (R26.2)     Time: 3361-2244 PT Time Calculation (min) (ACUTE ONLY): 33 min  Charges:  $Gait Training: 8-22 mins $Therapeutic Exercise: 8-22 mins                     Blondell Reveal Kistler PT 04/24/2018  Acute Rehabilitation Services Pager [phone] Office [phone]

## 2018-04-24 NOTE — Progress Notes (Signed)
Occupational Therapy Treatment Patient Details Name: Angela Harvey MRN: [id number] DOB: [birth year] Today's Date: 04/24/2018    History of present illness Angela Harvey is an 67 y.o. female past medical history of diabetes mellitus type 2 ,dementia presents to the emergency department 04/20/18 with progressive weakness and shortness of breath, hypothermic in DKA, sepsis due to E. coli bacteremia.   OT comments  Performed ADL in bathroom.  Pt relied on grab bar to get up from comfort height commode. Would benefit from 3:1 over toilet at home  Follow Up Recommendations  Supervision/Assistance - 24 hour;Home health OT    Equipment Recommendations  3 in 1 bedside commode    Recommendations for Other Services      Precautions / Restrictions Precautions Precautions: Fall Restrictions Weight Bearing Restrictions: No       Mobility Bed Mobility   Bed Mobility: Supine to Sit     Supine to sit: Supervision;HOB elevated Sit to supine: Min guard;HOB elevated   General bed mobility comments: no physical assist needed  Transfers Overall transfer level: Needs assistance Equipment used: Rolling walker (2 wheeled) Transfers: Sit to/from Stand Sit to Stand: Min guard Stand pivot transfers: Min guard       General transfer comment: for safety and cues for UE placement    Balance     Sitting balance-Leahy Scale: Good       Standing balance-Leahy Scale: Fair                             ADL either performed or assessed with clinical judgement   ADL       Grooming: Set up;Oral care;Brushing hair;Sitting   Upper Body Bathing: Set up;Sitting   Lower Body Bathing: Moderate assistance;Sit to/from stand   Upper Body Dressing : Minimal assistance;Sitting       Toilet Transfer: Min guard;Ambulation;Comfort height toilet;RW   Toileting- Clothing Manipulation and Hygiene: Moderate assistance;Sit to/from stand         General ADL Comments: pt was in  bathroom on commode. Performed bathing/UB dressing, returned to bed and performed oral care from sitting.  Cues for safety with RW     Vision       Perception     Praxis      Cognition Arousal/Alertness: Awake/alert Behavior During Therapy: WFL for tasks assessed/performed Overall Cognitive Status: History of cognitive impairments - at baseline                       Memory: Decreased short-term memory                  Exercises    Shoulder Instructions       General Comments sats 95% RA    Pertinent Vitals/ Pain       Pain Assessment: No/denies pain  Home Living                                          Prior Functioning/Environment              Frequency  Min 2X/week        Progress Toward Goals  OT Goals(current goals can now be found in the care plan section)  Progress towards OT goals: Progressing toward goals  Acute Rehab OT Goals Patient Stated Goal: return to independence, walk  her 80 lb poodle  Plan      Co-evaluation                 AM-PAC PT "6 Clicks" Daily Activity     Outcome Measure   Help from another person eating meals?: None Help from another person taking care of personal grooming?: A Little Help from another person toileting, which includes using toliet, bedpan, or urinal?: A Little Help from another person bathing (including washing, rinsing, drying)?: A Little Help from another person to put on and taking off regular upper body clothing?: A Little Help from another person to put on and taking off regular lower body clothing?: A Lot 6 Click Score: 18    End of Session    OT Visit Diagnosis: Unsteadiness on feet (R26.81);Muscle weakness (generalized) (M62.81)   Activity Tolerance Patient tolerated treatment well   Patient Left in chair;with call bell/phone within reach;with family/visitor present   Nurse Communication          Time: 8616-8372 OT Time Calculation (min): 26  min  Charges: OT General Charges $OT Visit: 1 Visit OT Treatments $Self Care/Home Management : 23-37 mins  Lesle Chris, OTR/L Acute Rehabilitation Services [phone] [place] pager [phone] office 04/24/2018   Tam Savoia 04/24/2018, 2:41 PM

## 2018-04-24 NOTE — Progress Notes (Signed)
Patient has been accepted to [facility] for tomorrow 11/15- CSW notified patients spouse, Angela Harvey, who has accepted bed offer. CSW will notify RN and MD.   Kingsley Spittle, [place]  [phone]

## 2018-04-24 NOTE — Care Management Important Message (Signed)
Important Message  Patient Details  Name: Angela Harvey MRN: [id number] Date of Birth: 10-05-1950   Medicare Important Message Given:  Yes    Kerin Salen 04/24/2018, 11:46 AMImportant Message  Patient Details  Name: Angela Harvey MRN: [id number] Date of Birth: 1951-05-02   Medicare Important Message Given:  Yes    Kerin Salen 04/24/2018, 11:46 AM

## 2018-04-24 NOTE — Progress Notes (Signed)
PROGRESS NOTE  Angela Harvey EQA:[id number] DOB: [birth year] DOA: 04/20/2018 PCP: Burnard Bunting, MD  HPI/Brief Narrative  Angela Harvey is a 67 y.o. year old female with medical history significant for  diabetes type 2, hypertension, dementia who presented on 04/20/2018 with one week of chills, SOB, and weakness, nausea, vomiting and abdominal pain and was found to have DKA with E.Coli bacteremia of unclear source..  Subjective No acute complaints.  Assessment/Plan:  1. Type 2 DM, uncontrolled. A1c > 10%.   Doing well on home Lantus20 U regimen, will need further titration as outpatient.  Continue close monitoring with scheduled mealtime (10 U with at least 50% of meals) and sliding scale.   2. DKA, resolved. Anion gap remains closed. Insulin gtt was continued for fevers and tolerated transition to subcutaneous insulin on 11/13.  3. E.coli bacteremia, stable. UA unremarkable. Renal ultrasound with no abscesses. > 48 hours without fever, previously on cefepime (as previously febrile ceftriaxone) after discussion with Dr. Graylon Good on 11/13 via phone.  Transition to oral Bactrim on 11/14 and continue for additional 5 days to complete 7 days total therapy.  Closely monitor creatinine while on Bactrim and losartan  4. Sepsis, resolved. Afebrile, WBC wnl.   5. AKI, resolved. Resumed home losartan and amlodipine, closely monitor  6. Hypertension, at goal.  Continue home losartan.    7. Dementia, currently without any behavioral disturbances.  Continue home medications, 8. Depression, stable. Home zoloft continued  Code Status: Full code  Family Communication: Husband at bedside  Disposition Plan: Social worker arranging SNF, medically ready for discharge.   Consultants:  ID (phone call)     Procedures:  None  Antimicrobials: Anti-infectives (From admission, onward)   Start     Dose/Rate Route Frequency Ordered Stop   04/24/18 1000  sulfamethoxazole-trimethoprim (BACTRIM  DS,SEPTRA DS) 800-160 MG per tablet 1 tablet     1 tablet Oral Every 12 hours 04/23/18 1438 04/29/18 0959   04/22/18 0800  ceFEPIme (MAXIPIME) 1 g in sodium chloride 0.9 % 100 mL IVPB     1 g 200 mL/hr over 30 Minutes Intravenous Every 8 hours 04/22/18 0604 04/24/18 0100   04/21/18 0800  cefTRIAXone (ROCEPHIN) 2 g in sodium chloride 0.9 % 100 mL IVPB  Status:  Discontinued     2 g 200 mL/hr over 30 Minutes Intravenous Every 24 hours 04/21/18 0746 04/22/18 0604   04/20/18 1430  vancomycin (VANCOCIN) IVPB 1000 mg/200 mL premix     1,000 mg 200 mL/hr over 60 Minutes Intravenous  Once 04/20/18 1423 04/20/18 1932   04/20/18 1430  piperacillin-tazobactam (ZOSYN) IVPB 3.375 g     3.375 g 12.5 mL/hr over 240 Minutes Intravenous  Once 04/20/18 1423 04/20/18 2235         Cultures:  Blood culture 2 out of 2, 11/10 E. coli  Telemetry:no  DVT prophylaxis: Heparin subcutaneous   Objective: Vitals:   04/23/18 1700 04/23/18 1712 04/23/18 2118 04/24/18 0428  BP:  138/64 (!) 144/71 134/68  Pulse:  (!) 110 82 91  Resp:  16 18 18   Temp: 98.9 F (37.2 C) 100.3 F (37.9 C) 99.3 F (37.4 C) 98.7 F (37.1 C)  TempSrc: Oral Oral Oral Oral  SpO2:  96% 98% 99%  Weight:      Height:        Intake/Output Summary (Last 24 hours) at 04/24/2018 1406 Last data filed at 04/24/2018 1001 Gross per 24 hour  Intake 590 ml  Output 150 ml  Net 440 ml   Filed Weights   04/20/18 1139  Weight: 111.1 kg    Exam: Constitutional:normal appearing female Eyes: EOMI, anicteric, normal conjunctivae ENMT: Oropharynx with moist mucous membranes, normal dentition Cardiovascular: RRR no MRGs, with no peripheral edema Respiratory: Normal respiratory effort on room air, clear breath sounds  Abdomen: Soft,non-tender, with no HSM Skin: No rash ulcers, or lesions. Without skin tenting  Neurologic: Grossly no focal neuro deficit. Psychiatric:Appropriate affect, and mood. Mental status AAOx3  Data  Reviewed: CBC: Recent Labs  Lab 04/20/18 1220 04/20/18 1229 04/21/18 0322  WBC 18.5*  --  11.9*  NEUTROABS 15.0*  --   --   HGB 13.3 15.0 13.0  HCT 42.6 44.0 39.4  MCV 98.6  --  92.7  PLT 340  --  546   Basic Metabolic Panel: Recent Labs  Lab 04/22/18 2248 04/23/18 0246 04/23/18 0610 04/23/18 1015 04/24/18 0817  NA 137 134* 136 133* 134*  K 3.9 4.0 3.5 3.9 4.0  CL 112* 110 112* 106 105  CO2 17* 15* 16* 16* 19*  GLUCOSE 162* 204* 145* 201* 261*  BUN 10 11 10 10 11   CREATININE 0.79 0.74 0.74 0.84 0.84  CALCIUM 9.2 9.0 9.2 9.2 8.9   GFR: Estimated Creatinine Clearance: 76.8 mL/min (by C-G formula based on SCr of 0.84 mg/dL). Liver Function Tests: No results for input(s): AST, ALT, ALKPHOS, BILITOT, PROT, ALBUMIN in the last 168 hours. No results for input(s): LIPASE, AMYLASE in the last 168 hours. No results for input(s): AMMONIA in the last 168 hours. Coagulation Profile: No results for input(s): INR, PROTIME in the last 168 hours. Cardiac Enzymes: No results for input(s): CKTOTAL, CKMB, CKMBINDEX, TROPONINI in the last 168 hours. BNP (last 3 results) No results for input(s): PROBNP in the last 8760 hours. HbA1C: No results for input(s): HGBA1C in the last 72 hours. CBG: Recent Labs  Lab 04/23/18 1556 04/23/18 1755 04/23/18 2119 04/24/18 0747 04/24/18 1157  GLUCAP 77 120* 166* 225* 374*   Lipid Profile: No results for input(s): CHOL, HDL, LDLCALC, TRIG, CHOLHDL, LDLDIRECT in the last 72 hours. Thyroid Function Tests: No results for input(s): TSH, T4TOTAL, FREET4, T3FREE, THYROIDAB in the last 72 hours. Anemia Panel: No results for input(s): VITAMINB12, FOLATE, FERRITIN, TIBC, IRON, RETICCTPCT in the last 72 hours. Urine analysis:    Component Value Date/Time   COLORURINE YELLOW 04/20/2018 1220   APPEARANCEUR HAZY (A) 04/20/2018 1220   LABSPEC 1.016 04/20/2018 1220   PHURINE 5.0 04/20/2018 1220   GLUCOSEU >=500 (A) 04/20/2018 1220   HGBUR MODERATE (A)  04/20/2018 1220   BILIRUBINUR NEGATIVE 04/20/2018 1220   KETONESUR 80 (A) 04/20/2018 1220   PROTEINUR NEGATIVE 04/20/2018 1220   UROBILINOGEN 0.2 08/08/2014 1505   NITRITE NEGATIVE 04/20/2018 1220   LEUKOCYTESUR NEGATIVE 04/20/2018 1220   Sepsis Labs: @LABRCNTIP (procalcitonin:4,lacticidven:4)  ) Recent Results (from the past 240 hour(s))  Culture, blood (routine x 2)     Status: None (Preliminary result)   Collection Time: 04/20/18  1:05 PM  Result Value Ref Range Status   Specimen Description   Final    BLOOD LEFT HAND Performed at [facility], [place] [address] [place], [place] [address]    Special Requests   Final    BOTTLES DRAWN AEROBIC AND ANAEROBIC Blood Culture adequate volume Performed at [place] [address] [place], [place] [address]    Culture  Setup Time   Final    GRAM NEGATIVE RODS AEROBIC BOTTLE  ONLY Performed at [facility], [place] [address], [place], [place] [address]    Culture GRAM NEGATIVE RODS  Final   Report Status PENDING  Incomplete  Culture, blood (routine x 2)     Status: Abnormal   Collection Time: 04/20/18  1:10 PM  Result Value Ref Range Status   Specimen Description   Final    BLOOD LEFT FOREARM Performed at [facility] [address] [place],  [place] [address]    Special Requests   Final    BOTTLES DRAWN AEROBIC AND ANAEROBIC Blood Culture adequate volume Performed at [place] [address], [place], [place] [address]    Culture  Setup Time   Final    GRAM NEGATIVE RODS AEROBIC BOTTLE ONLY CRITICAL RESULT CALLED TO, READ BACK BY AND VERIFIED WITH: Clifton Custard [id number] FCP Performed at [facility], [place] [address], [place], [place] [address]    Culture ESCHERICHIA COLI (A)  Final   Report Status 04/23/2018 FINAL  Final   Organism ID, Bacteria ESCHERICHIA COLI  Final      Susceptibility   Escherichia coli - MIC*     AMPICILLIN <=2 SENSITIVE Sensitive     CEFAZOLIN <=4 SENSITIVE Sensitive     CEFEPIME <=1 SENSITIVE Sensitive     CEFTAZIDIME <=1 SENSITIVE Sensitive     CEFTRIAXONE <=1 SENSITIVE Sensitive     CIPROFLOXACIN <=0.25 SENSITIVE Sensitive     GENTAMICIN <=1 SENSITIVE Sensitive     IMIPENEM <=0.25 SENSITIVE Sensitive     TRIMETH/SULFA <=20 SENSITIVE Sensitive     AMPICILLIN/SULBACTAM <=2 SENSITIVE Sensitive     PIP/TAZO <=4 SENSITIVE Sensitive     Extended ESBL NEGATIVE Sensitive     * ESCHERICHIA COLI  Blood Culture ID Panel (Reflexed)     Status: Abnormal   Collection Time: 04/20/18  1:10 PM  Result Value Ref Range Status   Enterococcus species NOT DETECTED NOT DETECTED Final   Listeria monocytogenes NOT DETECTED NOT DETECTED Final   Staphylococcus species NOT DETECTED NOT DETECTED Final   Staphylococcus aureus (BCID) NOT DETECTED NOT DETECTED Final   Streptococcus species NOT DETECTED NOT DETECTED Final   Streptococcus agalactiae NOT DETECTED NOT DETECTED Final   Streptococcus pneumoniae NOT DETECTED NOT DETECTED Final   Streptococcus pyogenes NOT DETECTED NOT DETECTED Final   Acinetobacter baumannii NOT DETECTED NOT DETECTED Final   Enterobacteriaceae species DETECTED (A) NOT DETECTED Final    Comment: Enterobacteriaceae represent a large family of gram-negative bacteria, not a single organism. CRITICAL RESULT CALLED TO, READ BACK BY AND VERIFIED WITH: PHARMD MEREDITH R [id number] FCP    Enterobacter cloacae complex NOT DETECTED NOT DETECTED Final   Escherichia coli DETECTED (A) NOT DETECTED Final    Comment: CRITICAL RESULT CALLED TO, READ BACK BY AND VERIFIED WITH: PHARMD MEREDITH R [id number] FCP    Klebsiella oxytoca NOT DETECTED NOT DETECTED Final   Klebsiella pneumoniae NOT DETECTED NOT DETECTED Final   Proteus species NOT DETECTED NOT DETECTED Final   Serratia marcescens NOT DETECTED NOT DETECTED Final   Carbapenem resistance NOT DETECTED NOT DETECTED Final    Haemophilus influenzae NOT DETECTED NOT DETECTED Final   Neisseria meningitidis NOT DETECTED NOT DETECTED Final   Pseudomonas aeruginosa NOT DETECTED NOT DETECTED Final   Candida albicans NOT DETECTED NOT DETECTED Final   Candida glabrata NOT DETECTED NOT DETECTED Final   Candida krusei NOT DETECTED NOT DETECTED Final   Candida parapsilosis NOT DETECTED  NOT DETECTED Final   Candida tropicalis NOT DETECTED NOT DETECTED Final    Comment: Performed at [facility], [place] [address] [place], [place] [address]  MRSA PCR Screening     Status: None   Collection Time: 04/21/18 12:44 AM  Result Value Ref Range Status   MRSA by PCR NEGATIVE NEGATIVE Final    Comment:        The GeneXpert MRSA Assay (FDA approved for NASAL specimens only), is one component of a comprehensive MRSA colonization surveillance program. It is not intended to diagnose MRSA infection nor to guide or monitor treatment for MRSA infections. Performed at [facility], [place] [address], [place], [place] [address]       Studies: No results found.  Scheduled Meds: . amLODipine  10 mg Oral Daily  . heparin  5,000 Units Subcutaneous Q8H  . insulin aspart  0-20 Units Subcutaneous TID WC  . insulin aspart  10 Units Subcutaneous TID WC  . insulin glargine  20 Units Subcutaneous q morning - 10a  . losartan  50 mg Oral Daily  . mouth rinse  15 mL Mouth Rinse BID  . memantine  28 mg Oral Daily  . pantoprazole  40 mg Oral Daily  . sertraline  100 mg Oral Daily  . sulfamethoxazole-trimethoprim  1 tablet Oral Q12H    Continuous Infusions:   LOS: 4 days     Desiree Hane, MD [facility] Pager [phone]  If 7PM-7AM, please contact night-coverage [email or web address] Password [email or web address] 04/24/2018, 2:06 PM

## 2018-04-24 NOTE — Discharge Summary (Signed)
Discharge Summary  Angela Harvey KVQ:[id number] DOB: 12/30/1950  PCP: Burnard Bunting, MD  Admit date: 04/20/2018 Discharge date: 04/25/2018   Time spent: < 25 minutes  Admitted From: home Disposition:  SNF  Recommendations for Outpatient Follow-up:  1. Follow up with PCP in 1-2 weeks. F/u BMP(creatinine, may have some elevation on bactrim) 2. Continue to check blood glucose and uptitrate lantus as needed 3. Bactrim for additional 4 days, end date 11/19    Discharge Diagnoses:  Active Hospital Problems   Diagnosis Date Noted  . DKA (diabetic ketoacidoses) ([place]) 04/20/2018  . Bacteremia due to Escherichia coli 04/23/2018  . Hyponatremia 04/20/2018  . Hyperkalemia 04/20/2018  . Dementia with behavioral disturbance ([place]) 04/20/2018  . Hypothermia 04/20/2018  . DKA, type 2 ([place]) 04/20/2018  . Renal insufficiency 07/18/2017  . Essential hypertension 08/14/2014  . Diabetes mellitus, type II ([place]) 08/14/2014    Resolved Hospital Problems  No resolved problems to display.    Discharge Condition: stable   CODE STATUS:Full   Diet recommendation:    History of present illness:  Angela Harvey is a 67 y.o. year old female with medical history significant for diabetes type 2, hypertension, dementiawho presented on 11/10/2019with one week of chills, SOB, and weakness, nausea, vomiting and abdominal painand was found to haveDKA with E.Coli bacteremia of unclear source. Remaining hospital course addressed in problem based format below:   Hospital Course:   1. Type 2 DM, uncontrolled. A1c > 10%.   Doing well on home Lantus20 U regimen, will need further titration as outpatient as fasting blood glucose is in low 200s.  Continue close monitoring with scheduled mealtime (10 U with at least 50% of meals) and sliding scale.   2. DKA, resolved.  Presented with weakness, SOB, nausea and low grade fever that likely prompted her DKA.  She was monitored in stepdown on DKA protocol.  Once anion gap closed, patient was adequately fluid resuscitated and hyponatremia was corrected, she was able to transition from insulin gtt to home dose of lantus on 64/33 with no complications   3. Sepsis seocondary to E.coli bacteremia, stable. On admission empirically treated with vancomycin and zosyn which was deescalated to ceftriaxone.  Given persistent fevers she was briefly on cefepime.  After discussion with ID, based on sensitivities was able to transition to bactrim on 11/14 and has remained afebrile since. Unclear etiology of infection as UA unremarkable. Renal ultrasound with no abscesses. Continue bactrim, with end date 11/19 ( a total of 7 days of antibiotics since defervescence)f   5. AKI, resolved. Prerenal in setting of Sepsis and bacteremia. Improved with fluid and returned to wnl.  Can continue home losartan and amlodipine, closely monitor creatinine while on bactrim  6. Hypertension, at goal.  Continue home losartan and amlodipine.    7. Dementia, currently without any behavioral disturbances.  Continue home medications, alert oriented to self, place, context, not time 8.  9. Depression, stable. Home zoloft continued   Consultations:  ID ( phone, curbside)  Procedures/Studies: none  Discharge Exam: BP 128/71 (BP Location: Right Arm)   Pulse 82   Temp 98.4 F (36.9 C) (Oral)   Resp 20   Ht 5\' 3"  (1.6 m)   Wt 111.1 kg   SpO2 96%   BMI 43.40 kg/m   General: Lying in bed, no apparent distress Eyes: EOMI, anicteric ENT: Oral Mucosa clear and moist Cardiovascular: regular rate and rhythm, no murmurs, rubs or gallops, no edema, Respiratory: Normal respiratory effort, lungs  clear to auscultation bilaterally Abdomen: soft, non-distended, non-tender, normal bowel sounds Skin: No Rash Neurologic: Grossly no focal neuro deficit.Mental status Alert, oriented to person, place, and context, speech normal, Psychiatric:Appropriate affect, and mood   Discharge  Instructions You were cared for by a hospitalist during your hospital stay. If you have any questions about your discharge medications or the care you received while you were in the hospital after you are discharged, you can call the unit and asked to speak with the hospitalist on call if the hospitalist that took care of you is not available. Once you are discharged, your primary care physician will handle any further medical issues. Please note that NO REFILLS for any discharge medications will be authorized once you are discharged, as it is imperative that you return to your primary care physician (or establish a relationship with a primary care physician if you do not have one) for your aftercare needs so that they can reassess your need for medications and monitor your lab values.  Discharge Instructions    Diet - low sodium heart healthy   Complete by:  As directed    Diet - low sodium heart healthy   Complete by:  As directed    Increase activity slowly   Complete by:  As directed    Increase activity slowly   Complete by:  As directed      Allergies as of 04/25/2018   No Known Allergies     Medication List    STOP taking these medications   celecoxib 200 MG capsule Commonly known as:  CELEBREX     TAKE these medications   acetaminophen 325 MG tablet Commonly known as:  TYLENOL Take 2 tablets (650 mg total) by mouth daily as needed (PAIN).   AFRIN ALLERGY NA Place 2 sprays into both nostrils daily as needed (ALLERGIES).   amLODipine 10 MG tablet Commonly known as:  NORVASC Take 1 tablet (10 mg total) by mouth daily.   guaiFENesin-dextromethorphan 100-10 MG/5ML syrup Commonly known as:  ROBITUSSIN DM Take 5 mLs by mouth every 4 (four) hours as needed for cough.   losartan 50 MG tablet Commonly known as:  COZAAR Take 1 tablet (50 mg total) by mouth daily.   memantine 28 MG Cp24 24 hr capsule Commonly known as:  NAMENDA XR Take 1 capsule (28 mg total) by mouth  daily.   multivitamin with minerals Tabs tablet Take 1 tablet by mouth daily.   NOVOLOG FLEXPEN 100 UNIT/ML FlexPen Generic drug:  insulin aspart Inject 10 Units into the skin 3 (three) times daily before meals.   pantoprazole 20 MG tablet Commonly known as:  PROTONIX Take 3 tablets (60 mg total) by mouth 2 (two) times daily before a meal.   sertraline 100 MG tablet Commonly known as:  ZOLOFT Take 1 tablet (100 mg total) by mouth daily.   sulfamethoxazole-trimethoprim 800-160 MG tablet Commonly known as:  BACTRIM DS,SEPTRA DS Take 1 tablet by mouth every 12 (twelve) hours for 4 days.   TRESIBA FLEXTOUCH 200 UNIT/ML Sopn Generic drug:  Insulin Degludec Inject 20 Units into the skin every morning.      No Known Allergies    The results of significant diagnostics from this hospitalization (including imaging, microbiology, ancillary and laboratory) are listed below for reference.    Significant Diagnostic Studies: US Renal  Result Date: 04/22/2018 CLINICAL DATA:  Feature, assess for stone disease or obstruction, diabetes, obesity EXAM: RENAL / URINARY TRACT ULTRASOUND COMPLETE COMPARISON:  07/18/2017  FINDINGS: Right Kidney: Renal measurements: 12.6 x 5.6 x 5.6 cm = volume: 204 mL. Mild cortical thinning. Normal echogenicity. No acute finding or hydronephrosis. Left Kidney: Renal measurements: 11.1 x 5.5 x 4.9 cm = volume: 156 mL. Similar mild cortical thinning. Normal echogenicity. No acute finding or hydronephrosis. Bladder: Appears normal for degree of bladder distention. IMPRESSION: Mild renal cortical thinning/atrophy. No other acute finding or hydronephrosis. Electronically Signed   By: Jerilynn Mages.  Shick M.D.   On: 04/22/2018 16:50   Dg Chest Portable 1 View  Result Date: 04/20/2018 CLINICAL DATA:  Weakness.  Shortness of breath. EXAM: PORTABLE CHEST 1 VIEW COMPARISON:  Chest radiograph 04/28/2016. FINDINGS: Monitoring leads overlie the patient. Normal cardiac scratch the stable  cardiac and mediastinal contours. No consolidative pulmonary opacities. IMPRESSION: No acute cardiopulmonary process. Electronically Signed   By: Lovey Newcomer M.D.   On: 04/20/2018 13:00   [place] Ekg Site Rite  Result Date: 04/20/2018 If Site Rite image not attached, placement could not be confirmed due to current cardiac rhythm.   Microbiology: Recent Results (from the past 240 hour(s))  Culture, blood (routine x 2)     Status: None (Preliminary result)   Collection Time: 04/20/18  1:05 PM  Result Value Ref Range Status   Specimen Description   Final    BLOOD LEFT HAND Performed at [facility], [place] [address], [place], [place] [address]    Special Requests   Final    BOTTLES DRAWN AEROBIC AND ANAEROBIC Blood Culture adequate volume Performed at [facility] [address], [place], [place] [address]    Culture  Setup Time   Final    GRAM NEGATIVE RODS AEROBIC BOTTLE ONLY Performed at [facility], [place] [address] [place], [place] [address]    Culture GRAM NEGATIVE RODS  Final   Report Status PENDING  Incomplete  Culture, blood (routine x 2)     Status: Abnormal   Collection Time: 04/20/18  1:10 PM  Result Value Ref Range Status   Specimen Description   Final    BLOOD LEFT FOREARM Performed at [facility] [address] [place], [place] [address]    Special Requests   Final    BOTTLES DRAWN AEROBIC AND ANAEROBIC Blood Culture adequate volume Performed at [facility] [address], [place], [place] [address]    Culture  Setup Time   Final    GRAM NEGATIVE RODS AEROBIC BOTTLE ONLY CRITICAL RESULT CALLED TO, READ BACK BY AND VERIFIED WITH: Clifton Custard [phone] FCP Performed at [facility], [place] [address] [place], [place] [address]    Culture ESCHERICHIA COLI (A)  Final   Report Status 04/23/2018 FINAL  Final   Organism ID, Bacteria ESCHERICHIA COLI  Final       Susceptibility   Escherichia coli - MIC*    AMPICILLIN <=2 SENSITIVE Sensitive     CEFAZOLIN <=4 SENSITIVE Sensitive     CEFEPIME <=1 SENSITIVE Sensitive     CEFTAZIDIME <=1 SENSITIVE Sensitive     CEFTRIAXONE <=1 SENSITIVE Sensitive     CIPROFLOXACIN <=0.25 SENSITIVE Sensitive     GENTAMICIN <=1 SENSITIVE Sensitive     IMIPENEM <=0.25 SENSITIVE Sensitive     TRIMETH/SULFA <=20 SENSITIVE Sensitive     AMPICILLIN/SULBACTAM <=2 SENSITIVE Sensitive     PIP/TAZO <=4 SENSITIVE Sensitive     Extended ESBL NEGATIVE Sensitive     * ESCHERICHIA COLI  Blood Culture ID  Panel (Reflexed)     Status: Abnormal   Collection Time: 04/20/18  1:10 PM  Result Value Ref Range Status   Enterococcus species NOT DETECTED NOT DETECTED Final   Listeria monocytogenes NOT DETECTED NOT DETECTED Final   Staphylococcus species NOT DETECTED NOT DETECTED Final   Staphylococcus aureus (BCID) NOT DETECTED NOT DETECTED Final   Streptococcus species NOT DETECTED NOT DETECTED Final   Streptococcus agalactiae NOT DETECTED NOT DETECTED Final   Streptococcus pneumoniae NOT DETECTED NOT DETECTED Final   Streptococcus pyogenes NOT DETECTED NOT DETECTED Final   Acinetobacter baumannii NOT DETECTED NOT DETECTED Final   Enterobacteriaceae species DETECTED (A) NOT DETECTED Final    Comment: Enterobacteriaceae represent a large family of gram-negative bacteria, not a single organism. CRITICAL RESULT CALLED TO, READ BACK BY AND VERIFIED WITH: PHARMD MEREDITH R [phone] [id number] FCP    Enterobacter cloacae complex NOT DETECTED NOT DETECTED Final   Escherichia coli DETECTED (A) NOT DETECTED Final    Comment: CRITICAL RESULT CALLED TO, READ BACK BY AND VERIFIED WITH: PHARMD MEREDITH R [phone] [id number] FCP    Klebsiella oxytoca NOT DETECTED NOT DETECTED Final   Klebsiella pneumoniae NOT DETECTED NOT DETECTED Final   Proteus species NOT DETECTED NOT DETECTED Final   Serratia marcescens NOT DETECTED NOT DETECTED Final   Carbapenem  resistance NOT DETECTED NOT DETECTED Final   Haemophilus influenzae NOT DETECTED NOT DETECTED Final   Neisseria meningitidis NOT DETECTED NOT DETECTED Final   Pseudomonas aeruginosa NOT DETECTED NOT DETECTED Final   Candida albicans NOT DETECTED NOT DETECTED Final   Candida glabrata NOT DETECTED NOT DETECTED Final   Candida krusei NOT DETECTED NOT DETECTED Final   Candida parapsilosis NOT DETECTED NOT DETECTED Final   Candida tropicalis NOT DETECTED NOT DETECTED Final    Comment: Performed at [facility], [place] [address], [place], [place] [address]  MRSA PCR Screening     Status: None   Collection Time: 04/21/18 12:44 AM  Result Value Ref Range Status   MRSA by PCR NEGATIVE NEGATIVE Final    Comment:        The GeneXpert MRSA Assay (FDA approved for NASAL specimens only), is one component of a comprehensive MRSA colonization surveillance program. It is not intended to diagnose MRSA infection nor to guide or monitor treatment for MRSA infections. Performed at [facility], [place] [address] [place], [place] [address]      Labs: Basic Metabolic Panel: Recent Labs  Lab 04/22/18 2248 04/23/18 0246 04/23/18 0610 04/23/18 1015 04/24/18 0817  NA 137 134* 136 133* 134*  K 3.9 4.0 3.5 3.9 4.0  CL 112* 110 112* 106 105  CO2 17* 15* 16* 16* 19*  GLUCOSE 162* 204* 145* 201* 261*  BUN 10 11 10 10 11   CREATININE 0.79 0.74 0.74 0.84 0.84  CALCIUM 9.2 9.0 9.2 9.2 8.9   Liver Function Tests: No results for input(s): AST, ALT, ALKPHOS, BILITOT, PROT, ALBUMIN in the last 168 hours. No results for input(s): LIPASE, AMYLASE in the last 168 hours. No results for input(s): AMMONIA in the last 168 hours. CBC: Recent Labs  Lab 04/20/18 1220 04/20/18 1229 04/21/18 0322  WBC 18.5*  --  11.9*  NEUTROABS 15.0*  --   --   HGB 13.3 15.0 13.0  HCT 42.6 44.0 39.4  MCV 98.6  --  92.7  PLT 340  --  225   Cardiac Enzymes: No results for input(s): CKTOTAL, CKMB,  CKMBINDEX, TROPONINI in the last  168 hours. BNP: BNP (last 3 results) No results for input(s): BNP in the last 8760 hours.  ProBNP (last 3 results) No results for input(s): PROBNP in the last 8760 hours.  CBG: Recent Labs  Lab 04/24/18 0747 04/24/18 1157 04/24/18 1708 04/24/18 2125 04/25/18 0739  GLUCAP 225* 374* 126* 79 219*       Signed:  Desiree Hane, MD [facility] 04/25/2018, 8:40 AM

## 2018-04-25 DIAGNOSIS — F419 Anxiety disorder, unspecified: Secondary | ICD-10-CM | POA: Diagnosis not present

## 2018-04-25 DIAGNOSIS — E1110 Type 2 diabetes mellitus with ketoacidosis without coma: Secondary | ICD-10-CM | POA: Diagnosis not present

## 2018-04-25 DIAGNOSIS — I10 Essential (primary) hypertension: Secondary | ICD-10-CM | POA: Diagnosis not present

## 2018-04-25 DIAGNOSIS — E1010 Type 1 diabetes mellitus with ketoacidosis without coma: Secondary | ICD-10-CM | POA: Diagnosis not present

## 2018-04-25 DIAGNOSIS — Z794 Long term (current) use of insulin: Secondary | ICD-10-CM | POA: Diagnosis not present

## 2018-04-25 DIAGNOSIS — R509 Fever, unspecified: Secondary | ICD-10-CM | POA: Diagnosis not present

## 2018-04-25 DIAGNOSIS — M6281 Muscle weakness (generalized): Secondary | ICD-10-CM | POA: Diagnosis not present

## 2018-04-25 DIAGNOSIS — R52 Pain, unspecified: Secondary | ICD-10-CM | POA: Diagnosis not present

## 2018-04-25 DIAGNOSIS — K219 Gastro-esophageal reflux disease without esophagitis: Secondary | ICD-10-CM | POA: Diagnosis not present

## 2018-04-25 DIAGNOSIS — R05 Cough: Secondary | ICD-10-CM | POA: Diagnosis not present

## 2018-04-25 DIAGNOSIS — N179 Acute kidney failure, unspecified: Secondary | ICD-10-CM | POA: Diagnosis not present

## 2018-04-25 DIAGNOSIS — R2681 Unsteadiness on feet: Secondary | ICD-10-CM | POA: Diagnosis not present

## 2018-04-25 DIAGNOSIS — E46 Unspecified protein-calorie malnutrition: Secondary | ICD-10-CM | POA: Diagnosis not present

## 2018-04-25 DIAGNOSIS — R2689 Other abnormalities of gait and mobility: Secondary | ICD-10-CM | POA: Diagnosis not present

## 2018-04-25 DIAGNOSIS — R278 Other lack of coordination: Secondary | ICD-10-CM | POA: Diagnosis not present

## 2018-04-25 DIAGNOSIS — G309 Alzheimer's disease, unspecified: Secondary | ICD-10-CM | POA: Diagnosis not present

## 2018-04-25 DIAGNOSIS — F0280 Dementia in other diseases classified elsewhere without behavioral disturbance: Secondary | ICD-10-CM | POA: Diagnosis not present

## 2018-04-25 DIAGNOSIS — N289 Disorder of kidney and ureter, unspecified: Secondary | ICD-10-CM | POA: Diagnosis not present

## 2018-04-25 DIAGNOSIS — F329 Major depressive disorder, single episode, unspecified: Secondary | ICD-10-CM | POA: Diagnosis not present

## 2018-04-25 DIAGNOSIS — Z903 Acquired absence of stomach [part of]: Secondary | ICD-10-CM | POA: Diagnosis not present

## 2018-04-25 DIAGNOSIS — J310 Chronic rhinitis: Secondary | ICD-10-CM | POA: Diagnosis not present

## 2018-04-25 DIAGNOSIS — E1165 Type 2 diabetes mellitus with hyperglycemia: Secondary | ICD-10-CM | POA: Diagnosis not present

## 2018-04-25 DIAGNOSIS — E6609 Other obesity due to excess calories: Secondary | ICD-10-CM | POA: Diagnosis not present

## 2018-04-25 DIAGNOSIS — E875 Hyperkalemia: Secondary | ICD-10-CM | POA: Diagnosis not present

## 2018-04-25 DIAGNOSIS — M1990 Unspecified osteoarthritis, unspecified site: Secondary | ICD-10-CM | POA: Diagnosis not present

## 2018-04-25 DIAGNOSIS — A4151 Sepsis due to Escherichia coli [E. coli]: Secondary | ICD-10-CM | POA: Diagnosis not present

## 2018-04-25 DIAGNOSIS — R7881 Bacteremia: Secondary | ICD-10-CM | POA: Diagnosis not present

## 2018-04-25 DIAGNOSIS — E1310 Other specified diabetes mellitus with ketoacidosis without coma: Secondary | ICD-10-CM | POA: Diagnosis not present

## 2018-04-25 LAB — CULTURE, BLOOD (ROUTINE X 2): Special Requests: ADEQUATE

## 2018-04-25 LAB — GLUCOSE, CAPILLARY
Glucose-Capillary: 219 mg/dL — ABNORMAL HIGH (ref 70–99)
Glucose-Capillary: 167 mg/dL — ABNORMAL HIGH (ref 70–99)

## 2018-04-25 MED ORDER — SULFAMETHOXAZOLE-TRIMETHOPRIM 800-160 MG PO TABS: 1.0000 | ORAL_TABLET | Freq: Two times a day (BID) | ORAL | 0 refills | Status: AC

## 2018-04-25 MED ORDER — TRESIBA FLEXTOUCH 200 UNIT/ML ~~LOC~~ SOPN: 20.0000 [IU] | PEN_INJECTOR | Freq: Every morning | SUBCUTANEOUS | 3 refills | Status: DC

## 2018-04-25 MED ORDER — NOVOLOG FLEXPEN 100 UNIT/ML ~~LOC~~ SOPN: 10.0000 [IU] | PEN_INJECTOR | Freq: Three times a day (TID) | SUBCUTANEOUS | 3 refills | Status: DC

## 2018-04-25 MED ORDER — SERTRALINE HCL 100 MG PO TABS: 100.0000 mg | ORAL_TABLET | Freq: Every day | ORAL | 3 refills | Status: DC

## 2018-04-25 MED ORDER — PANTOPRAZOLE SODIUM 20 MG PO TBEC: 60.0000 mg | DELAYED_RELEASE_TABLET | Freq: Two times a day (BID) | ORAL | 0 refills | Status: DC

## 2018-04-25 MED ORDER — LOSARTAN POTASSIUM 50 MG PO TABS: 50.0000 mg | ORAL_TABLET | Freq: Every day | ORAL | 3 refills | Status: DC

## 2018-04-25 MED ORDER — AMLODIPINE BESYLATE 10 MG PO TABS: 10.0000 mg | ORAL_TABLET | Freq: Every day | ORAL | 0 refills | Status: DC

## 2018-04-25 MED ORDER — ACETAMINOPHEN 325 MG PO TABS: 650.0000 mg | ORAL_TABLET | Freq: Every day | ORAL | Status: DC | PRN

## 2018-04-25 MED ORDER — ADULT MULTIVITAMIN W/MINERALS CH: 1.0000 | ORAL_TABLET | Freq: Every day | ORAL | Status: DC

## 2018-04-25 MED ORDER — GUAIFENESIN-DM 100-10 MG/5ML PO SYRP: 5.0000 mL | ORAL_SOLUTION | ORAL | 0 refills | Status: DC | PRN

## 2018-04-25 MED ORDER — MEMANTINE HCL ER 28 MG PO CP24: 28.0000 mg | ORAL_CAPSULE | Freq: Every day | ORAL | 5 refills | Status: DC

## 2018-04-27 DIAGNOSIS — R2689 Other abnormalities of gait and mobility: Secondary | ICD-10-CM | POA: Diagnosis not present

## 2018-04-27 DIAGNOSIS — N179 Acute kidney failure, unspecified: Secondary | ICD-10-CM | POA: Diagnosis not present

## 2018-04-27 DIAGNOSIS — A4151 Sepsis due to Escherichia coli [E. coli]: Secondary | ICD-10-CM | POA: Diagnosis not present

## 2018-04-27 DIAGNOSIS — E1010 Type 1 diabetes mellitus with ketoacidosis without coma: Secondary | ICD-10-CM | POA: Diagnosis not present

## 2018-04-27 DIAGNOSIS — E46 Unspecified protein-calorie malnutrition: Secondary | ICD-10-CM | POA: Diagnosis not present

## 2018-04-27 DIAGNOSIS — E1165 Type 2 diabetes mellitus with hyperglycemia: Secondary | ICD-10-CM | POA: Diagnosis not present

## 2018-04-27 DIAGNOSIS — I10 Essential (primary) hypertension: Secondary | ICD-10-CM | POA: Diagnosis not present

## 2018-05-10 DIAGNOSIS — F418 Other specified anxiety disorders: Secondary | ICD-10-CM | POA: Diagnosis not present

## 2018-05-10 DIAGNOSIS — Z903 Acquired absence of stomach [part of]: Secondary | ICD-10-CM | POA: Diagnosis not present

## 2018-05-10 DIAGNOSIS — G8929 Other chronic pain: Secondary | ICD-10-CM | POA: Diagnosis not present

## 2018-05-10 DIAGNOSIS — F329 Major depressive disorder, single episode, unspecified: Secondary | ICD-10-CM | POA: Diagnosis not present

## 2018-05-10 DIAGNOSIS — F0281 Dementia in other diseases classified elsewhere with behavioral disturbance: Secondary | ICD-10-CM | POA: Diagnosis not present

## 2018-05-10 DIAGNOSIS — E46 Unspecified protein-calorie malnutrition: Secondary | ICD-10-CM | POA: Diagnosis not present

## 2018-05-10 DIAGNOSIS — E6609 Other obesity due to excess calories: Secondary | ICD-10-CM | POA: Diagnosis not present

## 2018-05-10 DIAGNOSIS — I10 Essential (primary) hypertension: Secondary | ICD-10-CM | POA: Diagnosis not present

## 2018-05-10 DIAGNOSIS — G309 Alzheimer's disease, unspecified: Secondary | ICD-10-CM | POA: Diagnosis not present

## 2018-05-10 DIAGNOSIS — K219 Gastro-esophageal reflux disease without esophagitis: Secondary | ICD-10-CM | POA: Diagnosis not present

## 2018-05-10 DIAGNOSIS — F3289 Other specified depressive episodes: Secondary | ICD-10-CM | POA: Diagnosis not present

## 2018-05-10 DIAGNOSIS — Z6836 Body mass index (BMI) 36.0-36.9, adult: Secondary | ICD-10-CM | POA: Diagnosis not present

## 2018-05-10 DIAGNOSIS — M1990 Unspecified osteoarthritis, unspecified site: Secondary | ICD-10-CM | POA: Diagnosis not present

## 2018-05-10 DIAGNOSIS — Z794 Long term (current) use of insulin: Secondary | ICD-10-CM | POA: Diagnosis not present

## 2018-05-10 DIAGNOSIS — M545 Low back pain: Secondary | ICD-10-CM | POA: Diagnosis not present

## 2018-05-10 DIAGNOSIS — E1165 Type 2 diabetes mellitus with hyperglycemia: Secondary | ICD-10-CM | POA: Diagnosis not present

## 2018-05-10 DIAGNOSIS — M25552 Pain in left hip: Secondary | ICD-10-CM | POA: Diagnosis not present

## 2018-05-10 DIAGNOSIS — F419 Anxiety disorder, unspecified: Secondary | ICD-10-CM | POA: Diagnosis not present

## 2018-05-13 DIAGNOSIS — R413 Other amnesia: Secondary | ICD-10-CM | POA: Diagnosis not present

## 2018-05-13 DIAGNOSIS — N179 Acute kidney failure, unspecified: Secondary | ICD-10-CM | POA: Diagnosis not present

## 2018-05-13 DIAGNOSIS — I10 Essential (primary) hypertension: Secondary | ICD-10-CM | POA: Diagnosis not present

## 2018-05-13 DIAGNOSIS — A4151 Sepsis due to Escherichia coli [E. coli]: Secondary | ICD-10-CM | POA: Diagnosis not present

## 2018-05-13 DIAGNOSIS — Z6838 Body mass index (BMI) 38.0-38.9, adult: Secondary | ICD-10-CM | POA: Diagnosis not present

## 2018-05-13 DIAGNOSIS — E1169 Type 2 diabetes mellitus with other specified complication: Secondary | ICD-10-CM | POA: Diagnosis not present

## 2018-05-13 DIAGNOSIS — K219 Gastro-esophageal reflux disease without esophagitis: Secondary | ICD-10-CM | POA: Diagnosis not present

## 2018-05-13 DIAGNOSIS — F418 Other specified anxiety disorders: Secondary | ICD-10-CM | POA: Diagnosis not present

## 2018-05-14 DIAGNOSIS — G8929 Other chronic pain: Secondary | ICD-10-CM | POA: Diagnosis not present

## 2018-05-14 DIAGNOSIS — G309 Alzheimer's disease, unspecified: Secondary | ICD-10-CM | POA: Diagnosis not present

## 2018-05-14 DIAGNOSIS — F0281 Dementia in other diseases classified elsewhere with behavioral disturbance: Secondary | ICD-10-CM | POA: Diagnosis not present

## 2018-05-14 DIAGNOSIS — E1165 Type 2 diabetes mellitus with hyperglycemia: Secondary | ICD-10-CM | POA: Diagnosis not present

## 2018-05-14 DIAGNOSIS — M545 Low back pain: Secondary | ICD-10-CM | POA: Diagnosis not present

## 2018-05-14 DIAGNOSIS — M25552 Pain in left hip: Secondary | ICD-10-CM | POA: Diagnosis not present

## 2018-05-15 DIAGNOSIS — M545 Low back pain: Secondary | ICD-10-CM | POA: Diagnosis not present

## 2018-05-15 DIAGNOSIS — G309 Alzheimer's disease, unspecified: Secondary | ICD-10-CM | POA: Diagnosis not present

## 2018-05-15 DIAGNOSIS — G8929 Other chronic pain: Secondary | ICD-10-CM | POA: Diagnosis not present

## 2018-05-15 DIAGNOSIS — F0281 Dementia in other diseases classified elsewhere with behavioral disturbance: Secondary | ICD-10-CM | POA: Diagnosis not present

## 2018-05-15 DIAGNOSIS — M25552 Pain in left hip: Secondary | ICD-10-CM | POA: Diagnosis not present

## 2018-05-15 DIAGNOSIS — E1165 Type 2 diabetes mellitus with hyperglycemia: Secondary | ICD-10-CM | POA: Diagnosis not present

## 2018-05-15 LAB — BLOOD GAS, VENOUS
Acid-Base Excess: 3.9 mmol/L — ABNORMAL HIGH (ref 0.0–2.0)
Bicarbonate: 30.5 mmol/L — ABNORMAL HIGH (ref 20.0–28.0)
FIO2: 21
Patient temperature: 37
pCO2, Ven: 54.4 mmHg (ref 44.0–60.0)
pH, Ven: 7.367 (ref 7.250–7.430)
pO2, Ven: 60.9 mmHg — ABNORMAL HIGH (ref 32.0–45.0)

## 2018-05-16 DIAGNOSIS — E1165 Type 2 diabetes mellitus with hyperglycemia: Secondary | ICD-10-CM | POA: Diagnosis not present

## 2018-05-16 DIAGNOSIS — G309 Alzheimer's disease, unspecified: Secondary | ICD-10-CM | POA: Diagnosis not present

## 2018-05-16 DIAGNOSIS — M545 Low back pain: Secondary | ICD-10-CM | POA: Diagnosis not present

## 2018-05-16 DIAGNOSIS — G8929 Other chronic pain: Secondary | ICD-10-CM | POA: Diagnosis not present

## 2018-05-16 DIAGNOSIS — F0281 Dementia in other diseases classified elsewhere with behavioral disturbance: Secondary | ICD-10-CM | POA: Diagnosis not present

## 2018-05-16 DIAGNOSIS — M25552 Pain in left hip: Secondary | ICD-10-CM | POA: Diagnosis not present

## 2018-05-19 DIAGNOSIS — M25552 Pain in left hip: Secondary | ICD-10-CM | POA: Diagnosis not present

## 2018-05-19 DIAGNOSIS — M545 Low back pain: Secondary | ICD-10-CM | POA: Diagnosis not present

## 2018-05-19 DIAGNOSIS — G309 Alzheimer's disease, unspecified: Secondary | ICD-10-CM | POA: Diagnosis not present

## 2018-05-19 DIAGNOSIS — F0281 Dementia in other diseases classified elsewhere with behavioral disturbance: Secondary | ICD-10-CM | POA: Diagnosis not present

## 2018-05-19 DIAGNOSIS — G8929 Other chronic pain: Secondary | ICD-10-CM | POA: Diagnosis not present

## 2018-05-19 DIAGNOSIS — E1165 Type 2 diabetes mellitus with hyperglycemia: Secondary | ICD-10-CM | POA: Diagnosis not present

## 2018-05-20 DIAGNOSIS — H401131 Primary open-angle glaucoma, bilateral, mild stage: Secondary | ICD-10-CM | POA: Diagnosis not present

## 2018-05-21 DIAGNOSIS — M545 Low back pain: Secondary | ICD-10-CM | POA: Diagnosis not present

## 2018-05-21 DIAGNOSIS — G8929 Other chronic pain: Secondary | ICD-10-CM | POA: Diagnosis not present

## 2018-05-21 DIAGNOSIS — M25552 Pain in left hip: Secondary | ICD-10-CM | POA: Diagnosis not present

## 2018-05-21 DIAGNOSIS — E1165 Type 2 diabetes mellitus with hyperglycemia: Secondary | ICD-10-CM | POA: Diagnosis not present

## 2018-05-21 DIAGNOSIS — F0281 Dementia in other diseases classified elsewhere with behavioral disturbance: Secondary | ICD-10-CM | POA: Diagnosis not present

## 2018-05-21 DIAGNOSIS — G309 Alzheimer's disease, unspecified: Secondary | ICD-10-CM | POA: Diagnosis not present

## 2018-05-23 DIAGNOSIS — G8929 Other chronic pain: Secondary | ICD-10-CM | POA: Diagnosis not present

## 2018-05-23 DIAGNOSIS — M25552 Pain in left hip: Secondary | ICD-10-CM | POA: Diagnosis not present

## 2018-05-23 DIAGNOSIS — M545 Low back pain: Secondary | ICD-10-CM | POA: Diagnosis not present

## 2018-05-23 DIAGNOSIS — E1165 Type 2 diabetes mellitus with hyperglycemia: Secondary | ICD-10-CM | POA: Diagnosis not present

## 2018-05-23 DIAGNOSIS — G309 Alzheimer's disease, unspecified: Secondary | ICD-10-CM | POA: Diagnosis not present

## 2018-05-23 DIAGNOSIS — F0281 Dementia in other diseases classified elsewhere with behavioral disturbance: Secondary | ICD-10-CM | POA: Diagnosis not present

## 2018-05-23 IMAGING — CT CT ABD-PELV W/ CM
2 of 5 series · 16 of 46 positions shown, 18 images · IV contrast (iopamidol)
Comparison: Abdominal CT 04/28/2016

CLINICAL DATA: Generalized abdominal pain with nausea vomiting for
several days.

EXAM:
CT ABDOMEN AND PELVIS WITH CONTRAST
TECHNIQUE: Multidetector CT imaging of the abdomen and pelvis was performed
using the standard protocol following bolus administration of
intravenous contrast.
CONTRAST:  80mL [id number] IOPAMIDOL ([id number]) INJECTION 61%

[Series 2: axial st · axial · 0.83mm/px · z∈[-749,-374]mm · 13 of 89 slices shown, 15 images]
[im 7/89  soft-tissue]
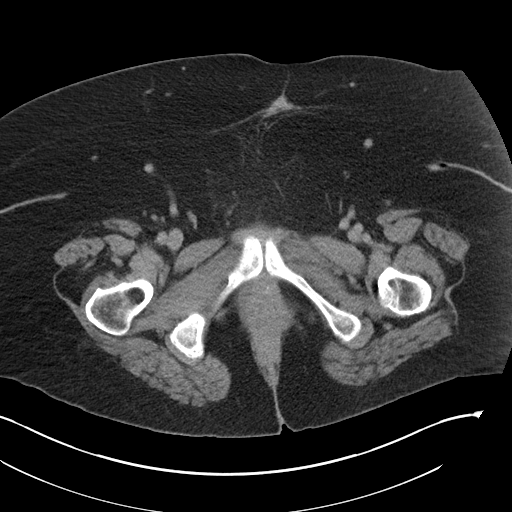
[im 7/89  bone]
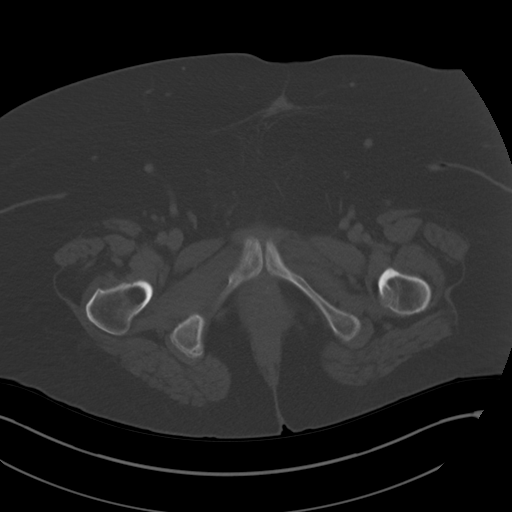
[im 13/89  soft-tissue]
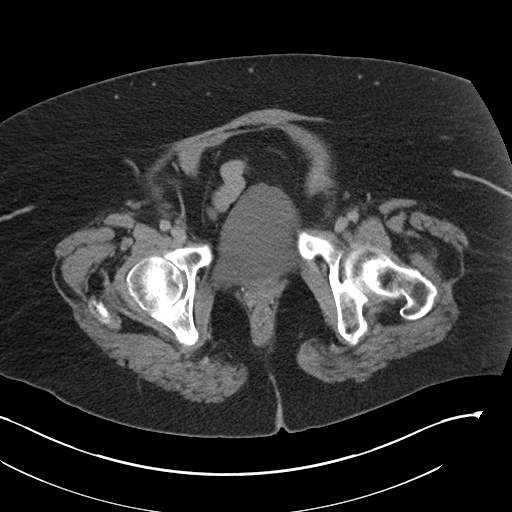
[im 19/89  soft-tissue]
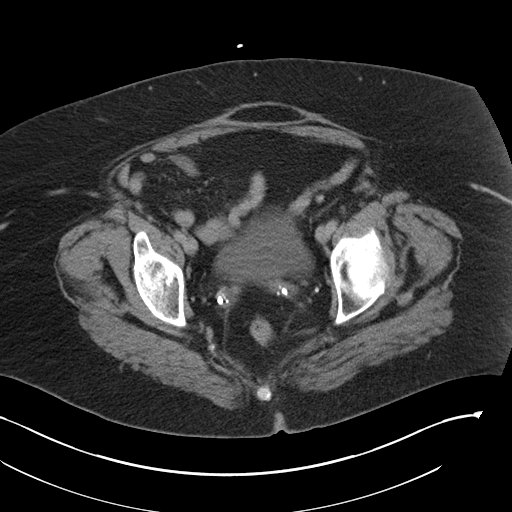
[im 26/89  soft-tissue]
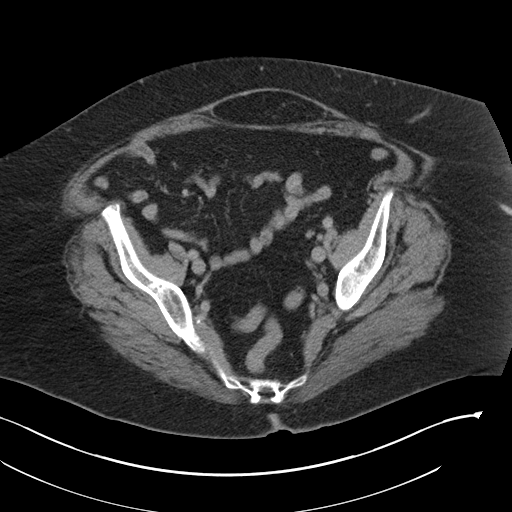
[im 32/89  soft-tissue]
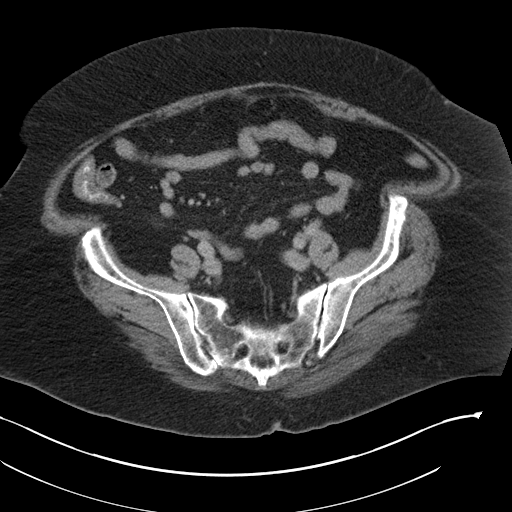
[im 38/89  soft-tissue]
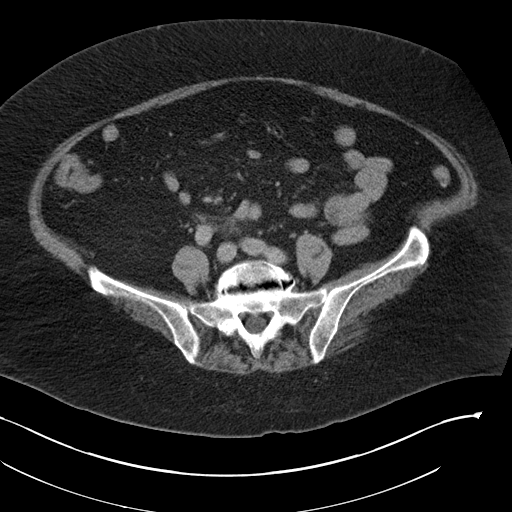
[im 45/89  soft-tissue]
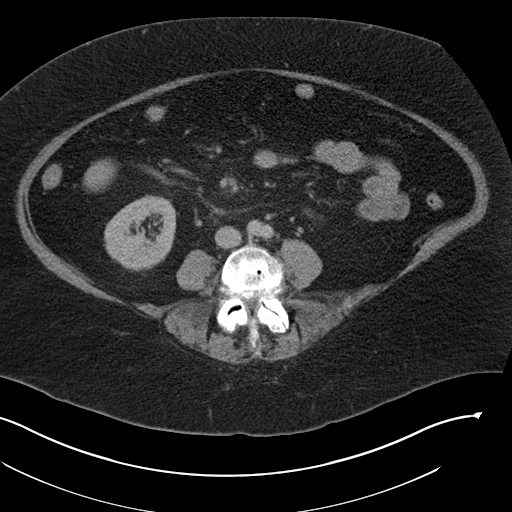
[im 51/89  soft-tissue]
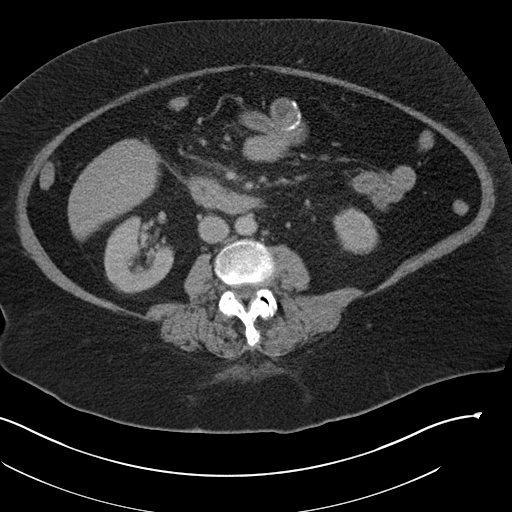
[im 57/89  soft-tissue]
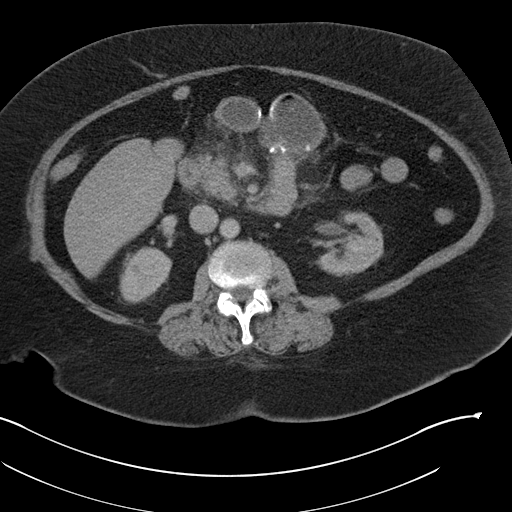
[im 57/89  bone]
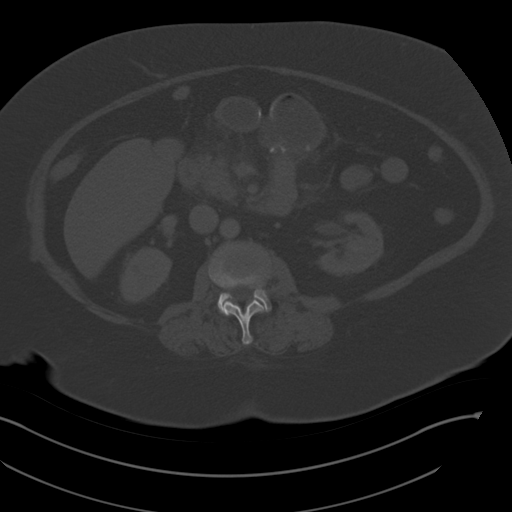
[im 63/89  soft-tissue]
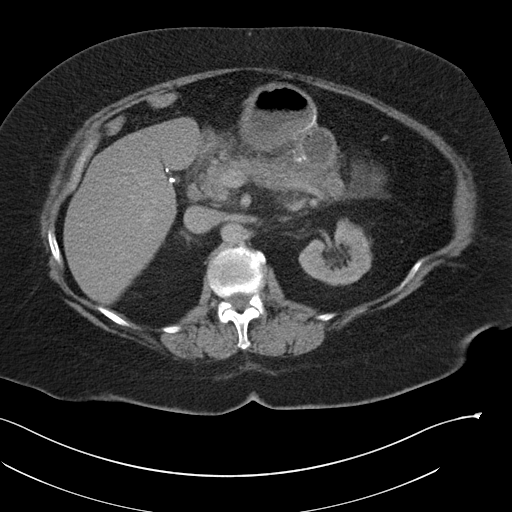
[im 70/89  soft-tissue]
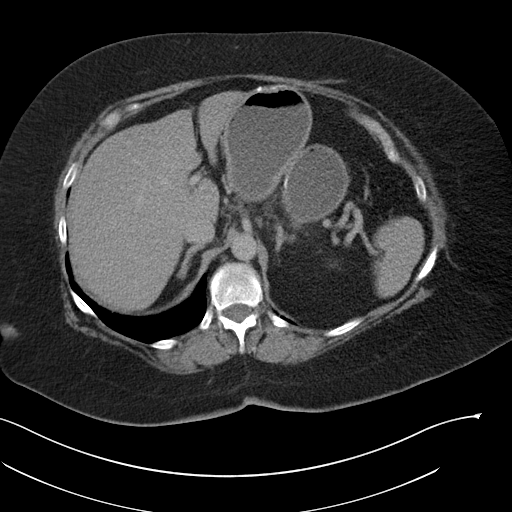
[im 76/89  soft-tissue]
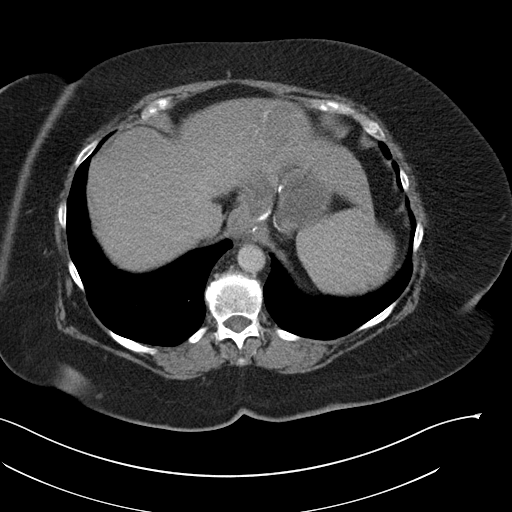
[im 82/89  soft-tissue]
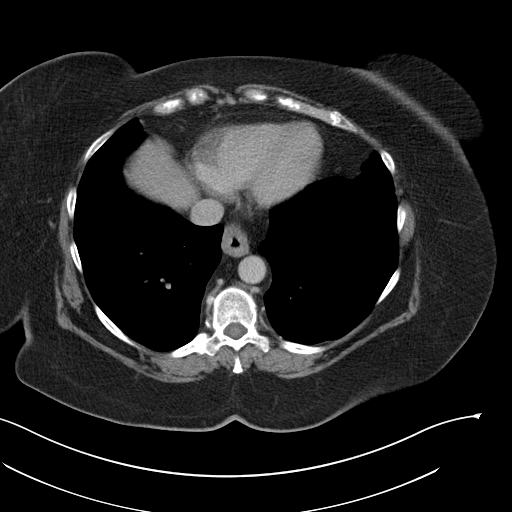

[Series 5: coronal st · coronal · 0.87mm/px · 3 of 116 slices shown]
[im 39/116  soft-tissue]
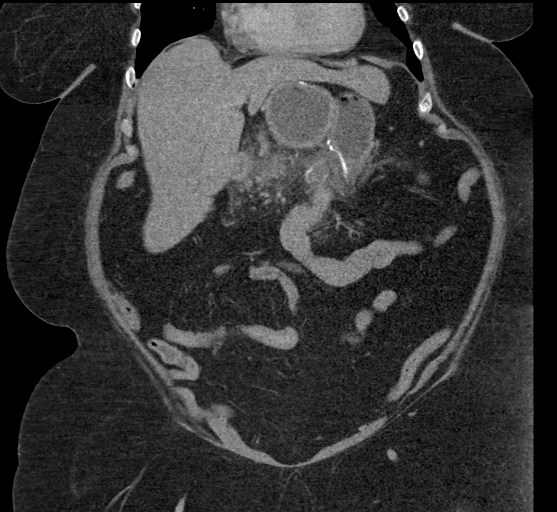
[im 52/116  soft-tissue]
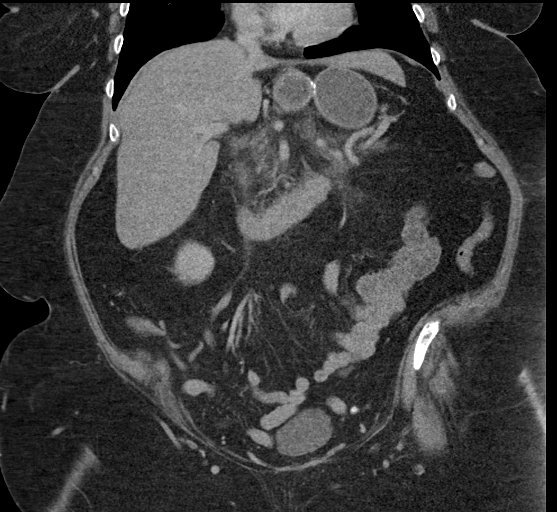
[im 64/116  soft-tissue]
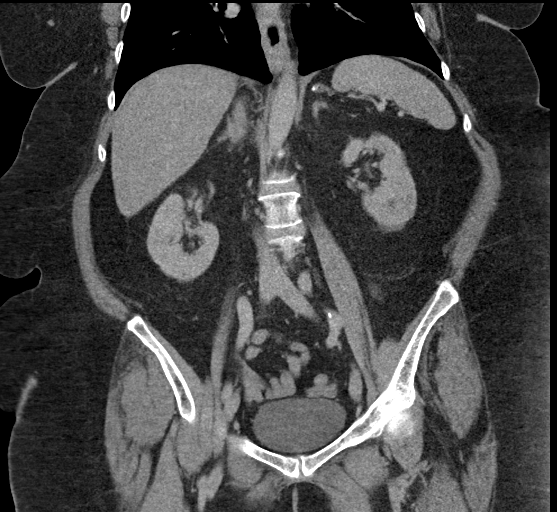

[16 of 46 positions shown; findings below may reference images not displayed]

FINDINGS: Lower chest: Small hiatal hernia.

Hepatobiliary: No focal liver abnormality is seen. Status post
cholecystectomy. No biliary dilatation.

Pancreas: Diffuse peripancreatic fat stranding and somewhat
indistinct architecture of the pancreas.

Spleen: Normal in size without focal abnormality.

Adrenals/Urinary Tract: Adrenal glands are unremarkable. Kidneys are
normal, without renal calculi, focal lesion, or hydronephrosis.
Bladder is unremarkable.

Stomach/Bowel: Stable postsurgical changes of the stomach. No
evidence of small-bowel obstruction or inflammatory changes. Normal
appearance of the colon.

Vascular/Lymphatic: No significant vascular findings are present. No
enlarged abdominal or pelvic lymph nodes.

Reproductive: Status post hysterectomy. No adnexal masses.

Other: Fat containing lower anterior abdominal wall hernia.

Musculoskeletal: Osteoarthritic changes of the lower lumbosacral
spine.
IMPRESSION: Probable acute pancreatitis. No evidence of pseudocyst formation.
Please correlate clinically.

Small hiatal hernia.

## 2018-05-27 DIAGNOSIS — I10 Essential (primary) hypertension: Secondary | ICD-10-CM | POA: Diagnosis not present

## 2018-05-27 DIAGNOSIS — R413 Other amnesia: Secondary | ICD-10-CM | POA: Diagnosis not present

## 2018-05-27 DIAGNOSIS — E1169 Type 2 diabetes mellitus with other specified complication: Secondary | ICD-10-CM | POA: Diagnosis not present

## 2018-05-27 DIAGNOSIS — Z6838 Body mass index (BMI) 38.0-38.9, adult: Secondary | ICD-10-CM | POA: Diagnosis not present

## 2018-05-28 DIAGNOSIS — M545 Low back pain: Secondary | ICD-10-CM | POA: Diagnosis not present

## 2018-05-28 DIAGNOSIS — E1165 Type 2 diabetes mellitus with hyperglycemia: Secondary | ICD-10-CM | POA: Diagnosis not present

## 2018-05-28 DIAGNOSIS — G309 Alzheimer's disease, unspecified: Secondary | ICD-10-CM | POA: Diagnosis not present

## 2018-05-28 DIAGNOSIS — G8929 Other chronic pain: Secondary | ICD-10-CM | POA: Diagnosis not present

## 2018-05-28 DIAGNOSIS — F0281 Dementia in other diseases classified elsewhere with behavioral disturbance: Secondary | ICD-10-CM | POA: Diagnosis not present

## 2018-05-28 DIAGNOSIS — M25552 Pain in left hip: Secondary | ICD-10-CM | POA: Diagnosis not present

## 2018-05-30 DIAGNOSIS — M545 Low back pain: Secondary | ICD-10-CM | POA: Diagnosis not present

## 2018-05-30 DIAGNOSIS — M25552 Pain in left hip: Secondary | ICD-10-CM | POA: Diagnosis not present

## 2018-05-30 DIAGNOSIS — G8929 Other chronic pain: Secondary | ICD-10-CM | POA: Diagnosis not present

## 2018-05-30 DIAGNOSIS — E1165 Type 2 diabetes mellitus with hyperglycemia: Secondary | ICD-10-CM | POA: Diagnosis not present

## 2018-05-30 DIAGNOSIS — G309 Alzheimer's disease, unspecified: Secondary | ICD-10-CM | POA: Diagnosis not present

## 2018-05-30 DIAGNOSIS — F0281 Dementia in other diseases classified elsewhere with behavioral disturbance: Secondary | ICD-10-CM | POA: Diagnosis not present

## 2018-06-02 DIAGNOSIS — F0281 Dementia in other diseases classified elsewhere with behavioral disturbance: Secondary | ICD-10-CM | POA: Diagnosis not present

## 2018-06-02 DIAGNOSIS — M25552 Pain in left hip: Secondary | ICD-10-CM | POA: Diagnosis not present

## 2018-06-02 DIAGNOSIS — E1165 Type 2 diabetes mellitus with hyperglycemia: Secondary | ICD-10-CM | POA: Diagnosis not present

## 2018-06-02 DIAGNOSIS — G309 Alzheimer's disease, unspecified: Secondary | ICD-10-CM | POA: Diagnosis not present

## 2018-06-02 DIAGNOSIS — M545 Low back pain: Secondary | ICD-10-CM | POA: Diagnosis not present

## 2018-06-02 DIAGNOSIS — G8929 Other chronic pain: Secondary | ICD-10-CM | POA: Diagnosis not present

## 2018-06-05 DIAGNOSIS — G309 Alzheimer's disease, unspecified: Secondary | ICD-10-CM | POA: Diagnosis not present

## 2018-06-05 DIAGNOSIS — E1165 Type 2 diabetes mellitus with hyperglycemia: Secondary | ICD-10-CM | POA: Diagnosis not present

## 2018-06-05 DIAGNOSIS — M545 Low back pain: Secondary | ICD-10-CM | POA: Diagnosis not present

## 2018-06-05 DIAGNOSIS — G8929 Other chronic pain: Secondary | ICD-10-CM | POA: Diagnosis not present

## 2018-06-05 DIAGNOSIS — M25552 Pain in left hip: Secondary | ICD-10-CM | POA: Diagnosis not present

## 2018-06-05 DIAGNOSIS — F0281 Dementia in other diseases classified elsewhere with behavioral disturbance: Secondary | ICD-10-CM | POA: Diagnosis not present

## 2018-06-09 DIAGNOSIS — G8929 Other chronic pain: Secondary | ICD-10-CM | POA: Diagnosis not present

## 2018-06-09 DIAGNOSIS — F0281 Dementia in other diseases classified elsewhere with behavioral disturbance: Secondary | ICD-10-CM | POA: Diagnosis not present

## 2018-06-09 DIAGNOSIS — M545 Low back pain: Secondary | ICD-10-CM | POA: Diagnosis not present

## 2018-06-09 DIAGNOSIS — M25552 Pain in left hip: Secondary | ICD-10-CM | POA: Diagnosis not present

## 2018-06-09 DIAGNOSIS — G309 Alzheimer's disease, unspecified: Secondary | ICD-10-CM | POA: Diagnosis not present

## 2018-06-09 DIAGNOSIS — E1165 Type 2 diabetes mellitus with hyperglycemia: Secondary | ICD-10-CM | POA: Diagnosis not present

## 2018-06-13 DIAGNOSIS — G8929 Other chronic pain: Secondary | ICD-10-CM | POA: Diagnosis not present

## 2018-06-13 DIAGNOSIS — F0281 Dementia in other diseases classified elsewhere with behavioral disturbance: Secondary | ICD-10-CM | POA: Diagnosis not present

## 2018-06-13 DIAGNOSIS — E1165 Type 2 diabetes mellitus with hyperglycemia: Secondary | ICD-10-CM | POA: Diagnosis not present

## 2018-06-13 DIAGNOSIS — G309 Alzheimer's disease, unspecified: Secondary | ICD-10-CM | POA: Diagnosis not present

## 2018-06-13 DIAGNOSIS — M545 Low back pain: Secondary | ICD-10-CM | POA: Diagnosis not present

## 2018-06-13 DIAGNOSIS — M25552 Pain in left hip: Secondary | ICD-10-CM | POA: Diagnosis not present

## 2018-06-16 DIAGNOSIS — M545 Low back pain: Secondary | ICD-10-CM | POA: Diagnosis not present

## 2018-06-16 DIAGNOSIS — E1165 Type 2 diabetes mellitus with hyperglycemia: Secondary | ICD-10-CM | POA: Diagnosis not present

## 2018-06-16 DIAGNOSIS — M25552 Pain in left hip: Secondary | ICD-10-CM | POA: Diagnosis not present

## 2018-06-16 DIAGNOSIS — G8929 Other chronic pain: Secondary | ICD-10-CM | POA: Diagnosis not present

## 2018-06-16 DIAGNOSIS — F0281 Dementia in other diseases classified elsewhere with behavioral disturbance: Secondary | ICD-10-CM | POA: Diagnosis not present

## 2018-06-16 DIAGNOSIS — G309 Alzheimer's disease, unspecified: Secondary | ICD-10-CM | POA: Diagnosis not present

## 2018-06-18 DIAGNOSIS — F0281 Dementia in other diseases classified elsewhere with behavioral disturbance: Secondary | ICD-10-CM | POA: Diagnosis not present

## 2018-06-18 DIAGNOSIS — M545 Low back pain: Secondary | ICD-10-CM | POA: Diagnosis not present

## 2018-06-18 DIAGNOSIS — M25552 Pain in left hip: Secondary | ICD-10-CM | POA: Diagnosis not present

## 2018-06-18 DIAGNOSIS — G8929 Other chronic pain: Secondary | ICD-10-CM | POA: Diagnosis not present

## 2018-06-18 DIAGNOSIS — G309 Alzheimer's disease, unspecified: Secondary | ICD-10-CM | POA: Diagnosis not present

## 2018-06-18 DIAGNOSIS — E1165 Type 2 diabetes mellitus with hyperglycemia: Secondary | ICD-10-CM | POA: Diagnosis not present

## 2018-06-23 DIAGNOSIS — F0281 Dementia in other diseases classified elsewhere with behavioral disturbance: Secondary | ICD-10-CM | POA: Diagnosis not present

## 2018-06-23 DIAGNOSIS — G309 Alzheimer's disease, unspecified: Secondary | ICD-10-CM | POA: Diagnosis not present

## 2018-06-23 DIAGNOSIS — G8929 Other chronic pain: Secondary | ICD-10-CM | POA: Diagnosis not present

## 2018-06-23 DIAGNOSIS — E1165 Type 2 diabetes mellitus with hyperglycemia: Secondary | ICD-10-CM | POA: Diagnosis not present

## 2018-06-23 DIAGNOSIS — M545 Low back pain: Secondary | ICD-10-CM | POA: Diagnosis not present

## 2018-06-23 DIAGNOSIS — M25552 Pain in left hip: Secondary | ICD-10-CM | POA: Diagnosis not present

## 2018-06-25 DIAGNOSIS — M545 Low back pain: Secondary | ICD-10-CM | POA: Diagnosis not present

## 2018-06-25 DIAGNOSIS — G309 Alzheimer's disease, unspecified: Secondary | ICD-10-CM | POA: Diagnosis not present

## 2018-06-25 DIAGNOSIS — E1165 Type 2 diabetes mellitus with hyperglycemia: Secondary | ICD-10-CM | POA: Diagnosis not present

## 2018-06-25 DIAGNOSIS — M25552 Pain in left hip: Secondary | ICD-10-CM | POA: Diagnosis not present

## 2018-06-25 DIAGNOSIS — G8929 Other chronic pain: Secondary | ICD-10-CM | POA: Diagnosis not present

## 2018-06-25 DIAGNOSIS — F0281 Dementia in other diseases classified elsewhere with behavioral disturbance: Secondary | ICD-10-CM | POA: Diagnosis not present

## 2018-06-30 DIAGNOSIS — G8929 Other chronic pain: Secondary | ICD-10-CM | POA: Diagnosis not present

## 2018-06-30 DIAGNOSIS — M545 Low back pain: Secondary | ICD-10-CM | POA: Diagnosis not present

## 2018-06-30 DIAGNOSIS — E1165 Type 2 diabetes mellitus with hyperglycemia: Secondary | ICD-10-CM | POA: Diagnosis not present

## 2018-06-30 DIAGNOSIS — G309 Alzheimer's disease, unspecified: Secondary | ICD-10-CM | POA: Diagnosis not present

## 2018-06-30 DIAGNOSIS — M25552 Pain in left hip: Secondary | ICD-10-CM | POA: Diagnosis not present

## 2018-06-30 DIAGNOSIS — F0281 Dementia in other diseases classified elsewhere with behavioral disturbance: Secondary | ICD-10-CM | POA: Diagnosis not present

## 2018-07-01 DIAGNOSIS — I10 Essential (primary) hypertension: Secondary | ICD-10-CM | POA: Diagnosis not present

## 2018-07-01 DIAGNOSIS — E1169 Type 2 diabetes mellitus with other specified complication: Secondary | ICD-10-CM | POA: Diagnosis not present

## 2018-07-01 DIAGNOSIS — Z794 Long term (current) use of insulin: Secondary | ICD-10-CM | POA: Diagnosis not present

## 2018-07-04 DIAGNOSIS — G309 Alzheimer's disease, unspecified: Secondary | ICD-10-CM | POA: Diagnosis not present

## 2018-07-04 DIAGNOSIS — M545 Low back pain: Secondary | ICD-10-CM | POA: Diagnosis not present

## 2018-07-04 DIAGNOSIS — M25552 Pain in left hip: Secondary | ICD-10-CM | POA: Diagnosis not present

## 2018-07-04 DIAGNOSIS — F0281 Dementia in other diseases classified elsewhere with behavioral disturbance: Secondary | ICD-10-CM | POA: Diagnosis not present

## 2018-07-04 DIAGNOSIS — E1165 Type 2 diabetes mellitus with hyperglycemia: Secondary | ICD-10-CM | POA: Diagnosis not present

## 2018-07-04 DIAGNOSIS — G8929 Other chronic pain: Secondary | ICD-10-CM | POA: Diagnosis not present

## 2018-08-21 DIAGNOSIS — R413 Other amnesia: Secondary | ICD-10-CM | POA: Diagnosis not present

## 2018-08-21 DIAGNOSIS — E1169 Type 2 diabetes mellitus with other specified complication: Secondary | ICD-10-CM | POA: Diagnosis not present

## 2018-08-21 DIAGNOSIS — Z794 Long term (current) use of insulin: Secondary | ICD-10-CM | POA: Diagnosis not present

## 2018-08-21 DIAGNOSIS — F418 Other specified anxiety disorders: Secondary | ICD-10-CM | POA: Diagnosis not present

## 2018-08-21 DIAGNOSIS — K219 Gastro-esophageal reflux disease without esophagitis: Secondary | ICD-10-CM | POA: Diagnosis not present

## 2018-08-21 DIAGNOSIS — I10 Essential (primary) hypertension: Secondary | ICD-10-CM | POA: Diagnosis not present

## 2018-08-21 DIAGNOSIS — J309 Allergic rhinitis, unspecified: Secondary | ICD-10-CM | POA: Diagnosis not present

## 2018-08-21 DIAGNOSIS — M5416 Radiculopathy, lumbar region: Secondary | ICD-10-CM | POA: Diagnosis not present

## 2018-08-21 DIAGNOSIS — M1990 Unspecified osteoarthritis, unspecified site: Secondary | ICD-10-CM | POA: Diagnosis not present

## 2018-08-21 DIAGNOSIS — N179 Acute kidney failure, unspecified: Secondary | ICD-10-CM | POA: Diagnosis not present

## 2018-10-02 DIAGNOSIS — E1169 Type 2 diabetes mellitus with other specified complication: Secondary | ICD-10-CM | POA: Diagnosis not present

## 2018-10-02 DIAGNOSIS — I10 Essential (primary) hypertension: Secondary | ICD-10-CM | POA: Diagnosis not present

## 2018-10-02 DIAGNOSIS — Z794 Long term (current) use of insulin: Secondary | ICD-10-CM | POA: Diagnosis not present

## 2018-10-02 DIAGNOSIS — R413 Other amnesia: Secondary | ICD-10-CM | POA: Diagnosis not present

## 2018-11-14 DIAGNOSIS — H401131 Primary open-angle glaucoma, bilateral, mild stage: Secondary | ICD-10-CM | POA: Diagnosis not present

## 2018-12-30 DIAGNOSIS — Z794 Long term (current) use of insulin: Secondary | ICD-10-CM | POA: Diagnosis not present

## 2018-12-30 DIAGNOSIS — K219 Gastro-esophageal reflux disease without esophagitis: Secondary | ICD-10-CM | POA: Diagnosis not present

## 2018-12-30 DIAGNOSIS — I10 Essential (primary) hypertension: Secondary | ICD-10-CM | POA: Diagnosis not present

## 2018-12-30 DIAGNOSIS — E1169 Type 2 diabetes mellitus with other specified complication: Secondary | ICD-10-CM | POA: Diagnosis not present

## 2018-12-30 DIAGNOSIS — R413 Other amnesia: Secondary | ICD-10-CM | POA: Diagnosis not present

## 2019-01-16 DIAGNOSIS — E1169 Type 2 diabetes mellitus with other specified complication: Secondary | ICD-10-CM | POA: Diagnosis not present

## 2019-01-21 DIAGNOSIS — Z1339 Encounter for screening examination for other mental health and behavioral disorders: Secondary | ICD-10-CM | POA: Diagnosis not present

## 2019-01-21 DIAGNOSIS — E669 Obesity, unspecified: Secondary | ICD-10-CM | POA: Diagnosis not present

## 2019-01-21 DIAGNOSIS — M1990 Unspecified osteoarthritis, unspecified site: Secondary | ICD-10-CM | POA: Diagnosis not present

## 2019-01-21 DIAGNOSIS — I10 Essential (primary) hypertension: Secondary | ICD-10-CM | POA: Diagnosis not present

## 2019-01-21 DIAGNOSIS — F418 Other specified anxiety disorders: Secondary | ICD-10-CM | POA: Diagnosis not present

## 2019-01-21 DIAGNOSIS — Z0000 Encounter for general adult medical examination without abnormal findings: Secondary | ICD-10-CM | POA: Diagnosis not present

## 2019-01-21 DIAGNOSIS — N179 Acute kidney failure, unspecified: Secondary | ICD-10-CM | POA: Diagnosis not present

## 2019-01-21 DIAGNOSIS — K219 Gastro-esophageal reflux disease without esophagitis: Secondary | ICD-10-CM | POA: Diagnosis not present

## 2019-01-21 DIAGNOSIS — M5416 Radiculopathy, lumbar region: Secondary | ICD-10-CM | POA: Diagnosis not present

## 2019-01-21 DIAGNOSIS — J309 Allergic rhinitis, unspecified: Secondary | ICD-10-CM | POA: Diagnosis not present

## 2019-01-21 DIAGNOSIS — Z1331 Encounter for screening for depression: Secondary | ICD-10-CM | POA: Diagnosis not present

## 2019-01-21 DIAGNOSIS — E1169 Type 2 diabetes mellitus with other specified complication: Secondary | ICD-10-CM | POA: Diagnosis not present

## 2019-01-21 DIAGNOSIS — R413 Other amnesia: Secondary | ICD-10-CM | POA: Diagnosis not present

## 2019-03-17 DIAGNOSIS — H524 Presbyopia: Secondary | ICD-10-CM | POA: Diagnosis not present

## 2019-03-17 DIAGNOSIS — E119 Type 2 diabetes mellitus without complications: Secondary | ICD-10-CM | POA: Diagnosis not present

## 2019-03-17 DIAGNOSIS — Z23 Encounter for immunization: Secondary | ICD-10-CM | POA: Diagnosis not present

## 2019-03-17 DIAGNOSIS — H2513 Age-related nuclear cataract, bilateral: Secondary | ICD-10-CM | POA: Diagnosis not present

## 2019-03-17 DIAGNOSIS — H401131 Primary open-angle glaucoma, bilateral, mild stage: Secondary | ICD-10-CM | POA: Diagnosis not present

## 2019-06-18 DIAGNOSIS — K219 Gastro-esophageal reflux disease without esophagitis: Secondary | ICD-10-CM | POA: Diagnosis not present

## 2019-06-18 DIAGNOSIS — M1990 Unspecified osteoarthritis, unspecified site: Secondary | ICD-10-CM | POA: Diagnosis not present

## 2019-06-18 DIAGNOSIS — Z1331 Encounter for screening for depression: Secondary | ICD-10-CM | POA: Diagnosis not present

## 2019-06-18 DIAGNOSIS — R413 Other amnesia: Secondary | ICD-10-CM | POA: Diagnosis not present

## 2019-06-18 DIAGNOSIS — I10 Essential (primary) hypertension: Secondary | ICD-10-CM | POA: Diagnosis not present

## 2019-06-18 DIAGNOSIS — E1169 Type 2 diabetes mellitus with other specified complication: Secondary | ICD-10-CM | POA: Diagnosis not present

## 2019-06-18 DIAGNOSIS — Z794 Long term (current) use of insulin: Secondary | ICD-10-CM | POA: Diagnosis not present

## 2019-07-20 DIAGNOSIS — E1169 Type 2 diabetes mellitus with other specified complication: Secondary | ICD-10-CM | POA: Diagnosis not present

## 2019-07-20 DIAGNOSIS — H401131 Primary open-angle glaucoma, bilateral, mild stage: Secondary | ICD-10-CM | POA: Diagnosis not present

## 2019-08-02 ENCOUNTER — Ambulatory Visit: Payer: Medicare Other | Attending: Internal Medicine

## 2019-08-02 DIAGNOSIS — Z23 Encounter for immunization: Secondary | ICD-10-CM | POA: Insufficient documentation

## 2019-08-02 NOTE — Progress Notes (Signed)
   Covid-19 Vaccination Clinic  Name:  Angela Harvey    MRN: [id number] DOB: 08-26-1950  08/02/2019  [name] was observed post Covid-19 immunization for 15 minutes without incidence. She was provided with Vaccine Information Sheet and instruction to access the V-Safe system.   [name] was instructed to call 911 with any severe reactions post vaccine: [place] Kitchen Difficulty breathing  . Swelling of your face and throat  . A fast heartbeat  . A bad rash all over your body  . Dizziness and weakness    Immunizations Administered    Name Date Dose VIS Date Route   Pfizer COVID-19 Vaccine 08/02/2019  9:10 AM 0.3 mL 05/22/2019 Intramuscular   Manufacturer: [place]   Lot: [id number]   [place]: [id number]

## 2019-08-25 ENCOUNTER — Ambulatory Visit: Payer: Medicare Other | Attending: Internal Medicine

## 2019-08-25 DIAGNOSIS — Z23 Encounter for immunization: Secondary | ICD-10-CM

## 2019-08-25 NOTE — Progress Notes (Signed)
   Covid-19 Vaccination Clinic  Name:  Angela Harvey    MRN: [id number] DOB: [birth year]  08/25/2019  [name] was observed post Covid-19 immunization for 15 minutes without incident. She was provided with Vaccine Information Sheet and instruction to access the V-Safe system.   [name] was instructed to call 911 with any severe reactions post vaccine: [place] Kitchen Difficulty breathing  . Swelling of face and throat  . A fast heartbeat  . A bad rash all over body  . Dizziness and weakness   Immunizations Administered    Name Date Dose VIS Date Route   Pfizer COVID-19 Vaccine 08/25/2019  9:59 AM 0.3 mL 05/22/2019 Intramuscular   Manufacturer: [place]   Lot: [id number]   [place]: [id number]

## 2019-09-30 DIAGNOSIS — E1169 Type 2 diabetes mellitus with other specified complication: Secondary | ICD-10-CM | POA: Diagnosis not present

## 2019-09-30 DIAGNOSIS — I10 Essential (primary) hypertension: Secondary | ICD-10-CM | POA: Diagnosis not present

## 2019-09-30 DIAGNOSIS — Z794 Long term (current) use of insulin: Secondary | ICD-10-CM | POA: Diagnosis not present

## 2019-09-30 DIAGNOSIS — B372 Candidiasis of skin and nail: Secondary | ICD-10-CM | POA: Diagnosis not present

## 2020-01-25 DIAGNOSIS — E1165 Type 2 diabetes mellitus with hyperglycemia: Secondary | ICD-10-CM | POA: Diagnosis not present

## 2020-01-25 DIAGNOSIS — R413 Other amnesia: Secondary | ICD-10-CM | POA: Diagnosis not present

## 2020-01-25 DIAGNOSIS — I10 Essential (primary) hypertension: Secondary | ICD-10-CM | POA: Diagnosis not present

## 2020-01-25 DIAGNOSIS — M1990 Unspecified osteoarthritis, unspecified site: Secondary | ICD-10-CM | POA: Diagnosis not present

## 2020-03-02 DIAGNOSIS — I10 Essential (primary) hypertension: Secondary | ICD-10-CM | POA: Diagnosis not present

## 2020-03-02 DIAGNOSIS — Z794 Long term (current) use of insulin: Secondary | ICD-10-CM | POA: Diagnosis not present

## 2020-03-02 DIAGNOSIS — Z23 Encounter for immunization: Secondary | ICD-10-CM | POA: Diagnosis not present

## 2020-03-02 DIAGNOSIS — E1165 Type 2 diabetes mellitus with hyperglycemia: Secondary | ICD-10-CM | POA: Diagnosis not present

## 2020-03-19 DIAGNOSIS — Z23 Encounter for immunization: Secondary | ICD-10-CM | POA: Diagnosis not present

## 2020-04-20 DIAGNOSIS — E1165 Type 2 diabetes mellitus with hyperglycemia: Secondary | ICD-10-CM | POA: Diagnosis not present

## 2020-04-20 DIAGNOSIS — I10 Essential (primary) hypertension: Secondary | ICD-10-CM | POA: Diagnosis not present

## 2020-04-27 DIAGNOSIS — I10 Essential (primary) hypertension: Secondary | ICD-10-CM | POA: Diagnosis not present

## 2020-04-27 DIAGNOSIS — Z1211 Encounter for screening for malignant neoplasm of colon: Secondary | ICD-10-CM | POA: Diagnosis not present

## 2020-04-27 DIAGNOSIS — Z1231 Encounter for screening mammogram for malignant neoplasm of breast: Secondary | ICD-10-CM | POA: Diagnosis not present

## 2020-04-27 DIAGNOSIS — Z794 Long term (current) use of insulin: Secondary | ICD-10-CM | POA: Diagnosis not present

## 2020-04-27 DIAGNOSIS — E1165 Type 2 diabetes mellitus with hyperglycemia: Secondary | ICD-10-CM | POA: Diagnosis not present

## 2020-04-27 DIAGNOSIS — R413 Other amnesia: Secondary | ICD-10-CM | POA: Diagnosis not present

## 2020-04-27 DIAGNOSIS — K219 Gastro-esophageal reflux disease without esophagitis: Secondary | ICD-10-CM | POA: Diagnosis not present

## 2020-04-27 DIAGNOSIS — Z0000 Encounter for general adult medical examination without abnormal findings: Secondary | ICD-10-CM | POA: Diagnosis not present

## 2020-05-13 ENCOUNTER — Other Ambulatory Visit: Payer: Self-pay | Admitting: Internal Medicine

## 2020-05-13 DIAGNOSIS — Z1231 Encounter for screening mammogram for malignant neoplasm of breast: Secondary | ICD-10-CM

## 2020-06-08 DIAGNOSIS — I10 Essential (primary) hypertension: Secondary | ICD-10-CM | POA: Diagnosis not present

## 2020-06-08 DIAGNOSIS — B372 Candidiasis of skin and nail: Secondary | ICD-10-CM | POA: Diagnosis not present

## 2020-06-08 DIAGNOSIS — Z794 Long term (current) use of insulin: Secondary | ICD-10-CM | POA: Diagnosis not present

## 2020-06-08 DIAGNOSIS — E1165 Type 2 diabetes mellitus with hyperglycemia: Secondary | ICD-10-CM | POA: Diagnosis not present

## 2020-06-24 ENCOUNTER — Ambulatory Visit: Payer: Medicare Other

## 2020-08-01 DIAGNOSIS — E1165 Type 2 diabetes mellitus with hyperglycemia: Secondary | ICD-10-CM | POA: Diagnosis not present

## 2020-08-01 DIAGNOSIS — R4189 Other symptoms and signs involving cognitive functions and awareness: Secondary | ICD-10-CM | POA: Diagnosis not present

## 2020-08-01 DIAGNOSIS — N179 Acute kidney failure, unspecified: Secondary | ICD-10-CM | POA: Diagnosis not present

## 2020-08-01 DIAGNOSIS — Z794 Long term (current) use of insulin: Secondary | ICD-10-CM | POA: Diagnosis not present

## 2020-08-01 DIAGNOSIS — I10 Essential (primary) hypertension: Secondary | ICD-10-CM | POA: Diagnosis not present

## 2020-08-01 DIAGNOSIS — M1990 Unspecified osteoarthritis, unspecified site: Secondary | ICD-10-CM | POA: Diagnosis not present

## 2020-08-29 DIAGNOSIS — E119 Type 2 diabetes mellitus without complications: Secondary | ICD-10-CM | POA: Diagnosis not present

## 2020-08-29 DIAGNOSIS — H524 Presbyopia: Secondary | ICD-10-CM | POA: Diagnosis not present

## 2020-08-29 DIAGNOSIS — H2513 Age-related nuclear cataract, bilateral: Secondary | ICD-10-CM | POA: Diagnosis not present

## 2020-08-29 DIAGNOSIS — H401122 Primary open-angle glaucoma, left eye, moderate stage: Secondary | ICD-10-CM | POA: Diagnosis not present

## 2020-09-13 DIAGNOSIS — H401111 Primary open-angle glaucoma, right eye, mild stage: Secondary | ICD-10-CM | POA: Diagnosis not present

## 2020-09-13 DIAGNOSIS — E1165 Type 2 diabetes mellitus with hyperglycemia: Secondary | ICD-10-CM | POA: Diagnosis not present

## 2020-09-13 DIAGNOSIS — Z794 Long term (current) use of insulin: Secondary | ICD-10-CM | POA: Diagnosis not present

## 2020-09-13 DIAGNOSIS — I10 Essential (primary) hypertension: Secondary | ICD-10-CM | POA: Diagnosis not present

## 2020-09-30 DIAGNOSIS — Z794 Long term (current) use of insulin: Secondary | ICD-10-CM | POA: Diagnosis not present

## 2020-09-30 DIAGNOSIS — R4189 Other symptoms and signs involving cognitive functions and awareness: Secondary | ICD-10-CM | POA: Diagnosis not present

## 2020-09-30 DIAGNOSIS — I10 Essential (primary) hypertension: Secondary | ICD-10-CM | POA: Diagnosis not present

## 2020-09-30 DIAGNOSIS — E1165 Type 2 diabetes mellitus with hyperglycemia: Secondary | ICD-10-CM | POA: Diagnosis not present

## 2020-10-12 ENCOUNTER — Inpatient Hospital Stay (HOSPITAL_COMMUNITY)
Admission: EM | Admit: 2020-10-12 | Discharge: 2020-10-15 | DRG: 638 | Disposition: A | Discharge status: Home / Self Care | Payer: Medicare Other | Attending: Internal Medicine | Admitting: Internal Medicine

## 2020-10-12 ENCOUNTER — Other Ambulatory Visit: Payer: Self-pay

## 2020-10-12 DIAGNOSIS — R1110 Vomiting, unspecified: Secondary | ICD-10-CM | POA: Diagnosis not present

## 2020-10-12 DIAGNOSIS — E162 Hypoglycemia, unspecified: Secondary | ICD-10-CM

## 2020-10-12 DIAGNOSIS — Z85028 Personal history of other malignant neoplasm of stomach: Secondary | ICD-10-CM

## 2020-10-12 DIAGNOSIS — N390 Urinary tract infection, site not specified: Secondary | ICD-10-CM | POA: Diagnosis present

## 2020-10-12 DIAGNOSIS — F0391 Unspecified dementia with behavioral disturbance: Secondary | ICD-10-CM | POA: Diagnosis not present

## 2020-10-12 DIAGNOSIS — K219 Gastro-esophageal reflux disease without esophagitis: Secondary | ICD-10-CM | POA: Diagnosis present

## 2020-10-12 DIAGNOSIS — Z6841 Body Mass Index (BMI) 40.0 and over, adult: Secondary | ICD-10-CM

## 2020-10-12 DIAGNOSIS — I708 Atherosclerosis of other arteries: Secondary | ICD-10-CM | POA: Diagnosis not present

## 2020-10-12 DIAGNOSIS — E119 Type 2 diabetes mellitus without complications: Secondary | ICD-10-CM

## 2020-10-12 DIAGNOSIS — I10 Essential (primary) hypertension: Secondary | ICD-10-CM | POA: Diagnosis present

## 2020-10-12 DIAGNOSIS — E11649 Type 2 diabetes mellitus with hypoglycemia without coma: Secondary | ICD-10-CM | POA: Diagnosis not present

## 2020-10-12 DIAGNOSIS — I672 Cerebral atherosclerosis: Secondary | ICD-10-CM | POA: Diagnosis not present

## 2020-10-12 DIAGNOSIS — E669 Obesity, unspecified: Secondary | ICD-10-CM | POA: Diagnosis present

## 2020-10-12 DIAGNOSIS — R4781 Slurred speech: Secondary | ICD-10-CM | POA: Diagnosis not present

## 2020-10-12 DIAGNOSIS — R410 Disorientation, unspecified: Secondary | ICD-10-CM | POA: Diagnosis not present

## 2020-10-12 DIAGNOSIS — E161 Other hypoglycemia: Secondary | ICD-10-CM | POA: Diagnosis not present

## 2020-10-12 DIAGNOSIS — R404 Transient alteration of awareness: Secondary | ICD-10-CM | POA: Diagnosis not present

## 2020-10-12 DIAGNOSIS — Z794 Long term (current) use of insulin: Secondary | ICD-10-CM

## 2020-10-12 DIAGNOSIS — R112 Nausea with vomiting, unspecified: Secondary | ICD-10-CM

## 2020-10-12 DIAGNOSIS — B9620 Unspecified Escherichia coli [E. coli] as the cause of diseases classified elsewhere: Secondary | ICD-10-CM | POA: Diagnosis present

## 2020-10-12 DIAGNOSIS — F03918 Unspecified dementia, unspecified severity, with other behavioral disturbance: Secondary | ICD-10-CM | POA: Diagnosis present

## 2020-10-12 DIAGNOSIS — Z79899 Other long term (current) drug therapy: Secondary | ICD-10-CM

## 2020-10-12 DIAGNOSIS — Z20822 Contact with and (suspected) exposure to covid-19: Secondary | ICD-10-CM | POA: Diagnosis not present

## 2020-10-12 DIAGNOSIS — G319 Degenerative disease of nervous system, unspecified: Secondary | ICD-10-CM | POA: Diagnosis not present

## 2020-10-12 LAB — BASIC METABOLIC PANEL
Anion gap: 7 (ref 5–15)
BUN: 18 mg/dL (ref 8–23)
CO2: 22 mmol/L (ref 22–32)
Calcium: 8.5 mg/dL — ABNORMAL LOW (ref 8.9–10.3)
Chloride: 111 mmol/L (ref 98–111)
Creatinine, Ser: 0.74 mg/dL (ref 0.44–1.00)
GFR, Estimated: >60 mL/min (ref >60)
Glucose, Bld: 158 mg/dL — ABNORMAL HIGH (ref 70–99)
Potassium: 4 mmol/L (ref 3.5–5.1)
Sodium: 140 mmol/L (ref 135–145)

## 2020-10-12 LAB — CBC WITH DIFFERENTIAL/PLATELET
Abs Immature Granulocytes: 0.03 10*3/uL (ref 0.00–0.07)
Basophils Absolute: 0 10*3/uL (ref 0.0–0.1)
Basophils Relative: 0 %
Eosinophils Absolute: 0 10*3/uL (ref 0.0–0.5)
Eosinophils Relative: 0 %
HCT: 45 % (ref 36.0–46.0)
Hemoglobin: 13.6 g/dL (ref 12.0–15.0)
Immature Granulocytes: 0 %
Lymphocytes Relative: 14 %
Lymphs Abs: 1.2 10*3/uL (ref 0.7–4.0)
MCH: 29.6 pg (ref 26.0–34.0)
MCHC: 30.2 g/dL (ref 30.0–36.0)
MCV: 98 fL (ref 80.0–100.0)
Monocytes Absolute: 0.5 10*3/uL (ref 0.1–1.0)
Monocytes Relative: 6 %
Neutro Abs: 6.6 10*3/uL (ref 1.7–7.7)
Neutrophils Relative %: 80 %
Platelets: 196 10*3/uL (ref 150–400)
RBC: 4.59 MIL/uL (ref 3.87–5.11)
RDW: 13.5 % (ref 11.5–15.5)
WBC: 8.4 10*3/uL (ref 4.0–10.5)
nRBC: 0 % (ref 0.0–0.2)

## 2020-10-12 LAB — URINALYSIS, ROUTINE W REFLEX MICROSCOPIC
Bilirubin Urine: NEGATIVE
Glucose, UA: 150 mg/dL — AB
Ketones, ur: NEGATIVE mg/dL
Nitrite: NEGATIVE
Protein, ur: NEGATIVE mg/dL
Specific Gravity, Urine: 1.016 (ref 1.005–1.030)
pH: 7 (ref 5.0–8.0)

## 2020-10-12 LAB — CBG MONITORING, ED
Glucose-Capillary: 155 mg/dL — ABNORMAL HIGH (ref 70–99)
Glucose-Capillary: 74 mg/dL (ref 70–99)

## 2020-10-12 MED ORDER — CEPHALEXIN 500 MG PO CAPS
500.0000 mg | ORAL_CAPSULE | Freq: Once | ORAL | Status: AC
  Administered 2020-10-12: 500 mg via ORAL
  Filled 2020-10-12: qty 1

## 2020-10-12 MED ORDER — CEPHALEXIN 500 MG PO CAPS: 500.0000 mg | ORAL_CAPSULE | Freq: Two times a day (BID) | ORAL | 0 refills | Status: DC

## 2020-10-12 NOTE — ED Provider Notes (Signed)
[facility] DEPT Provider Note   CSN: [id number] Arrival date & time: 10/12/20  1808     History Chief Complaint  Patient presents with  . Hypoglycemia    Angela Harvey is a 70 y.o. female.  Patient is a 70 year old female who presents with hypoglycemia.  She has a history of diabetes.  She takes [place] by in the morning and NovoLog 3 times a day.  She last took her [place] yesterday morning.  Her husband states that the patient was asleep when he left this morning.  His son called later in the day and noted that she been sleeping all day.  She has not eaten anything all day.  He came home and found her lethargic with some slurred speech.  She was generally weak.  He did not note any focal weakness.  EMS was called and found a blood sugar of 30.  She was given peanut butter sandwich and oral glucose and it improved to 90.  She was transferred for further evaluation.  She does not report any recent illnesses.  She said that she felt fine yesterday.  No fevers.  No vomiting or diarrhea.  No chest pain or shortness of breath.  No urinary symptoms.  No cough or cold symptoms.        Past Medical History:  Diagnosis Date  . Anemia   . GERD (gastroesophageal reflux disease)   . Hiatal hernia   . History of acute pancreatitis 07/2014  . History of colon polyps   . 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 Hypertension   . Left ureteral stone   . Type 2 diabetes mellitus treated with insulin [facility])     Patient Active Problem List   Diagnosis Date Noted  . Bacteremia due to Escherichia coli 04/23/2018  . Hyponatremia 04/20/2018  . Hyperkalemia 04/20/2018  . DKA (diabetic ketoacidoses) 04/20/2018  . Dementia with behavioral disturbance ([place]) 04/20/2018  . Hypothermia 04/20/2018  . DKA, type 2 ([place]) 04/20/2018  . Hypercalcemia 07/18/2017  . Acute  pancreatitis 07/18/2017  . Renal insufficiency 07/18/2017  . Ureteral calculus 04/28/2016  . Severe protein-calorie malnutrition ([place]) 08/15/2014  . Malnutrition of moderate degree ([place]) 08/15/2014  . Essential hypertension 08/14/2014  . Diabetes mellitus, type II ([place]) 08/14/2014  . GERD (gastroesophageal reflux disease) 08/14/2014  . Acute biliary pancreatitis 08/08/2014    Past Surgical History:  Procedure Laterality Date  . CHOLECYSTECTOMY N/A 08/16/2014   Procedure: LAPAROSCOPIC CHOLECYSTECTOMY WITH INTRAOPERATIVE CHOLANGIOGRAM;  Surgeon: Kaylyn Lim, MD;  Location: [facility];  Service: General;  Laterality: N/A;  . CYSTOSCOPY W/ URETERAL STENT PLACEMENT Left 04/28/2016   Procedure: CYSTOSCOPY WITH RETROGRADE PYELOGRAM/URETERAL STENT PLACEMENT;  Surgeon: Kathie Rhodes, MD;  Location: [facility];  Service: Urology;  Laterality: Left;  . CYSTOSCOPY WITH RETROGRADE PYELOGRAM, URETEROSCOPY AND STENT PLACEMENT Left 06/15/2016   Procedure: CYSTOSCOPY STENT REMOVAL  LEFT RETROGRADE PYELOGRAM, URETEROSCOPY, STONE BASKETRY;  Surgeon: Kathie Rhodes, MD;  Location: [place];  Service: Urology;  Laterality: Left;  . LAPAROSCOPIC PARTIAL GASTRECTOMY  1988   malignant tumor  . UMBILICAL HERNIA REPAIR  1970's  . VAGINAL HYSTERECTOMY  1980's     OB History   No obstetric history on file.     Family History  Problem Relation Age of Onset  . Renal cancer Neg Hx   . Renal Disease Neg Hx  Social History   Tobacco Use  . Smoking status: Never Smoker  . Smokeless tobacco: Never Used  Substance Use Topics  . Alcohol use: No  . Drug use: No    Home Medications Prior to Admission medications   Medication Sig Start Date End Date Taking? Authorizing Provider  acetaminophen (TYLENOL) 500 MG tablet Take 1,000 mg by mouth every 6 (six) hours as needed for moderate pain.   Yes [provider]  amLODipine (NORVASC) 10 MG tablet Take 1 tablet (10 mg total) by mouth daily.  04/25/18  Yes Oretha Milch D, MD  cephALEXin (KEFLEX) 500 MG capsule Take 1 capsule (500 mg total) by mouth 2 (two) times daily. 10/12/20  Yes Malvin Johns, MD  Esomeprazole Magnesium (NEXIUM PO) Take 1 tablet by mouth 2 (two) times daily.   Yes [provider]  guaiFENesin-dextromethorphan (ROBITUSSIN DM) 100-10 MG/5ML syrup Take 5 mLs by mouth every 4 (four) hours as needed for cough. 04/25/18  Yes Oretha Milch D, MD  losartan (COZAAR) 50 MG tablet Take 1 tablet (50 mg total) by mouth daily. Patient taking differently: Take 50 mg by mouth 2 (two) times daily. 04/25/18  Yes Oretha Milch D, MD  memantine (NAMENDA XR) 28 MG CP24 24 hr capsule Take 1 capsule (28 mg total) by mouth daily. 04/25/18  Yes Oretha Milch D, MD  Multiple Vitamin (MULTIVITAMIN WITH MINERALS) TABS tablet Take 1 tablet by mouth daily. 04/25/18  Yes Desiree Hane, MD  NOVOLOG FLEXPEN 100 UNIT/ML FlexPen Inject 10 Units into the skin 3 (three) times daily before meals. Patient taking differently: Inject 15 Units into the skin 3 (three) times daily before meals. Uses [place] blue 04/25/18  Yes Oretha Milch D, MD  Phenylephrine HCl (AFRIN ALLERGY NA) Place 2 sprays into both nostrils daily as needed (ALLERGIES).   Yes [provider]  Propylene Glycol (SYSTANE BALANCE OP) Place 1 drop into both eyes daily as needed (dry eyes).   Yes [provider]  TRESIBA FLEXTOUCH 200 UNIT/ML SOPN Inject 20 Units into the skin every morning. Patient taking differently: Inject 40 Units into the skin every morning. 04/25/18  Yes Oretha Milch D, MD  acetaminophen (TYLENOL) 325 MG tablet Take 2 tablets (650 mg total) by mouth daily as needed (PAIN). Patient not taking: No sig reported 04/25/18   Oretha Milch D, MD  pantoprazole (PROTONIX) 20 MG tablet Take 3 tablets (60 mg total) by mouth 2 (two) times daily before a meal. Patient not taking: No sig reported 04/25/18   Oretha Milch D, MD  sertraline  (ZOLOFT) 100 MG tablet Take 1 tablet (100 mg total) by mouth daily. Patient not taking: No sig reported 04/25/18   Desiree Hane, MD    Allergies    Patient has no known allergies.  Review of Systems   Review of Systems  Constitutional: Positive for fatigue (Today). Negative for chills, diaphoresis and fever.  HENT: Negative for congestion, rhinorrhea and sneezing.   Eyes: Negative.   Respiratory: Negative for cough, chest tightness and shortness of breath.   Cardiovascular: Negative for chest pain and leg swelling.  Gastrointestinal: Negative for abdominal pain, blood in stool, diarrhea, nausea and vomiting.  Genitourinary: Negative for difficulty urinating, flank pain, frequency and hematuria.  Musculoskeletal: Negative for arthralgias and back pain.  Skin: Negative for rash.  Neurological: Negative for dizziness, speech difficulty, weakness, numbness and headaches.    Physical Exam Updated Vital Signs BP (!) 148/71   Pulse 70   Temp  98 F (36.7 C) (Oral)   Resp 16   SpO2 99%   Physical Exam Constitutional:      Appearance: She is well-developed.  HENT:     Head: Normocephalic and atraumatic.  Eyes:     Pupils: Pupils are equal, round, and reactive to light.  Cardiovascular:     Rate and Rhythm: Normal rate and regular rhythm.     Heart sounds: Normal heart sounds.  Pulmonary:     Effort: Pulmonary effort is normal. No respiratory distress.     Breath sounds: Normal breath sounds. No wheezing or rales.  Chest:     Chest wall: No tenderness.  Abdominal:     General: Bowel sounds are normal.     Palpations: Abdomen is soft.     Tenderness: There is no abdominal tenderness. There is no guarding or rebound.  Musculoskeletal:        General: Normal range of motion.     Cervical back: Normal range of motion and neck supple.  Lymphadenopathy:     Cervical: No cervical adenopathy.  Skin:    General: Skin is warm and dry.     Findings: No rash.  Neurological:      General: No focal deficit present.     Mental Status: She is alert and oriented to person, place, and time.     ED Results / Procedures / Treatments   Labs (all labs ordered are listed, but only abnormal results are displayed) Labs Reviewed  BASIC METABOLIC PANEL - Abnormal; Notable for the following components:      Result Value   Glucose, Bld 158 (*)    Calcium 8.5 (*)    All other components within normal limits  URINALYSIS, ROUTINE W REFLEX MICROSCOPIC - Abnormal; Notable for the following components:   APPearance CLOUDY (*)    Glucose, UA 150 (*)    Hgb urine dipstick SMALL (*)    Leukocytes,Ua TRACE (*)    Bacteria, UA MANY (*)    All other components within normal limits  CBG MONITORING, ED - Abnormal; Notable for the following components:   Glucose-Capillary 155 (*)    All other components within normal limits  URINE CULTURE  CBC WITH DIFFERENTIAL/PLATELET  CBG MONITORING, ED    EKG None  Radiology No results found.  Procedures Procedures   Medications Ordered in ED Medications  cephALEXin (KEFLEX) capsule 500 mg (500 mg Oral Given 10/12/20 2344)    ED Course  I have reviewed the triage vital signs and the nursing notes.  Pertinent labs & imaging results that were available during my care of the patient were reviewed by me and considered in my medical decision making (see chart for details).    MDM Rules/Calculators/A&P                          Patient is a 70 year old female who presents with low blood sugar.  She has not taken her long-acting insulin since yesterday morning.  However she did not eat breakfast this morning and has not eaten throughout the day.  She was found to be hypoglycemic by EMS.  She improved with treatment.  She is back to her baseline mental status.  She is afebrile.  No suggestions of sepsis.  Her urine does have some suggestions of infection and will treat with antibiotics.  Her blood sugar had remained stable through the ED  course.  However once we were about to discharge her,  it dropped to 74.  We will give her a sandwich and drink and reevaluate.  Pt care turned over to Dr. Wyvonnia Dusky pending revaluation.  If blood sugars remain stable, she can be discharged.  I did send a prescription in for Keflex. Final Clinical Impression(s) / ED Diagnoses Final diagnoses:  Lower urinary tract infectious disease  Hypoglycemia    Rx / DC Orders ED Discharge Orders         Ordered    cephALEXin (KEFLEX) 500 MG capsule  2 times daily        10/12/20 2325           Malvin Johns, MD 10/13/20 0013

## 2020-10-12 NOTE — Discharge Instructions (Signed)
Take the antibiotics as directed.  Do not take your insulin tonight.  Check your blood sugar tomorrow morning prior to taking your insulin.  Make sure that you are checking your blood sugar regularly throughout the day tomorrow.  Follow-up with your primary care doctor.  Return here as needed for any worsening symptoms.

## 2020-10-12 NOTE — ED Triage Notes (Signed)
Ems brings in patient in from home. Ems reports blood sugar was 30 when they arrived. Last blood sugar is 90 after 2 peanut butter sandwiches and oral glucose.

## 2020-10-12 NOTE — ED Notes (Signed)
Pt provided a sandwich and other snacks for BS

## 2020-10-13 ENCOUNTER — Emergency Department (HOSPITAL_COMMUNITY): Payer: Medicare Other

## 2020-10-13 ENCOUNTER — Encounter (HOSPITAL_COMMUNITY): Payer: Self-pay | Admitting: Internal Medicine

## 2020-10-13 DIAGNOSIS — G319 Degenerative disease of nervous system, unspecified: Secondary | ICD-10-CM | POA: Diagnosis not present

## 2020-10-13 DIAGNOSIS — E162 Hypoglycemia, unspecified: Secondary | ICD-10-CM

## 2020-10-13 DIAGNOSIS — R1110 Vomiting, unspecified: Secondary | ICD-10-CM | POA: Diagnosis not present

## 2020-10-13 DIAGNOSIS — I708 Atherosclerosis of other arteries: Secondary | ICD-10-CM | POA: Diagnosis not present

## 2020-10-13 DIAGNOSIS — I672 Cerebral atherosclerosis: Secondary | ICD-10-CM | POA: Diagnosis not present

## 2020-10-13 LAB — OCCULT BLOOD GASTRIC / DUODENUM (SPECIMEN CUP)
Occult Blood, Gastric: NEGATIVE
pH, Gastric: 2

## 2020-10-13 LAB — TROPONIN I (HIGH SENSITIVITY)
Troponin I (High Sensitivity): 7 ng/L (ref <18)
Troponin I (High Sensitivity): 9 ng/L (ref <18)

## 2020-10-13 LAB — GLUCOSE, CAPILLARY
Glucose-Capillary: 91 mg/dL (ref 70–99)
Glucose-Capillary: 120 mg/dL — ABNORMAL HIGH (ref 70–99)
Glucose-Capillary: 88 mg/dL (ref 70–99)

## 2020-10-13 LAB — RESP PANEL BY RT-PCR (FLU A&B, COVID) ARPGX2
Influenza A by PCR: NEGATIVE
Influenza B by PCR: NEGATIVE
SARS Coronavirus 2 by RT PCR: NEGATIVE

## 2020-10-13 LAB — CBG MONITORING, ED
Glucose-Capillary: 67 mg/dL — ABNORMAL LOW (ref 70–99)
Glucose-Capillary: 75 mg/dL (ref 70–99)
Glucose-Capillary: 82 mg/dL (ref 70–99)
Glucose-Capillary: 84 mg/dL (ref 70–99)
Glucose-Capillary: 92 mg/dL (ref 70–99)

## 2020-10-13 MED ORDER — PANTOPRAZOLE SODIUM 40 MG IV SOLR
40.0000 mg | Freq: Two times a day (BID) | INTRAVENOUS | Status: AC
  Administered 2020-10-13 – 2020-10-14 (×2): 40 mg via INTRAVENOUS
  Filled 2020-10-13 (×2): qty 40

## 2020-10-13 MED ORDER — ENOXAPARIN SODIUM 40 MG/0.4ML IJ SOSY
40.0000 mg | PREFILLED_SYRINGE | INTRAMUSCULAR | Status: DC
  Administered 2020-10-13 – 2020-10-14 (×2): 40 mg via SUBCUTANEOUS
  Filled 2020-10-13 (×2): qty 0.4

## 2020-10-13 MED ORDER — SODIUM CHLORIDE 0.9 % IV SOLN
1.0000 g | INTRAVENOUS | Status: DC
  Administered 2020-10-13 – 2020-10-14 (×2): 1 g via INTRAVENOUS
  Filled 2020-10-13 (×2): qty 1

## 2020-10-13 MED ORDER — DEXTROSE 50 % IV SOLN
50.0000 mL | Freq: Once | INTRAVENOUS | Status: DC
  Filled 2020-10-13: qty 50

## 2020-10-13 MED ORDER — ONDANSETRON HCL 4 MG/2ML IJ SOLN
4.0000 mg | Freq: Once | INTRAMUSCULAR | Status: AC
  Administered 2020-10-13: 4 mg via INTRAVENOUS
  Filled 2020-10-13: qty 2

## 2020-10-13 MED ORDER — IOHEXOL 300 MG/ML  SOLN
100.0000 mL | Freq: Once | INTRAMUSCULAR | Status: AC | PRN
  Administered 2020-10-13: 100 mL via INTRAVENOUS

## 2020-10-13 MED ORDER — AMLODIPINE BESYLATE 10 MG PO TABS
10.0000 mg | ORAL_TABLET | Freq: Every day | ORAL | Status: DC
  Administered 2020-10-13 – 2020-10-15 (×3): 10 mg via ORAL
  Filled 2020-10-13 (×3): qty 1

## 2020-10-13 MED ORDER — PANTOPRAZOLE SODIUM 40 MG IV SOLR: 40.0000 mg | Freq: Two times a day (BID) | INTRAVENOUS | Status: DC

## 2020-10-13 MED ORDER — SODIUM CHLORIDE 0.9 % IV SOLN
8.0000 mg/h | INTRAVENOUS | Status: DC
  Administered 2020-10-13: 8 mg/h via INTRAVENOUS
  Filled 2020-10-13: qty 80

## 2020-10-13 MED ORDER — SODIUM CHLORIDE 0.9 % IV BOLUS
1000.0000 mL | Freq: Once | INTRAVENOUS | Status: AC
  Administered 2020-10-13: 1000 mL via INTRAVENOUS

## 2020-10-13 MED ORDER — MEMANTINE HCL ER 28 MG PO CP24
28.0000 mg | ORAL_CAPSULE | Freq: Every day | ORAL | Status: DC
  Administered 2020-10-13 – 2020-10-15 (×3): 28 mg via ORAL
  Filled 2020-10-13 (×3): qty 1

## 2020-10-13 MED ORDER — ACETAMINOPHEN 500 MG PO TABS
1000.0000 mg | ORAL_TABLET | Freq: Four times a day (QID) | ORAL | Status: DC | PRN
  Administered 2020-10-13: 1000 mg via ORAL
  Filled 2020-10-13: qty 2

## 2020-10-13 MED ORDER — SODIUM CHLORIDE 0.9 % IV SOLN
80.0000 mg | Freq: Once | INTRAVENOUS | Status: AC
  Administered 2020-10-13: 80 mg via INTRAVENOUS
  Filled 2020-10-13: qty 80

## 2020-10-13 MED ORDER — DEXTROSE IN LACTATED RINGERS 5 % IV SOLN: INTRAVENOUS | Status: DC

## 2020-10-13 MED ORDER — ONDANSETRON HCL 4 MG/2ML IJ SOLN
4.0000 mg | Freq: Four times a day (QID) | INTRAMUSCULAR | Status: DC | PRN
  Administered 2020-10-13: 4 mg via INTRAVENOUS
  Filled 2020-10-13: qty 2

## 2020-10-13 NOTE — ED Notes (Signed)
Pt vomiting brown emesis.

## 2020-10-13 NOTE — ED Notes (Signed)
Pt threw up x2 after eating. MD notified.

## 2020-10-13 NOTE — ED Notes (Signed)
Pt held down orange juice after administering for low glucose level.

## 2020-10-13 NOTE — H&P (Signed)
History and Physical    [name] YWV:[id number] DOB: 16-Jan-1951 DOA: 10/12/2020  PCP: Burnard Bunting, MD  Patient coming from: Home  I have personally briefly reviewed patient's old medical records available.   Chief Complaint: Low blood sugars, lethargic and sleeping all day  HPI: Angela Harvey is a 70 y.o. female with medical history significant of dementia with behavioral disturbances, type 2 diabetes on insulin, GERD, history of gastric cancer status post gastric resection in 1988 and occasional nausea and vomiting since then, hypertension who was brought to the emergency room by EMS called by family at home because she was very lethargic and sleepy and blood sugar was 30. Patient has dementia, she is pleasantly confused and unable to describe any symptoms.  She lives with her husband at home who goes to work and her son intermittently monitors her. History taken from husband on the phone. Patient does have occasional history of nausea after she had gastric surgery in 1988.  She also gets some UTIs and had bacteremia 2 years ago in the hospital and she had similar symptoms. Her husband picked her medications and insulin doses along with reminders and went to work.  Her son came back to check on her and she was sleepy, has not eaten all day, was very lethargic and noted to be slurred speech.  Did not have any focal deficits.  EMS was called and found to have a blood sugar of 30.  She was given peanut butter sandwich and some oral glucose and blood sugar improved to 90.  Brought to ER for further evaluation. Patient herself tells me that she was traveling from [place] and might not have eaten well.   ED Course: Hemodynamically stable.  Tachycardic on arrival.  Blood sugars 82-67-75-92.  After arriving to ER he also started having nausea vomiting and not tolerating diet.  Started on IV fluids and admission requested. Urinalysis consistent with UTI, given 1 dose of Keflex in  anticipation of discharge from the ER.  Review of Systems: all systems are reviewed and pertinent positive as per HPI otherwise rest are negative.    Past Medical History:  Diagnosis Date  . Anemia   . GERD (gastroesophageal reflux disease)   . Hiatal hernia   . History of acute pancreatitis 07/2014  . History of colon polyps   . 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 Hypertension   . Left ureteral stone   . Type 2 diabetes mellitus treated with insulin [facility])     Past Surgical History:  Procedure Laterality Date  . CHOLECYSTECTOMY N/A 08/16/2014   Procedure: LAPAROSCOPIC CHOLECYSTECTOMY WITH INTRAOPERATIVE CHOLANGIOGRAM;  Surgeon: Kaylyn Lim, MD;  Location: [facility];  Service: General;  Laterality: N/A;  . CYSTOSCOPY W/ URETERAL STENT PLACEMENT Left 04/28/2016   Procedure: CYSTOSCOPY WITH RETROGRADE PYELOGRAM/URETERAL STENT PLACEMENT;  Surgeon: Kathie Rhodes, MD;  Location: [facility];  Service: Urology;  Laterality: Left;  . CYSTOSCOPY WITH RETROGRADE PYELOGRAM, URETEROSCOPY AND STENT PLACEMENT Left 06/15/2016   Procedure: CYSTOSCOPY STENT REMOVAL  LEFT RETROGRADE PYELOGRAM, URETEROSCOPY, STONE BASKETRY;  Surgeon: Kathie Rhodes, MD;  Location: [facility];  Service: Urology;  Laterality: Left;  . LAPAROSCOPIC PARTIAL GASTRECTOMY  1988   malignant tumor  . UMBILICAL HERNIA REPAIR  1970's  . VAGINAL HYSTERECTOMY  1980's    Social history   reports that she has never smoked. She  has never used smokeless tobacco. She reports that she does not drink alcohol and does not use drugs.  No Known Allergies  Family History  Problem Relation Age of Onset  . Renal cancer Neg Hx   . Renal Disease Neg Hx      Prior to Admission medications   Medication Sig Start Date End Date Taking? Authorizing Provider  acetaminophen (TYLENOL) 500 MG tablet Take 1,000 mg by mouth every 6  (six) hours as needed for moderate pain.   Yes [provider]  amLODipine (NORVASC) 10 MG tablet Take 1 tablet (10 mg total) by mouth daily. 04/25/18  Yes Oretha Milch D, MD  cephALEXin (KEFLEX) 500 MG capsule Take 1 capsule (500 mg total) by mouth 2 (two) times daily. 10/12/20  Yes Malvin Johns, MD  Esomeprazole Magnesium (NEXIUM PO) Take 1 tablet by mouth 2 (two) times daily.   Yes [provider]  guaiFENesin-dextromethorphan (ROBITUSSIN DM) 100-10 MG/5ML syrup Take 5 mLs by mouth every 4 (four) hours as needed for cough. 04/25/18  Yes Oretha Milch D, MD  losartan (COZAAR) 50 MG tablet Take 1 tablet (50 mg total) by mouth daily. Patient taking differently: Take 50 mg by mouth 2 (two) times daily. 04/25/18  Yes Oretha Milch D, MD  memantine (NAMENDA XR) 28 MG CP24 24 hr capsule Take 1 capsule (28 mg total) by mouth daily. 04/25/18  Yes Oretha Milch D, MD  Multiple Vitamin (MULTIVITAMIN WITH MINERALS) TABS tablet Take 1 tablet by mouth daily. 04/25/18  Yes Desiree Hane, MD  NOVOLOG FLEXPEN 100 UNIT/ML FlexPen Inject 10 Units into the skin 3 (three) times daily before meals. Patient taking differently: Inject 15 Units into the skin 3 (three) times daily before meals. Uses [place] blue 04/25/18  Yes Oretha Milch D, MD  Phenylephrine HCl (AFRIN ALLERGY NA) Place 2 sprays into both nostrils daily as needed (ALLERGIES).   Yes [provider]  Propylene Glycol (SYSTANE BALANCE OP) Place 1 drop into both eyes daily as needed (dry eyes).   Yes [provider]  TRESIBA FLEXTOUCH 200 UNIT/ML SOPN Inject 20 Units into the skin every morning. Patient taking differently: Inject 40 Units into the skin every morning. 04/25/18  Yes Oretha Milch D, MD    Physical Exam: Vitals:   10/13/20 0615 10/13/20 0630 10/13/20 0645 10/13/20 0700  BP:  (!) 178/76  (!) 147/63  Pulse: 81 86 67 74  Resp: (!) 21 (!) 25 12 15   Temp:      TempSrc:      SpO2: 98% 99% 97% 98%     Constitutional: Looks quite comfortable.  She was retching when I examined her and improved with Zofran.  Pleasant and confused. Vitals:   10/13/20 0615 10/13/20 0630 10/13/20 0645 10/13/20 0700  BP:  (!) 178/76  (!) 147/63  Pulse: 81 86 67 74  Resp: (!) 21 (!) 25 12 15   Temp:      TempSrc:      SpO2: 98% 99% 97% 98%   Eyes: PERRL, lids and conjunctivae normal ENMT: Mucous membranes are moist. Posterior pharynx clear of any exudate or lesions.Normal dentition.  Neck: normal, supple, no masses, no thyromegaly Respiratory: clear to auscultation bilaterally, no wheezing, no crackles. Normal respiratory effort. No accessory muscle use.  Cardiovascular: Regular rate and rhythm, no murmurs / rubs / gallops. No extremity edema. 2+ pedal pulses. No carotid bruits.  Abdomen: no tenderness, no masses palpated. No hepatosplenomegaly. Bowel sounds positive.  Musculoskeletal: no clubbing /  cyanosis. No joint deformity upper and lower extremities. Good ROM, no contractures. Normal muscle tone.  Skin: no rashes, lesions, ulcers. No induration Neurologic: CN 2-12 grossly intact. Sensation intact, DTR normal. Strength 5/5 in all 4.  Psychiatric: Judgment and insight compromised.  She is alert and awake but oriented x0.    Labs on Admission: I have personally reviewed following labs and imaging studies  CBC: Recent Labs  Lab 10/12/20 2028  WBC 8.4  NEUTROABS 6.6  HGB 13.6  HCT 45.0  MCV 98.0  PLT 110   Basic Metabolic Panel: Recent Labs  Lab 10/12/20 2028  NA 140  K 4.0  CL 111  CO2 22  GLUCOSE 158*  BUN 18  CREATININE 0.74  CALCIUM 8.5*   GFR: CrCl cannot be calculated (Unknown ideal weight.). Liver Function Tests: No results for input(s): AST, ALT, ALKPHOS, BILITOT, PROT, ALBUMIN in the last 168 hours. No results for input(s): LIPASE, AMYLASE in the last 168 hours. No results for input(s): AMMONIA in the last 168 hours. Coagulation Profile: No results for input(s): INR,  PROTIME in the last 168 hours. Cardiac Enzymes: No results for input(s): CKTOTAL, CKMB, CKMBINDEX, TROPONINI in the last 168 hours. BNP (last 3 results) No results for input(s): PROBNP in the last 8760 hours. HbA1C: No results for input(s): HGBA1C in the last 72 hours. CBG: Recent Labs  Lab 10/13/20 0039 10/13/20 0212 10/13/20 0529 10/13/20 0604 10/13/20 0644  GLUCAP 82 84 67* 75 92   Lipid Profile: No results for input(s): CHOL, HDL, LDLCALC, TRIG, CHOLHDL, LDLDIRECT in the last 72 hours. Thyroid Function Tests: No results for input(s): TSH, T4TOTAL, FREET4, T3FREE, THYROIDAB in the last 72 hours. Anemia Panel: No results for input(s): VITAMINB12, FOLATE, FERRITIN, TIBC, IRON, RETICCTPCT in the last 72 hours. Urine analysis:    Component Value Date/Time   COLORURINE YELLOW 10/12/2020 2200   APPEARANCEUR CLOUDY (A) 10/12/2020 2200   LABSPEC 1.016 10/12/2020 2200   PHURINE 7.0 10/12/2020 2200   GLUCOSEU 150 (A) 10/12/2020 2200   HGBUR SMALL (A) 10/12/2020 2200   BILIRUBINUR NEGATIVE 10/12/2020 2200   KETONESUR NEGATIVE 10/12/2020 2200   PROTEINUR NEGATIVE 10/12/2020 2200   UROBILINOGEN 0.2 08/08/2014 1505   NITRITE NEGATIVE 10/12/2020 2200   LEUKOCYTESUR TRACE (A) 10/12/2020 2200    Radiological Exams on Admission: CT Head Wo Contrast  Result Date: 10/13/2020 CLINICAL DATA:  Hypoglycemia EXAM: CT HEAD WITHOUT CONTRAST TECHNIQUE: Contiguous axial images were obtained from the base of the skull through the vertex without intravenous contrast. COMPARISON:  MRI 10/21/2017 FINDINGS: Brain: There is diffuse prominence of the extra-axial CSF spaces with a frontoparietal predominance. Bridging vessels are seen coursing through the space suggesting atrophy rather than subdural hygroma. Overall appearance is not significantly changed from comparison studies. No evidence of acute infarction, hemorrhage, hydrocephalus, visible mass lesion or mass effect. Patchy areas of white matter  hypoattenuation are most compatible with chronic microvascular angiopathy. Vascular: Atherosclerotic calcification of the carotid siphons and intradural vertebral arteries. No hyperdense vessel. Skull: No calvarial fracture or suspicious osseous lesion. No scalp swelling or hematoma. Sinuses/Orbits: Paranasal sinuses and mastoid air cells are predominantly clear. Included orbital structures are unremarkable. Other: Debris in the bilateral external auditory canals. IMPRESSION: No acute intracranial abnormality. Frontoparietal predominant cerebral atrophy with marked prominence of the extra-axial CSF spaces, similar in configuration to comparison studies. Background patchy white matter hypoattenuation likely reflecting underlying microvascular angiopathy. Intracranial atherosclerosis. Debris in the external auditory canals, correlate for cerumen impaction. Electronically Signed  By: Lovena Le M.D.   On: 10/13/2020 04:47   CT ABDOMEN PELVIS W CONTRAST  Result Date: 10/13/2020 CLINICAL DATA:  Nausea and vomiting which has resolved. Hypoglycemia. EXAM: CT ABDOMEN AND PELVIS WITH CONTRAST TECHNIQUE: Multidetector CT imaging of the abdomen and pelvis was performed using the standard protocol following bolus administration of intravenous contrast. CONTRAST:  146mL OMNIPAQUE IOHEXOL 300 MG/ML  SOLN COMPARISON:  07/18/2017 FINDINGS: Lower chest: Coronary atherosclerosis. Small sliding hiatal hernia with fluid in the lower esophagus. Subpleural triangular nodule in the left lower lobe measuring 6 mm, stable since 2006 at least and benign. Hepatobiliary: No focal liver abnormality.Cholecystectomy. Pancreas: Unremarkable. Spleen: Unremarkable. Adrenals/Urinary Tract: Negative adrenals. No hydronephrosis or stone. Symmetric renal cortical lobulation and mild thinning. Unremarkable bladder. Stomach/Bowel: No obstruction. Gastric bypass. Small hiatal hernia. No visible bowel inflammation. Vascular/Lymphatic: No acute  vascular finding. Mild atheromatous changes. No mass or adenopathy. Reproductive:Hysterectomy. Other: No ascites or pneumoperitoneum. Abdominal wall scarring with broad herniation of fat partially through the muscular abdominal wall. Musculoskeletal: Lower thoracic spondylosis and advanced lower lumbar degeneration. Severe spinal stenosis at L4-5. IMPRESSION: No acute finding.  No bowel obstruction or visible inflammation. Electronically Signed   By: Monte Fantasia M.D.   On: 10/13/2020 04:41    EKG: Independently reviewed.  Normal sinus rhythm.  No acute ST-T wave changes.  Assessment/Plan Principal Problem:   Hypoglycemia Active Problems:   Essential hypertension   Diabetes mellitus, type II (HCC)   GERD (gastroesophageal reflux disease)   Dementia with behavioral disturbance ([place])     1. Hypoglycemia due to insulin: IDDM 2. Patient had taken long-acting insulin 24 hours ago, persistently hypoglycemic.  This is because of taking insulin and not eating. Agree with admission for observation given severity of symptoms and persistent hypoglycemia in the setting of ongoing nausea and vomiting and unable to take oral hydration and sugar. Stop all long-acting insulins. Will treat with 5% dextrose and Ringer lactate, will challenge with clears along with nausea medications. Will start patient on sliding scale insulin once blood sugars persistently more than 200.  Will accept hyperglycemia today. Her blood sugars are otherwise well controlled at home, once she has improved from nausea vomiting and is status regular diet, she can go back on her regular insulin doses.  2. Nausea and vomiting: Probably due to her history of gastric resection.  CT scan with no evidence of abnormalities, obstruction. IV fluids.  Nausea medications.  Challenge with clears and sips of water. Negative for blood on vomitus.  Symptomatic treatment with Protonix IV today.  3. Acute UTI present on admission: Previous  history of E. coli UTI and bacteremia.  Changed to Rocephin IV until final cultures and clinical improvement.  4. Dementia: Symptomatic treatment.  Patient is on Namenda that she can continue if able to take by mouth.  5. Hypertension: Blood pressures fairly stable now after hydration.  Continue amlodipine.  Hold losartan today.   DVT prophylaxis: Lovenox subcu Code Status: Full code Family Communication: husband on the phone   Disposition Plan: Home once stable Consults called: None Admission status: Observation, telemetry.   Barb Merino MD [facility] Pager [phone]

## 2020-10-13 NOTE — ED Provider Notes (Signed)
Signout from Dr. Tamera Punt.  Patient with hyperglycemia history of diabetes with long-acting insulin.  She has had low blood sugar after taking her insulin not eating much all day.  EMS found her to have a blood sugar of 30.  Patient's blood sugar has improved with p.o. intake.  However she has developed new nausea and vomiting.  She does have a history of previous gastric tumor status postresection.  She denies any abdominal pain or chest pain or headache. She is found to have a questionable UTI was started on Keflex.  Family states she does vomit occasionally at home when she eats too much.  No further vomiting.  Blood sugar 67.  Patient will be given additional p.o. food.  CT abdomen pelvis shows no acute abnormalities.  CT head is nonacute and shows prominence of extra-axial CSF spaces on his chronic.  Patient states she feels better  Continued nausea vomiting and dry heaving.  Questionable coffee-ground's.  Gastricult sent in PPI drip initiated  Given her ongoing nausea and vomiting and variable blood sugars upon admission.  Blood sugar increased to 92 and D50 was not given.  She did have more nausea and vomiting after trying orange juice however.  Admission discussed with Dr. Sloan Leiter.   .Critical Care Performed by: Ezequiel Essex, MD Authorized by: Ezequiel Essex, MD   Critical care provider statement:    Critical care time (minutes):  45   Critical care was necessary to treat or prevent imminent or life-threatening deterioration of the following conditions:  Endocrine crisis   Critical care was time spent personally by me on the following activities:  Discussions with consultants, evaluation of patient's response to treatment, examination of patient, ordering and performing treatments and interventions, ordering and review of laboratory studies, ordering and review of radiographic studies, pulse oximetry, re-evaluation of patient's condition, obtaining history from patient or surrogate  and review of old charts      Ezequiel Essex, MD 10/13/20 [phone]

## 2020-10-13 NOTE — ED Notes (Signed)
Dr. Wyvonnia Dusky notified of pt BS being 11. He stated to give pt juice and crackers. Juice and crackers given. Will see if pt tolerates or if she vomits

## 2020-10-13 NOTE — ED Notes (Signed)
Pt. CBG 75, RN, Hildred Alamin made aware.

## 2020-10-13 NOTE — ED Notes (Signed)
Pt dry heaving. MD notified. Awaiting orders

## 2020-10-13 NOTE — Plan of Care (Signed)

## 2020-10-14 ENCOUNTER — Other Ambulatory Visit: Payer: Self-pay

## 2020-10-14 DIAGNOSIS — Z6841 Body Mass Index (BMI) 40.0 and over, adult: Secondary | ICD-10-CM | POA: Diagnosis not present

## 2020-10-14 DIAGNOSIS — Z20822 Contact with and (suspected) exposure to covid-19: Secondary | ICD-10-CM | POA: Diagnosis present

## 2020-10-14 DIAGNOSIS — K219 Gastro-esophageal reflux disease without esophagitis: Secondary | ICD-10-CM

## 2020-10-14 DIAGNOSIS — B9620 Unspecified Escherichia coli [E. coli] as the cause of diseases classified elsewhere: Secondary | ICD-10-CM | POA: Diagnosis present

## 2020-10-14 DIAGNOSIS — E162 Hypoglycemia, unspecified: Secondary | ICD-10-CM | POA: Diagnosis not present

## 2020-10-14 DIAGNOSIS — F0391 Unspecified dementia with behavioral disturbance: Secondary | ICD-10-CM | POA: Diagnosis present

## 2020-10-14 DIAGNOSIS — E1165 Type 2 diabetes mellitus with hyperglycemia: Secondary | ICD-10-CM

## 2020-10-14 DIAGNOSIS — Z85028 Personal history of other malignant neoplasm of stomach: Secondary | ICD-10-CM | POA: Diagnosis not present

## 2020-10-14 DIAGNOSIS — E11649 Type 2 diabetes mellitus with hypoglycemia without coma: Secondary | ICD-10-CM | POA: Diagnosis present

## 2020-10-14 DIAGNOSIS — N390 Urinary tract infection, site not specified: Secondary | ICD-10-CM | POA: Diagnosis present

## 2020-10-14 DIAGNOSIS — E669 Obesity, unspecified: Secondary | ICD-10-CM | POA: Diagnosis present

## 2020-10-14 DIAGNOSIS — Z79899 Other long term (current) drug therapy: Secondary | ICD-10-CM | POA: Diagnosis not present

## 2020-10-14 DIAGNOSIS — I10 Essential (primary) hypertension: Secondary | ICD-10-CM

## 2020-10-14 DIAGNOSIS — Z794 Long term (current) use of insulin: Secondary | ICD-10-CM | POA: Diagnosis not present

## 2020-10-14 LAB — BASIC METABOLIC PANEL
Anion gap: 9 (ref 5–15)
BUN: 17 mg/dL (ref 8–23)
CO2: 24 mmol/L (ref 22–32)
Calcium: 8.7 mg/dL — ABNORMAL LOW (ref 8.9–10.3)
Chloride: 106 mmol/L (ref 98–111)
Creatinine, Ser: 1.04 mg/dL — ABNORMAL HIGH (ref 0.44–1.00)
GFR, Estimated: 57 mL/min — ABNORMAL LOW (ref >60)
Glucose, Bld: 75 mg/dL (ref 70–99)
Potassium: 4 mmol/L (ref 3.5–5.1)
Sodium: 139 mmol/L (ref 135–145)

## 2020-10-14 LAB — CBC
HCT: 39.9 % (ref 36.0–46.0)
Hemoglobin: 12.2 g/dL (ref 12.0–15.0)
MCH: 29.5 pg (ref 26.0–34.0)
MCHC: 30.6 g/dL (ref 30.0–36.0)
MCV: 96.6 fL (ref 80.0–100.0)
Platelets: 281 10*3/uL (ref 150–400)
RBC: 4.13 MIL/uL (ref 3.87–5.11)
RDW: 13.4 % (ref 11.5–15.5)
WBC: 5 10*3/uL (ref 4.0–10.5)
nRBC: 0 % (ref 0.0–0.2)

## 2020-10-14 LAB — GLUCOSE, CAPILLARY
Glucose-Capillary: 109 mg/dL — ABNORMAL HIGH (ref 70–99)
Glucose-Capillary: 151 mg/dL — ABNORMAL HIGH (ref 70–99)
Glucose-Capillary: 126 mg/dL — ABNORMAL HIGH (ref 70–99)
Glucose-Capillary: 118 mg/dL — ABNORMAL HIGH (ref 70–99)

## 2020-10-14 MED ORDER — ENSURE MAX PROTEIN PO LIQD
11.0000 [oz_av] | Freq: Two times a day (BID) | ORAL | Status: DC
  Administered 2020-10-14 – 2020-10-15 (×2): 11 [oz_av] via ORAL
  Filled 2020-10-14 (×3): qty 330

## 2020-10-14 MED ORDER — SODIUM CHLORIDE 0.9 % IV SOLN
1.0000 g | INTRAVENOUS | Status: DC
  Administered 2020-10-15: 1 g via INTRAVENOUS
  Filled 2020-10-14: qty 1

## 2020-10-14 MED ORDER — PANTOPRAZOLE SODIUM 40 MG PO TBEC
40.0000 mg | DELAYED_RELEASE_TABLET | Freq: Two times a day (BID) | ORAL | Status: DC
  Administered 2020-10-15: 40 mg via ORAL
  Filled 2020-10-14: qty 1

## 2020-10-14 NOTE — Progress Notes (Signed)
PHARMACIST - PHYSICIAN COMMUNICATION DR:   Cathlean Sauer CONCERNING: Proton Pump Inhibitor IV to Oral Route Change Policy  The patient is receiving Protonix by the intravenous route. Based on criteria approved by the Pharmacy and [place], the medication is being converted to the equivalent oral dose form.   These criteria include:  -No Active GI bleeding  -Able to tolerate diet of full liquids (or better) or tube feeding  -Able to tolerate other medications by the oral or enteral route   If you have any questions about this conversion, please contact the Pharmacy Department (ext [phone]). Thank you.  Peggyann Juba, PharmD, BCPS 10/14/2020 8:24 AM

## 2020-10-14 NOTE — Progress Notes (Signed)
Initial Nutrition Assessment  DOCUMENTATION CODES:   Obesity unspecified  INTERVENTION:   -Ensure MAX Protein po BID, each supplement provides 150 kcal and 30 grams of protein  NUTRITION DIAGNOSIS:   Inadequate oral intake related to nausea,vomiting as evidenced by per patient/family report.  GOAL:   Patient will meet greater than or equal to 90% of their needs  MONITOR:   PO intake,Supplement acceptance,Weight trends,Labs,I & O's  REASON FOR ASSESSMENT:   Consult Assessment of nutrition requirement/status  ASSESSMENT:   70 year old female past medical history of dementia with behavioral disturbances, type 2 diabetes mellitus, GERD and gastric cancer s/p resection 1988 who presented with altered mentation.  She was found by her family with significant lethargy and somnolence, her capillary glucose was 30. Admitted to the hospital with working diagnosis of insulin induced hypoglycemia.  Patient admitted with poor PO intakes. Did not eat at all the day PTA. Pt with hypoglycemic events which improved with PO. Was having N/V yesterday after meals.  Pt consumed 100% of breakfast (providing 660 kcals and 10g protein) this morning. Tolerating meals today. Will add Ensure Max for additional protein.  Per weight records, last recorded weight from November 2019 (244 lbs).  Current weight: 236 lbs.  Medications reviewed.  Labs reviewed:  CBGs: 88-151  NUTRITION - FOCUSED PHYSICAL EXAM:  No depletions noted.  Diet Order:   Diet Order            Diet regular Room service appropriate? Yes; Fluid consistency: Thin  Diet effective now                 EDUCATION NEEDS:   No education needs have been identified at this time  Skin:  Skin Assessment: Reviewed RN Assessment  Last BM:  5/5  Height:   Ht Readings from Last 1 Encounters:  10/13/20 5\' 4"  (1.626 m)    Weight:   Wt Readings from Last 1 Encounters:  10/14/20 107.2 kg    BMI:  Body mass index is 40.58  kg/m.  Estimated Nutritional Needs:   Kcal:  1700-1900  Protein:  90-100g  Fluid:  1.7L/day  Clayton Bibles, MS, RD, LDN Inpatient Clinical Dietitian Contact information available via [employer]

## 2020-10-14 NOTE — Plan of Care (Signed)
  Problem: Urinary Elimination: Goal: Signs and symptoms of infection will decrease 10/14/2020 0148 by Josepha Pigg, RN Outcome: Progressing 10/13/2020 2151 by Josepha Pigg, RN Outcome: Progressing   Problem: Education: Goal: Knowledge of General Education information will improve Description: Including pain rating scale, medication(s)/side effects and non-pharmacologic comfort measures 10/14/2020 0148 by Josepha Pigg, RN Outcome: Progressing 10/13/2020 2151 by Josepha Pigg, RN Outcome: Progressing   Problem: Health Behavior/Discharge Planning: Goal: Ability to manage health-related needs will improve 10/14/2020 0148 by Josepha Pigg, RN Outcome: Progressing 10/13/2020 2151 by Josepha Pigg, RN Outcome: Progressing   Problem: Clinical Measurements: Goal: Ability to maintain clinical measurements within normal limits will improve 10/14/2020 0148 by Josepha Pigg, RN Outcome: Progressing 10/13/2020 2151 by Josepha Pigg, RN Outcome: Progressing Goal: Will remain free from infection 10/14/2020 0148 by Josepha Pigg, RN Outcome: Progressing 10/13/2020 2151 by Josepha Pigg, RN Outcome: Progressing Goal: Diagnostic test results will improve 10/14/2020 0148 by Josepha Pigg, RN Outcome: Progressing 10/13/2020 2151 by Josepha Pigg, RN Outcome: Progressing Goal: Respiratory complications will improve 10/14/2020 0148 by Josepha Pigg, RN Outcome: Progressing 10/13/2020 2151 by Josepha Pigg, RN Outcome: Progressing Goal: Cardiovascular complication will be avoided 10/14/2020 0148 by Josepha Pigg, RN Outcome: Progressing 10/13/2020 2151 by Josepha Pigg, RN Outcome: Progressing   Problem: Activity: Goal: Risk for activity intolerance will decrease 10/14/2020 0148 by Josepha Pigg, RN Outcome: Progressing 10/13/2020 2151 by Josepha Pigg, RN Outcome: Progressing   Problem: Nutrition: Goal: Adequate nutrition  will be maintained 10/14/2020 0148 by Josepha Pigg, RN Outcome: Progressing 10/13/2020 2151 by Josepha Pigg, RN Outcome: Progressing   Problem: Coping: Goal: Level of anxiety will decrease 10/14/2020 0148 by Josepha Pigg, RN Outcome: Progressing 10/13/2020 2151 by Josepha Pigg, RN Outcome: Progressing   Problem: Elimination: Goal: Will not experience complications related to bowel motility 10/14/2020 0148 by Josepha Pigg, RN Outcome: Progressing 10/13/2020 2151 by Josepha Pigg, RN Outcome: Progressing Goal: Will not experience complications related to urinary retention 10/14/2020 0148 by Josepha Pigg, RN Outcome: Progressing 10/13/2020 2151 by Josepha Pigg, RN Outcome: Progressing   Problem: Pain Managment: Goal: General experience of comfort will improve 10/14/2020 0148 by Josepha Pigg, RN Outcome: Progressing 10/13/2020 2151 by Josepha Pigg, RN Outcome: Progressing   Problem: Safety: Goal: Ability to remain free from injury will improve 10/14/2020 0148 by Josepha Pigg, RN Outcome: Progressing 10/13/2020 2151 by Josepha Pigg, RN Outcome: Progressing   Problem: Skin Integrity: Goal: Risk for impaired skin integrity will decrease 10/14/2020 0148 by Josepha Pigg, RN Outcome: Progressing 10/13/2020 2151 by Josepha Pigg, RN Outcome: Progressing

## 2020-10-14 NOTE — Progress Notes (Signed)
PROGRESS NOTE    TATIANA COURTER  XMI:[id number] DOB: 01-Sep-1950 DOA: 10/12/2020 PCP: Burnard Bunting, MD    Brief Narrative:  Mrs. Bale was admitted to the hospital with working diagnosis of insulin induced hypoglycemia.  70 year old female past medical history of dementia with behavioral disturbances, type 2 diabetes mellitus, GERD and gastric cancer s/p resection 1988 who presented with altered mentation.  She was found by her family with significant lethargy and somnolence, her capillary glucose was 30.  She received oral glucose and transported to the hospital.  Patient takes insulin at home.  On her initial physical examination blood pressure 178/76, heart rate 86, respiratory 25, oxygen saturation 97%, dry mucous membranes, lungs clear to auscultation bilaterally, heart S1-S2, present, rhythmic, soft abdomen, no lower extremity edema, she was disoriented but not agitated.  Sodium 140, potassium 4.0, chloride 111, bicarb 32, glucose 158, BUN 18, creatinine 0.74, white count 8.4, hemoglobin 13.6, hematocrit 45.0, platelets 196. SARS COVID-19 negative.  Urinalysis positive gravity 1.016, 6-10 red cells, 11-20 white cells.  CT of the head with no acute intracranial abnormality.  Frontoparietal predominant cerebral atrophy with marked prominence of the extra-axial CSF spaces. CT of the abdomen pelvis no acute findings.  EKG 73 bpm, left axis deviation, left anterior fascicular block, sinus rhythm, no significant ST segment T wave changes, noisy baseline.    Assessment & Plan:   Principal Problem:   Hypoglycemia Active Problems:   Essential hypertension   Diabetes mellitus, type II (HCC)   GERD (gastroesophageal reflux disease)   Dementia with behavioral disturbance ([place])   1. Insulin induced hypoglycemia. Patient has been on IV dextrose, capillary glucose is 88, 109 and 151, fasting 75.  She is tolerating po well.   Plan to discontinue IV dextrose and continue close  monitoring of capillary glucose.  Likely patient will need lower doses of insulin at home, not candidate for tight glucose control, considering her cognitive impairment and difficulty using insulin.   2. HTN. Continue blood pressure control with amlodipine, continue to hold on losartan to prevent hypotension.    3. Urine tract infection. Positive pyuria, due to cognitive impairment, difficult to define urinary symptoms. Will plan to treat with ceftriaxone for 3 doses.   4. Dementia. Continue with mamantine.   5. Obesity class 3. Calculated BMI is 40,5. Consult PT, OT and nutrition.   Patient continue to be at high risk for worsening hypoglycemia,   Status is: Inpatient  Remains inpatient appropriate because:IV treatments appropriate due to intensity of illness or inability to take PO   Dispo: The patient is from: Home              Anticipated d/c is to: Home              Patient currently is not medically stable to d/c.   Difficult to place patient No   DVT prophylaxis: Enoxaparin   Code Status:   full  Family Communication:  I spoke with patient's husband at the bedside, we talked in detail about patient's condition, plan of care and prognosis and all questions were addressed.    Subjective: Patient is feeling better, no nausea or vomiting, no dyspnea or chest pain. Positive confusion but not agitation, most information from her husband at the bedside   Objective: Vitals:   10/13/20 2022 10/14/20 0014 10/14/20 0353 10/14/20 0500  BP: (!) 137/59 (!) 136/56 133/70   Pulse: 81 82 82   Resp: 17 20 20    Temp: 98.3 F (  36.8 C) 98.3 F (36.8 C) 98.5 F (36.9 C)   TempSrc: Oral Oral Oral   SpO2: 98% 97% 94%   Weight:    107.2 kg  Height:        Intake/Output Summary (Last 24 hours) at 10/14/2020 1313 Last data filed at 10/14/2020 1010 Gross per 24 hour  Intake 886 ml  Output --  Net 886 ml   Filed Weights   10/13/20 1202 10/14/20 0500  Weight: 107 kg 107.2 kg     Examination:   General: Not in pain or dyspnea, deconditioned  Neurology: Awake and alert, non focal, positive cognitive impairment,.  E ENT: no pallor, no icterus, oral mucosa moist Cardiovascular: No JVD. S1-S2 present, rhythmic, no gallops, rubs, or murmurs. No lower extremity edema. Pulmonary:  Positive breath sounds bilaterally, adequate air movement, no wheezing, rhonchi or rales. Gastrointestinal. Abdomen soft and non tender Skin. No rashes Musculoskeletal: no joint deformities     Data Reviewed: I have personally reviewed following labs and imaging studies  CBC: Recent Labs  Lab 10/12/20 2028 10/14/20 0454  WBC 8.4 5.0  NEUTROABS 6.6  --   HGB 13.6 12.2  HCT 45.0 39.9  MCV 98.0 96.6  PLT 196 182   Basic Metabolic Panel: Recent Labs  Lab 10/12/20 2028 10/14/20 0454  NA 140 139  K 4.0 4.0  CL 111 106  CO2 22 24  GLUCOSE 158* 75  BUN 18 17  CREATININE 0.74 1.04*  CALCIUM 8.5* 8.7*   GFR: Estimated Creatinine Clearance: 59.3 mL/min (A) (by C-G formula based on SCr of 1.04 mg/dL (H)). Liver Function Tests: No results for input(s): AST, ALT, ALKPHOS, BILITOT, PROT, ALBUMIN in the last 168 hours. No results for input(s): LIPASE, AMYLASE in the last 168 hours. No results for input(s): AMMONIA in the last 168 hours. Coagulation Profile: No results for input(s): INR, PROTIME in the last 168 hours. Cardiac Enzymes: No results for input(s): CKTOTAL, CKMB, CKMBINDEX, TROPONINI in the last 168 hours. BNP (last 3 results) No results for input(s): PROBNP in the last 8760 hours. HbA1C: No results for input(s): HGBA1C in the last 72 hours. CBG: Recent Labs  Lab 10/13/20 1204 10/13/20 1706 10/13/20 2212 10/14/20 0740 10/14/20 1152  GLUCAP 91 120* 88 109* 151*   Lipid Profile: No results for input(s): CHOL, HDL, LDLCALC, TRIG, CHOLHDL, LDLDIRECT in the last 72 hours. Thyroid Function Tests: No results for input(s): TSH, T4TOTAL, FREET4, T3FREE, THYROIDAB  in the last 72 hours. Anemia Panel: No results for input(s): VITAMINB12, FOLATE, FERRITIN, TIBC, IRON, RETICCTPCT in the last 72 hours.    Radiology Studies: I have reviewed all of the imaging during this hospital visit personally     Scheduled Meds: . amLODipine  10 mg Oral Daily  . dextrose  50 mL Intravenous Once  . enoxaparin (LOVENOX) injection  40 mg Subcutaneous Q24H  . memantine  28 mg Oral Daily  . pantoprazole  40 mg Oral BID AC   Continuous Infusions: . cefTRIAXone (ROCEPHIN)  IV 1 g (10/14/20 0951)  . dextrose 5% lactated ringers 100 mL/hr at 10/13/20 0758     LOS: 0 days        Aleiah Mohammed Gerome Apley, MD

## 2020-10-15 DIAGNOSIS — F0391 Unspecified dementia with behavioral disturbance: Secondary | ICD-10-CM

## 2020-10-15 LAB — BASIC METABOLIC PANEL
Anion gap: 7 (ref 5–15)
BUN: 17 mg/dL (ref 8–23)
CO2: 28 mmol/L (ref 22–32)
Calcium: 9.1 mg/dL (ref 8.9–10.3)
Chloride: 106 mmol/L (ref 98–111)
Creatinine, Ser: 1.08 mg/dL — ABNORMAL HIGH (ref 0.44–1.00)
GFR, Estimated: 55 mL/min — ABNORMAL LOW (ref >60)
Glucose, Bld: 100 mg/dL — ABNORMAL HIGH (ref 70–99)
Potassium: 4 mmol/L (ref 3.5–5.1)
Sodium: 141 mmol/L (ref 135–145)

## 2020-10-15 LAB — URINE CULTURE: Culture: >=100000 — AB

## 2020-10-15 LAB — GLUCOSE, CAPILLARY
Glucose-Capillary: 114 mg/dL — ABNORMAL HIGH (ref 70–99)
Glucose-Capillary: 105 mg/dL — ABNORMAL HIGH (ref 70–99)
Glucose-Capillary: 90 mg/dL (ref 70–99)
Glucose-Capillary: 119 mg/dL — ABNORMAL HIGH (ref 70–99)

## 2020-10-15 MED ORDER — NOVOLOG FLEXPEN 100 UNIT/ML ~~LOC~~ SOPN: 5.0000 [IU] | PEN_INJECTOR | Freq: Three times a day (TID) | SUBCUTANEOUS | 3 refills | Status: DC

## 2020-10-15 MED ORDER — TRESIBA FLEXTOUCH 200 UNIT/ML ~~LOC~~ SOPN: 20.0000 [IU] | PEN_INJECTOR | Freq: Every morning | SUBCUTANEOUS | 3 refills | Status: DC

## 2020-10-15 NOTE — Evaluation (Signed)
Physical Therapy Evaluation Patient Details Name: Angela Harvey MRN: [id number] DOB: 06/22/1950 Today's Date: 10/15/2020   History of Present Illness  70 year old female who presented with altered mentation.  She was found by her family with significant lethargy and somnolence, her capillary glucose was 30. Dx of hypoglycemia. Past medical history of dementia with behavioral disturbances, type 2 diabetes mellitus, GERD and gastric cancer s/p resection 1988  Clinical Impression  Pt ambulated 110' without an assistive device without loss of balance. She has a mild limp, she reports h/o L hip being "worn out" and that it is stiff today. Pt declined trial of an assistive device. From a PT standpoint, she is ready to DC home. No further acute PT indicated/     Follow Up Recommendations No PT follow up    Equipment Recommendations  None recommended by PT    Recommendations for Other Services       Precautions / Restrictions Precautions Precautions: Fall Precaution Comments: denies falls in past 1 year Restrictions Weight Bearing Restrictions: No      Mobility  Bed Mobility Overal bed mobility: Modified Independent                  Transfers Overall transfer level: Independent                  Ambulation/Gait Ambulation/Gait assistance: Independent Gait Distance (Feet): 110 Feet Assistive device: None Gait Pattern/deviations: Decreased stride length;Decreased weight shift to left;Trunk flexed;Step-through pattern     General Gait Details: pt reports h/o spinal stenosis and L hip being "worn out", pt stated L hip is stiff from being in bed during hospitalization, no loss of balance  Stairs            Wheelchair Mobility    Modified Rankin (Stroke Patients Only)       Balance Overall balance assessment: Independent                                           Pertinent Vitals/Pain Pain Assessment: No/denies pain    Home Living  Family/patient expects to be discharged to:: Private residence Living Arrangements: Spouse/significant other Available Help at Discharge: Family;Available 24 hours/day Type of Home: House Home Access: Stairs to enter Entrance Stairs-Rails: Psychiatric nurse of Steps: 4 Home Layout: Two level;Bed/bath upstairs Home Equipment: Walker - 2 wheels;Cane - single point      Prior Function Level of Independence: Independent         Comments: walks in the community, denies falls in past 1 year     Hand Dominance        Extremity/Trunk Assessment   Upper Extremity Assessment Upper Extremity Assessment: Overall WFL for tasks assessed    Lower Extremity Assessment Lower Extremity Assessment: Overall WFL for tasks assessed    Cervical / Trunk Assessment Cervical / Trunk Assessment: Normal  Communication   Communication: No difficulties  Cognition Arousal/Alertness: Awake/alert Behavior During Therapy: WFL for tasks assessed/performed Overall Cognitive Status: Within Functional Limits for tasks assessed                                        General Comments      Exercises     Assessment/Plan    PT Assessment Patent does not need any further  PT services  PT Problem List         PT Treatment Interventions      PT Goals (Current goals can be found in the Care Plan section)  Acute Rehab PT Goals Patient Stated Goal: caring for the dog, Walker PT Goal Formulation: All assessment and education complete, DC therapy    Frequency     Barriers to discharge        Co-evaluation               AM-PAC PT "6 Clicks" Mobility  Outcome Measure Help needed turning from your back to your side while in a flat bed without using bedrails?: None Help needed moving from lying on your back to sitting on the side of a flat bed without using bedrails?: None Help needed moving to and from a bed to a chair (including a wheelchair)?: None Help  needed standing up from a chair using your arms (e.g., wheelchair or bedside chair)?: None Help needed to walk in hospital room?: None Help needed climbing 3-5 steps with a railing? : None 6 Click Score: 24    End of Session Equipment Utilized During Treatment: Gait belt Activity Tolerance: Patient tolerated treatment well Patient left: in chair;with call bell/phone within reach;with family/visitor present Nurse Communication: Mobility status      Time: 1100-1111 PT Time Calculation (min) (ACUTE ONLY): 11 min   Charges:   PT Evaluation $PT Eval Low Complexity: 1 Low         Angela Harvey PT 10/15/2020  Acute Rehabilitation Services Pager [phone] Office [phone]

## 2020-10-15 NOTE — Discharge Summary (Addendum)
Physician Discharge Summary  Angela Harvey [id number] DOB: 09/25/1950 DOA: 10/12/2020  PCP: Burnard Bunting, MD  Admit date: 10/12/2020 Discharge date: 10/15/2020  Admitted From: Home  Disposition:  Home   Recommendations for Outpatient Follow-up and new medication changes:  1. Follow up with Dr Winona Legato in 7 to 10 days.  2. Instructed to resume insulin at 50% dose if capillary glucose greater than 160 mg/dl.   I spoke with patient's husband at the bedside, we talked in detail about patient's condition, plan of care and prognosis and all questions were addressed.   Home Health: yes, nursing to follow up on glucose.  Equipment/Devices: na    Discharge Condition: stable  CODE STATUS: full  Diet recommendation:  Heart healthy and diabetic prudent.   Brief/Interim Summary: Angela Harvey was admitted to the hospital with working diagnosis of insulin induced hypoglycemia.  70 year old female past medical history of dementia with behavioral disturbances, type 2 diabetes mellitus, GERD and gastric cancer s/p resection 1988 who presented with altered mentation.  She was found by her family with significant lethargy and somnolence, her capillary glucose was 30.  She received oral glucose and transported to the hospital.  Patient takes insulin at home.  On her initial physical examination blood pressure 178/76, heart rate 86, respiratory rate 25, oxygen saturation 97%, dry mucous membranes, lungs clear to auscultation bilaterally, heart S1-S2, present, rhythmic, soft abdomen, no lower extremity edema, she was disoriented but not agitated.  Sodium 140, potassium 4.0, chloride 111, bicarb 32, glucose 158, BUN 18, creatinine 0.74, white count 8.4, hemoglobin 13.6, hematocrit 45.0, platelets 196. SARS COVID-19 negative.  Urinalysis specific gravity 1.016, 6-10 red cells, 11-20 white cells.  CT of the head with no acute intracranial abnormality.  Frontoparietal predominant cerebral atrophy with  marked prominence of the extra-axial CSF spaces. CT of the abdomen pelvis no acute findings.  EKG 73 bpm, left axis deviation, left anterior fascicular block, sinus rhythm, no significant ST segment T wave changes, noisy baseline.  Patient was placed on IV dextrose with improvement of hypoglycemia.   1.  Insulin induced hypoglycemia.  Patient was admitted to the medical ward, she received intravenous dextrose with good toleration. Dextrose was discontinued and her glucose has remained stable between 114, 105, 100, 90. At this point would recommend a more lenient glucose control, considering her cognitive impairment and risk of hypoglycemia. Target glucose 160-180 mg/dL.  Patient has been discharged with instructions to hold insulin until serum glucose greater than 160, resume at 50% of her home dose.  2.  Hypertension.  Patient will continue blood pressure control with amlodipine and losartan.  3.  Positive pyuria, urinary tract infection.  Difficult to define urinary symptoms, considering her risk of infections, she received 3 doses of ceftriaxone  4.  Dementia.  Continue memantine.  5.  Obesity class III.  Calculated BMI 40.5.   Discharge Diagnoses:  Principal Problem:   Hypoglycemia Active Problems:   Essential hypertension   Diabetes mellitus, type II (HCC)   GERD (gastroesophageal reflux disease)   Dementia with behavioral disturbance (HCC)    Discharge Instructions   Allergies as of 10/15/2020   No Known Allergies     Medication List    TAKE these medications   acetaminophen 500 MG tablet Commonly known as: TYLENOL Take 1,000 mg by mouth every 6 (six) hours as needed for moderate pain.   AFRIN ALLERGY NA Place 2 sprays into both nostrils daily as needed (ALLERGIES).   amLODipine 10 MG tablet  Commonly known as: NORVASC Take 1 tablet (10 mg total) by mouth daily.   cephALEXin 500 MG capsule Commonly known as: KEFLEX Take 1 capsule (500 mg total) by mouth 2  (two) times daily.   guaiFENesin-dextromethorphan 100-10 MG/5ML syrup Commonly known as: ROBITUSSIN DM Take 5 mLs by mouth every 4 (four) hours as needed for cough.   losartan 50 MG tablet Commonly known as: COZAAR Take 1 tablet (50 mg total) by mouth daily. What changed: when to take this   memantine 28 MG Cp24 24 hr capsule Commonly known as: NAMENDA XR Take 1 capsule (28 mg total) by mouth daily.   multivitamin with minerals Tabs tablet Take 1 tablet by mouth daily.   NEXIUM PO Take 1 tablet by mouth 2 (two) times daily.   NovoLOG FlexPen 100 UNIT/ML FlexPen Generic drug: insulin aspart Inject 5 Units into the skin 3 (three) times daily before meals. Start using when sugar is more than 160 or geater What changed:   how much to take  additional instructions   SYSTANE BALANCE OP Place 1 drop into both eyes daily as needed (dry eyes).   Tyler Aas FlexTouch 200 UNIT/ML FlexTouch Pen Generic drug: insulin degludec Inject 20 Units into the skin every morning. Start using when sugar 160 or greater. What changed: additional instructions       Follow-up Information    Schedule an appointment as soon as possible for a visit  with Burnard Bunting, MD.   Specialty: Internal Medicine Contact information: [address] [place] [address] [phone]              No Known Allergies      Procedures/Studies: CT Head Wo Contrast  Result Date: 10/13/2020 CLINICAL DATA:  Hypoglycemia EXAM: CT HEAD WITHOUT CONTRAST TECHNIQUE: Contiguous axial images were obtained from the base of the skull through the vertex without intravenous contrast. COMPARISON:  MRI 10/21/2017 FINDINGS: Brain: There is diffuse prominence of the extra-axial CSF spaces with a frontoparietal predominance. Bridging vessels are seen coursing through the space suggesting atrophy rather than subdural hygroma. Overall appearance is not significantly changed from comparison studies. No evidence of acute  infarction, hemorrhage, hydrocephalus, visible mass lesion or mass effect. Patchy areas of white matter hypoattenuation are most compatible with chronic microvascular angiopathy. Vascular: Atherosclerotic calcification of the carotid siphons and intradural vertebral arteries. No hyperdense vessel. Skull: No calvarial fracture or suspicious osseous lesion. No scalp swelling or hematoma. Sinuses/Orbits: Paranasal sinuses and mastoid air cells are predominantly clear. Included orbital structures are unremarkable. Other: Debris in the bilateral external auditory canals. IMPRESSION: No acute intracranial abnormality. Frontoparietal predominant cerebral atrophy with marked prominence of the extra-axial CSF spaces, similar in configuration to comparison studies. Background patchy white matter hypoattenuation likely reflecting underlying microvascular angiopathy. Intracranial atherosclerosis. Debris in the external auditory canals, correlate for cerumen impaction. Electronically Signed   By: Lovena Le M.D.   On: 10/13/2020 04:47   CT ABDOMEN PELVIS W CONTRAST  Result Date: 10/13/2020 CLINICAL DATA:  Nausea and vomiting which has resolved. Hypoglycemia. EXAM: CT ABDOMEN AND PELVIS WITH CONTRAST TECHNIQUE: Multidetector CT imaging of the abdomen and pelvis was performed using the standard protocol following bolus administration of intravenous contrast. CONTRAST:  168mL OMNIPAQUE IOHEXOL 300 MG/ML  SOLN COMPARISON:  07/18/2017 FINDINGS: Lower chest: Coronary atherosclerosis. Small sliding hiatal hernia with fluid in the lower esophagus. Subpleural triangular nodule in the left lower lobe measuring 6 mm, stable since 2006 at least and benign. Hepatobiliary: No focal liver abnormality.Cholecystectomy. Pancreas:  Unremarkable. Spleen: Unremarkable. Adrenals/Urinary Tract: Negative adrenals. No hydronephrosis or stone. Symmetric renal cortical lobulation and mild thinning. Unremarkable bladder. Stomach/Bowel: No obstruction.  Gastric bypass. Small hiatal hernia. No visible bowel inflammation. Vascular/Lymphatic: No acute vascular finding. Mild atheromatous changes. No mass or adenopathy. Reproductive:Hysterectomy. Other: No ascites or pneumoperitoneum. Abdominal wall scarring with broad herniation of fat partially through the muscular abdominal wall. Musculoskeletal: Lower thoracic spondylosis and advanced lower lumbar degeneration. Severe spinal stenosis at L4-5. IMPRESSION: No acute finding.  No bowel obstruction or visible inflammation. Electronically Signed   By: Monte Fantasia M.D.   On: 10/13/2020 04:41        Subjective: Patient is feeling better, no nausea or vomiting, no chest pain or dyspnea, tolerating po well.   Discharge Exam: Vitals:   10/14/20 2301 10/15/20 0540  BP: (!) 120/58 138/66  Pulse: 77 68  Resp: 15 18  Temp: 98.5 F (36.9 C) 98.4 F (36.9 C)  SpO2: 94% 94%   Vitals:   10/14/20 0500 10/14/20 1348 10/14/20 2301 10/15/20 0540  BP:  139/63 (!) 120/58 138/66  Pulse:  80 77 68  Resp:   15 18  Temp:  98.5 F (36.9 C) 98.5 F (36.9 C) 98.4 F (36.9 C)  TempSrc:  Oral Oral Oral  SpO2:  91% 94% 94%  Weight: 107.2 kg  105.2 kg   Height:        General: Not in pain or dyspnea,  Neurology: Awake and alert, non focal  E ENT: no pallor, no icterus, oral mucosa moist Cardiovascular: No JVD. S1-S2 present, rhythmic, no gallops, rubs, or murmurs. No lower extremity edema. Pulmonary: positive breath sounds bilaterally, adequate air movement, no wheezing, rhonchi or rales. Gastrointestinal. Abdomen soft and non tender Skin. No rashes Musculoskeletal: no joint deformities   The results of significant diagnostics from this hospitalization (including imaging, microbiology, ancillary and laboratory) are listed below for reference.     Microbiology: Recent Results (from the past 240 hour(s))  Urine culture     Status: Abnormal   Collection Time: 10/12/20 10:00 PM   Specimen: Urine,  Clean Catch  Result Value Ref Range Status   Specimen Description   Final    URINE, CLEAN CATCH Performed at [facility], [place] [address], [place], [place] [address]    Special Requests   Final    NONE Performed at [facility], [place] [address], [place], [place] [address]    Culture >=100,000 COLONIES/mL ESCHERICHIA COLI (A)  Final   Report Status 10/15/2020 FINAL  Final   Organism ID, Bacteria ESCHERICHIA COLI (A)  Final      Susceptibility   Escherichia coli - MIC*    AMPICILLIN <=2 SENSITIVE Sensitive     CEFAZOLIN <=4 SENSITIVE Sensitive     CEFEPIME <=0.12 SENSITIVE Sensitive     CEFTRIAXONE <=0.25 SENSITIVE Sensitive     CIPROFLOXACIN <=0.25 SENSITIVE Sensitive     GENTAMICIN <=1 SENSITIVE Sensitive     IMIPENEM <=0.25 SENSITIVE Sensitive     NITROFURANTOIN <=16 SENSITIVE Sensitive     TRIMETH/SULFA <=20 SENSITIVE Sensitive     AMPICILLIN/SULBACTAM <=2 SENSITIVE Sensitive     PIP/TAZO <=4 SENSITIVE Sensitive     * >=100,000 COLONIES/mL ESCHERICHIA COLI  Resp Panel by RT-PCR (Flu A&B, Covid) Nasopharyngeal Swab     Status: None   Collection Time: 10/13/20  6:26 AM   Specimen: Nasopharyngeal Swab; Nasopharyngeal(NP) swabs in vial transport medium  Result Value Ref Range Status   SARS  Coronavirus 2 by RT PCR NEGATIVE NEGATIVE Final    Comment: (NOTE) SARS-CoV-2 target nucleic acids are NOT DETECTED.  The SARS-CoV-2 RNA is generally detectable in upper respiratory specimens during the acute phase of infection. The lowest concentration of SARS-CoV-2 viral copies this assay can detect is 138 copies/mL. A negative result does not preclude SARS-Cov-2 infection and should not be used as the sole basis for treatment or other patient management decisions. A negative result may occur with  improper specimen collection/handling, submission of specimen other than nasopharyngeal swab, presence of viral mutation(s) within the areas targeted by  this assay, and inadequate number of viral copies(<138 copies/mL). A negative result must be combined with clinical observations, patient history, and epidemiological information. The expected result is Negative.  Fact Sheet for Patients:  EntrepreneurPulse.com.au  Fact Sheet for Healthcare Providers:  IncredibleEmployment.be  This test is no t yet approved or cleared by the [place] FDA and  has been authorized for detection and/or diagnosis of SARS-CoV-2 by FDA under an Emergency Use Authorization (EUA). This EUA will remain  in effect (meaning this test can be used) for the duration of the COVID-19 declaration under Section 564(b)(1) of the Act, 21 U.S.C.section 360bbb-3(b)(1), unless the authorization is terminated  or revoked sooner.       Influenza A by PCR NEGATIVE NEGATIVE Final   Influenza B by PCR NEGATIVE NEGATIVE Final    Comment: (NOTE) The Xpert Xpress SARS-CoV-2/FLU/RSV plus assay is intended as an aid in the diagnosis of influenza from Nasopharyngeal swab specimens and should not be used as a sole basis for treatment. Nasal washings and aspirates are unacceptable for Xpert Xpress SARS-CoV-2/FLU/RSV testing.  Fact Sheet for Patients: EntrepreneurPulse.com.au  Fact Sheet for Healthcare Providers: IncredibleEmployment.be  This test is not yet approved or cleared by the [place] FDA and has been authorized for detection and/or diagnosis of SARS-CoV-2 by FDA under an Emergency Use Authorization (EUA). This EUA will remain in effect (meaning this test can be used) for the duration of the COVID-19 declaration under Section 564(b)(1) of the Act, 21 U.S.C. section 360bbb-3(b)(1), unless the authorization is terminated or revoked.  Performed at [facility], [place] [address], [place], [place] [address]   Culture, blood (routine x 2)     Status: None (Preliminary  result)   Collection Time: 10/13/20 12:49 PM   Specimen: BLOOD  Result Value Ref Range Status   Specimen Description   Final    BLOOD RIGHT ANTECUBITAL Performed at [facility] [address], [place], [place] [address]    Special Requests   Final    BOTTLES DRAWN AEROBIC AND ANAEROBIC Blood Culture adequate volume Performed at [facility] [address] [place], [place] [address]    Culture   Final    NO GROWTH < 24 HOURS Performed at [facility] [address], [place], [place] [address]    Report Status PENDING  Incomplete  Culture, blood (routine x 2)     Status: None (Preliminary result)   Collection Time: 10/13/20 12:49 PM   Specimen: BLOOD RIGHT HAND  Result Value Ref Range Status   Specimen Description   Final    BLOOD RIGHT HAND Performed at [facility] [address] [place], [place] [address]    Special Requests   Final    BOTTLES DRAWN AEROBIC AND ANAEROBIC Blood Culture adequate volume Performed at [facility] [address] [place], [place]  [address]    Culture   Final    NO GROWTH < 24 HOURS Performed at [facility], [place] [address] [place], [place] [address]    Report Status PENDING  Incomplete     Labs: BNP (last 3 results) No results for input(s): BNP in the last 8760 hours. Basic Metabolic Panel: Recent Labs  Lab 10/12/20 2028 10/14/20 0454 10/15/20 0613  NA 140 139 141  K 4.0 4.0 4.0  CL 111 106 106  CO2 22 24 28   GLUCOSE 158* 75 100*  BUN 18 17 17   CREATININE 0.74 1.04* 1.08*  CALCIUM 8.5* 8.7* 9.1   Liver Function Tests: No results for input(s): AST, ALT, ALKPHOS, BILITOT, PROT, ALBUMIN in the last 168 hours. No results for input(s): LIPASE, AMYLASE in the last 168 hours. No results for input(s): AMMONIA in the last 168 hours. CBC: Recent Labs  Lab 10/12/20 2028 10/14/20 0454  WBC 8.4 5.0  NEUTROABS 6.6  --   HGB 13.6 12.2  HCT  45.0 39.9  MCV 98.0 96.6  PLT 196 281   Cardiac Enzymes: No results for input(s): CKTOTAL, CKMB, CKMBINDEX, TROPONINI in the last 168 hours. BNP: Invalid input(s): POCBNP CBG: Recent Labs  Lab 10/14/20 1605 10/14/20 2021 10/15/20 0021 10/15/20 0415 10/15/20 0740  GLUCAP 126* 118* 114* 105* 90   D-Dimer No results for input(s): DDIMER in the last 72 hours. Hgb A1c No results for input(s): HGBA1C in the last 72 hours. Lipid Profile No results for input(s): CHOL, HDL, LDLCALC, TRIG, CHOLHDL, LDLDIRECT in the last 72 hours. Thyroid function studies No results for input(s): TSH, T4TOTAL, T3FREE, THYROIDAB in the last 72 hours.  Invalid input(s): FREET3 Anemia work up No results for input(s): VITAMINB12, FOLATE, FERRITIN, TIBC, IRON, RETICCTPCT in the last 72 hours. Urinalysis    Component Value Date/Time   COLORURINE YELLOW 10/12/2020 2200   APPEARANCEUR CLOUDY (A) 10/12/2020 2200   LABSPEC 1.016 10/12/2020 2200   PHURINE 7.0 10/12/2020 2200   GLUCOSEU 150 (A) 10/12/2020 2200   HGBUR SMALL (A) 10/12/2020 2200   BILIRUBINUR NEGATIVE 10/12/2020 2200   KETONESUR NEGATIVE 10/12/2020 2200   PROTEINUR NEGATIVE 10/12/2020 2200   UROBILINOGEN 0.2 08/08/2014 1505   NITRITE NEGATIVE 10/12/2020 2200   LEUKOCYTESUR TRACE (A) 10/12/2020 2200   Sepsis Labs Invalid input(s): PROCALCITONIN,  WBC,  LACTICIDVEN Microbiology Recent Results (from the past 240 hour(s))  Urine culture     Status: Abnormal   Collection Time: 10/12/20 10:00 PM   Specimen: Urine, Clean Catch  Result Value Ref Range Status   Specimen Description   Final    URINE, CLEAN CATCH Performed at [facility], [place] [address] [place], [place] [address]    Special Requests   Final    NONE Performed at [facility], [place] [address], [place], [place] [address]    Culture >=100,000 COLONIES/mL ESCHERICHIA COLI (A)  Final   Report Status 10/15/2020 FINAL  Final   Organism ID,  Bacteria ESCHERICHIA COLI (A)  Final      Susceptibility   Escherichia coli - MIC*    AMPICILLIN <=2 SENSITIVE Sensitive     CEFAZOLIN <=4 SENSITIVE Sensitive     CEFEPIME <=0.12 SENSITIVE Sensitive     CEFTRIAXONE <=0.25 SENSITIVE Sensitive     CIPROFLOXACIN <=0.25 SENSITIVE Sensitive     GENTAMICIN <=1 SENSITIVE Sensitive     IMIPENEM <=0.25 SENSITIVE Sensitive     NITROFURANTOIN <=16 SENSITIVE Sensitive     TRIMETH/SULFA <=  20 SENSITIVE Sensitive     AMPICILLIN/SULBACTAM <=2 SENSITIVE Sensitive     PIP/TAZO <=4 SENSITIVE Sensitive     * >=100,000 COLONIES/mL ESCHERICHIA COLI  Resp Panel by RT-PCR (Flu A&B, Covid) Nasopharyngeal Swab     Status: None   Collection Time: 10/13/20  6:26 AM   Specimen: Nasopharyngeal Swab; Nasopharyngeal(NP) swabs in vial transport medium  Result Value Ref Range Status   SARS Coronavirus 2 by RT PCR NEGATIVE NEGATIVE Final    Comment: (NOTE) SARS-CoV-2 target nucleic acids are NOT DETECTED.  The SARS-CoV-2 RNA is generally detectable in upper respiratory specimens during the acute phase of infection. The lowest concentration of SARS-CoV-2 viral copies this assay can detect is 138 copies/mL. A negative result does not preclude SARS-Cov-2 infection and should not be used as the sole basis for treatment or other patient management decisions. A negative result may occur with  improper specimen collection/handling, submission of specimen other than nasopharyngeal swab, presence of viral mutation(s) within the areas targeted by this assay, and inadequate number of viral copies(<138 copies/mL). A negative result must be combined with clinical observations, patient history, and epidemiological information. The expected result is Negative.  Fact Sheet for Patients:  EntrepreneurPulse.com.au  Fact Sheet for Healthcare Providers:  IncredibleEmployment.be  This test is no t yet approved or cleared by the [place] FDA  and  has been authorized for detection and/or diagnosis of SARS-CoV-2 by FDA under an Emergency Use Authorization (EUA). This EUA will remain  in effect (meaning this test can be used) for the duration of the COVID-19 declaration under Section 564(b)(1) of the Act, 21 U.S.C.section 360bbb-3(b)(1), unless the authorization is terminated  or revoked sooner.       Influenza A by PCR NEGATIVE NEGATIVE Final   Influenza B by PCR NEGATIVE NEGATIVE Final    Comment: (NOTE) The Xpert Xpress SARS-CoV-2/FLU/RSV plus assay is intended as an aid in the diagnosis of influenza from Nasopharyngeal swab specimens and should not be used as a sole basis for treatment. Nasal washings and aspirates are unacceptable for Xpert Xpress SARS-CoV-2/FLU/RSV testing.  Fact Sheet for Patients: EntrepreneurPulse.com.au  Fact Sheet for Healthcare Providers: IncredibleEmployment.be  This test is not yet approved or cleared by the [place] FDA and has been authorized for detection and/or diagnosis of SARS-CoV-2 by FDA under an Emergency Use Authorization (EUA). This EUA will remain in effect (meaning this test can be used) for the duration of the COVID-19 declaration under Section 564(b)(1) of the Act, 21 U.S.C. section 360bbb-3(b)(1), unless the authorization is terminated or revoked.  Performed at [facility], [place] [address], [place], [place] [address]   Culture, blood (routine x 2)     Status: None (Preliminary result)   Collection Time: 10/13/20 12:49 PM   Specimen: BLOOD  Result Value Ref Range Status   Specimen Description   Final    BLOOD RIGHT ANTECUBITAL Performed at [facility] [address] [place], [place] [address]    Special Requests   Final    BOTTLES DRAWN AEROBIC AND ANAEROBIC Blood Culture adequate volume Performed at [facility] [address], [place], [place] [address]     Culture   Final    NO GROWTH < 24 HOURS Performed at [facility] [address] [place], [place] [address]    Report Status PENDING  Incomplete  Culture, blood (routine x 2)     Status: None (Preliminary result)   Collection Time: 10/13/20  12:49 PM   Specimen: BLOOD RIGHT HAND  Result Value Ref Range Status   Specimen Description   Final    BLOOD RIGHT HAND Performed at [facility] [address], [place], [place] [address]    Special Requests   Final    BOTTLES DRAWN AEROBIC AND ANAEROBIC Blood Culture adequate volume Performed at [facility] [address], [place], [place] [address]    Culture   Final    NO GROWTH < 24 HOURS Performed at [facility] [address], [place], [place] [address]    Report Status PENDING  Incomplete     Time coordinating discharge: 45 minutes  SIGNED:   Tawni Millers, MD  [facility] 10/15/2020, 11:04 AM

## 2020-10-18 LAB — CULTURE, BLOOD (ROUTINE X 2)
Culture: NO GROWTH
Culture: NO GROWTH
Special Requests: ADEQUATE
Special Requests: ADEQUATE

## 2020-10-21 DIAGNOSIS — R4189 Other symptoms and signs involving cognitive functions and awareness: Secondary | ICD-10-CM | POA: Diagnosis not present

## 2020-10-21 DIAGNOSIS — Z794 Long term (current) use of insulin: Secondary | ICD-10-CM | POA: Diagnosis not present

## 2020-10-21 DIAGNOSIS — E1165 Type 2 diabetes mellitus with hyperglycemia: Secondary | ICD-10-CM | POA: Diagnosis not present

## 2020-10-21 DIAGNOSIS — N390 Urinary tract infection, site not specified: Secondary | ICD-10-CM | POA: Diagnosis not present

## 2020-10-21 DIAGNOSIS — E162 Hypoglycemia, unspecified: Secondary | ICD-10-CM | POA: Diagnosis not present

## 2020-10-21 DIAGNOSIS — I10 Essential (primary) hypertension: Secondary | ICD-10-CM | POA: Diagnosis not present

## 2020-11-01 DIAGNOSIS — H401122 Primary open-angle glaucoma, left eye, moderate stage: Secondary | ICD-10-CM | POA: Diagnosis not present

## 2020-12-05 DIAGNOSIS — Z794 Long term (current) use of insulin: Secondary | ICD-10-CM | POA: Diagnosis not present

## 2020-12-05 DIAGNOSIS — I129 Hypertensive chronic kidney disease with stage 1 through stage 4 chronic kidney disease, or unspecified chronic kidney disease: Secondary | ICD-10-CM | POA: Diagnosis not present

## 2020-12-05 DIAGNOSIS — R4189 Other symptoms and signs involving cognitive functions and awareness: Secondary | ICD-10-CM | POA: Diagnosis not present

## 2020-12-05 DIAGNOSIS — E1165 Type 2 diabetes mellitus with hyperglycemia: Secondary | ICD-10-CM | POA: Diagnosis not present

## 2020-12-05 DIAGNOSIS — N182 Chronic kidney disease, stage 2 (mild): Secondary | ICD-10-CM | POA: Diagnosis not present

## 2021-01-11 DIAGNOSIS — H401131 Primary open-angle glaucoma, bilateral, mild stage: Secondary | ICD-10-CM | POA: Diagnosis not present

## 2021-01-24 DIAGNOSIS — Z794 Long term (current) use of insulin: Secondary | ICD-10-CM | POA: Diagnosis not present

## 2021-01-24 DIAGNOSIS — N182 Chronic kidney disease, stage 2 (mild): Secondary | ICD-10-CM | POA: Diagnosis not present

## 2021-01-24 DIAGNOSIS — Z7189 Other specified counseling: Secondary | ICD-10-CM | POA: Diagnosis not present

## 2021-01-24 DIAGNOSIS — E1165 Type 2 diabetes mellitus with hyperglycemia: Secondary | ICD-10-CM | POA: Diagnosis not present

## 2021-01-24 DIAGNOSIS — I10 Essential (primary) hypertension: Secondary | ICD-10-CM | POA: Diagnosis not present

## 2021-04-03 DIAGNOSIS — Z23 Encounter for immunization: Secondary | ICD-10-CM | POA: Diagnosis not present

## 2021-04-03 DIAGNOSIS — I129 Hypertensive chronic kidney disease with stage 1 through stage 4 chronic kidney disease, or unspecified chronic kidney disease: Secondary | ICD-10-CM | POA: Diagnosis not present

## 2021-04-03 DIAGNOSIS — N182 Chronic kidney disease, stage 2 (mild): Secondary | ICD-10-CM | POA: Diagnosis not present

## 2021-04-03 DIAGNOSIS — K219 Gastro-esophageal reflux disease without esophagitis: Secondary | ICD-10-CM | POA: Diagnosis not present

## 2021-04-03 DIAGNOSIS — R4189 Other symptoms and signs involving cognitive functions and awareness: Secondary | ICD-10-CM | POA: Diagnosis not present

## 2021-04-03 DIAGNOSIS — Z794 Long term (current) use of insulin: Secondary | ICD-10-CM | POA: Diagnosis not present

## 2021-04-03 DIAGNOSIS — E1165 Type 2 diabetes mellitus with hyperglycemia: Secondary | ICD-10-CM | POA: Diagnosis not present

## 2021-05-10 DIAGNOSIS — H401131 Primary open-angle glaucoma, bilateral, mild stage: Secondary | ICD-10-CM | POA: Diagnosis not present

## 2021-05-16 DIAGNOSIS — Z794 Long term (current) use of insulin: Secondary | ICD-10-CM | POA: Diagnosis not present

## 2021-05-16 DIAGNOSIS — E1165 Type 2 diabetes mellitus with hyperglycemia: Secondary | ICD-10-CM | POA: Diagnosis not present

## 2021-05-16 DIAGNOSIS — E785 Hyperlipidemia, unspecified: Secondary | ICD-10-CM | POA: Diagnosis not present

## 2021-05-16 DIAGNOSIS — I10 Essential (primary) hypertension: Secondary | ICD-10-CM | POA: Diagnosis not present

## 2021-05-16 DIAGNOSIS — N182 Chronic kidney disease, stage 2 (mild): Secondary | ICD-10-CM | POA: Diagnosis not present

## 2021-07-31 DIAGNOSIS — N182 Chronic kidney disease, stage 2 (mild): Secondary | ICD-10-CM | POA: Diagnosis not present

## 2021-07-31 DIAGNOSIS — E1165 Type 2 diabetes mellitus with hyperglycemia: Secondary | ICD-10-CM | POA: Diagnosis not present

## 2021-07-31 DIAGNOSIS — Z794 Long term (current) use of insulin: Secondary | ICD-10-CM | POA: Diagnosis not present

## 2021-07-31 DIAGNOSIS — I10 Essential (primary) hypertension: Secondary | ICD-10-CM | POA: Diagnosis not present

## 2021-08-09 DIAGNOSIS — H401122 Primary open-angle glaucoma, left eye, moderate stage: Secondary | ICD-10-CM | POA: Diagnosis not present

## 2021-08-09 DIAGNOSIS — H2513 Age-related nuclear cataract, bilateral: Secondary | ICD-10-CM | POA: Diagnosis not present

## 2021-08-09 DIAGNOSIS — H524 Presbyopia: Secondary | ICD-10-CM | POA: Diagnosis not present

## 2021-08-09 DIAGNOSIS — E119 Type 2 diabetes mellitus without complications: Secondary | ICD-10-CM | POA: Diagnosis not present

## 2021-09-12 DIAGNOSIS — I10 Essential (primary) hypertension: Secondary | ICD-10-CM | POA: Diagnosis not present

## 2021-09-12 DIAGNOSIS — Z794 Long term (current) use of insulin: Secondary | ICD-10-CM | POA: Diagnosis not present

## 2021-09-12 DIAGNOSIS — N182 Chronic kidney disease, stage 2 (mild): Secondary | ICD-10-CM | POA: Diagnosis not present

## 2021-09-12 DIAGNOSIS — E1165 Type 2 diabetes mellitus with hyperglycemia: Secondary | ICD-10-CM | POA: Diagnosis not present

## 2021-09-12 DIAGNOSIS — E785 Hyperlipidemia, unspecified: Secondary | ICD-10-CM | POA: Diagnosis not present

## 2021-09-25 DIAGNOSIS — H401132 Primary open-angle glaucoma, bilateral, moderate stage: Secondary | ICD-10-CM | POA: Diagnosis not present

## 2021-10-01 ENCOUNTER — Encounter (HOSPITAL_COMMUNITY): Payer: Self-pay

## 2021-10-01 ENCOUNTER — Emergency Department (HOSPITAL_COMMUNITY): Payer: Medicare Other

## 2021-10-01 ENCOUNTER — Inpatient Hospital Stay (HOSPITAL_COMMUNITY)
Admission: EM | Admit: 2021-10-01 | Discharge: 2021-10-02 | DRG: 951 | Disposition: A | Discharge status: Hospice | Payer: Medicare Other | Attending: Internal Medicine | Admitting: Internal Medicine

## 2021-10-01 ENCOUNTER — Other Ambulatory Visit: Payer: Self-pay

## 2021-10-01 DIAGNOSIS — Z66 Do not resuscitate: Secondary | ICD-10-CM | POA: Diagnosis not present

## 2021-10-01 DIAGNOSIS — I10 Essential (primary) hypertension: Secondary | ICD-10-CM | POA: Diagnosis present

## 2021-10-01 DIAGNOSIS — Z90710 Acquired absence of both cervix and uterus: Secondary | ICD-10-CM | POA: Diagnosis not present

## 2021-10-01 DIAGNOSIS — Z85028 Personal history of other malignant neoplasm of stomach: Secondary | ICD-10-CM | POA: Diagnosis not present

## 2021-10-01 DIAGNOSIS — Z794 Long term (current) use of insulin: Secondary | ICD-10-CM

## 2021-10-01 DIAGNOSIS — Z79899 Other long term (current) drug therapy: Secondary | ICD-10-CM

## 2021-10-01 DIAGNOSIS — I69318 Other symptoms and signs involving cognitive functions following cerebral infarction: Secondary | ICD-10-CM | POA: Diagnosis not present

## 2021-10-01 DIAGNOSIS — R4182 Altered mental status, unspecified: Secondary | ICD-10-CM | POA: Diagnosis not present

## 2021-10-01 DIAGNOSIS — R4701 Aphasia: Secondary | ICD-10-CM | POA: Diagnosis present

## 2021-10-01 DIAGNOSIS — G9341 Metabolic encephalopathy: Secondary | ICD-10-CM | POA: Diagnosis present

## 2021-10-01 DIAGNOSIS — I69351 Hemiplegia and hemiparesis following cerebral infarction affecting right dominant side: Secondary | ICD-10-CM | POA: Diagnosis not present

## 2021-10-01 DIAGNOSIS — N179 Acute kidney failure, unspecified: Secondary | ICD-10-CM | POA: Diagnosis present

## 2021-10-01 DIAGNOSIS — R404 Transient alteration of awareness: Secondary | ICD-10-CM | POA: Diagnosis not present

## 2021-10-01 DIAGNOSIS — F0390 Unspecified dementia without behavioral disturbance: Secondary | ICD-10-CM | POA: Diagnosis not present

## 2021-10-01 DIAGNOSIS — R1310 Dysphagia, unspecified: Secondary | ICD-10-CM | POA: Diagnosis present

## 2021-10-01 DIAGNOSIS — Z515 Encounter for palliative care: Secondary | ICD-10-CM | POA: Diagnosis not present

## 2021-10-01 DIAGNOSIS — I6529 Occlusion and stenosis of unspecified carotid artery: Secondary | ICD-10-CM | POA: Diagnosis not present

## 2021-10-01 DIAGNOSIS — I639 Cerebral infarction, unspecified: Secondary | ICD-10-CM | POA: Diagnosis not present

## 2021-10-01 DIAGNOSIS — G936 Cerebral edema: Secondary | ICD-10-CM | POA: Diagnosis present

## 2021-10-01 DIAGNOSIS — E785 Hyperlipidemia, unspecified: Secondary | ICD-10-CM | POA: Diagnosis present

## 2021-10-01 DIAGNOSIS — G8191 Hemiplegia, unspecified affecting right dominant side: Secondary | ICD-10-CM | POA: Diagnosis present

## 2021-10-01 DIAGNOSIS — I69320 Aphasia following cerebral infarction: Secondary | ICD-10-CM | POA: Diagnosis not present

## 2021-10-01 DIAGNOSIS — Z7189 Other specified counseling: Secondary | ICD-10-CM | POA: Diagnosis not present

## 2021-10-01 DIAGNOSIS — F03918 Unspecified dementia, unspecified severity, with other behavioral disturbance: Secondary | ICD-10-CM | POA: Diagnosis present

## 2021-10-01 DIAGNOSIS — Z903 Acquired absence of stomach [part of]: Secondary | ICD-10-CM

## 2021-10-01 DIAGNOSIS — Z7985 Long-term (current) use of injectable non-insulin antidiabetic drugs: Secondary | ICD-10-CM | POA: Diagnosis not present

## 2021-10-01 DIAGNOSIS — Z9049 Acquired absence of other specified parts of digestive tract: Secondary | ICD-10-CM | POA: Diagnosis not present

## 2021-10-01 DIAGNOSIS — Z6838 Body mass index (BMI) 38.0-38.9, adult: Secondary | ICD-10-CM | POA: Diagnosis not present

## 2021-10-01 DIAGNOSIS — E119 Type 2 diabetes mellitus without complications: Secondary | ICD-10-CM | POA: Diagnosis present

## 2021-10-01 DIAGNOSIS — K219 Gastro-esophageal reflux disease without esophagitis: Secondary | ICD-10-CM | POA: Diagnosis not present

## 2021-10-01 DIAGNOSIS — E669 Obesity, unspecified: Secondary | ICD-10-CM | POA: Diagnosis present

## 2021-10-01 DIAGNOSIS — E1165 Type 2 diabetes mellitus with hyperglycemia: Secondary | ICD-10-CM

## 2021-10-01 DIAGNOSIS — I6602 Occlusion and stenosis of left middle cerebral artery: Secondary | ICD-10-CM | POA: Diagnosis not present

## 2021-10-01 DIAGNOSIS — I69391 Dysphagia following cerebral infarction: Secondary | ICD-10-CM | POA: Diagnosis not present

## 2021-10-01 DIAGNOSIS — R29810 Facial weakness: Secondary | ICD-10-CM | POA: Diagnosis present

## 2021-10-01 DIAGNOSIS — I63512 Cerebral infarction due to unspecified occlusion or stenosis of left middle cerebral artery: Secondary | ICD-10-CM | POA: Diagnosis present

## 2021-10-01 DIAGNOSIS — I6523 Occlusion and stenosis of bilateral carotid arteries: Secondary | ICD-10-CM | POA: Diagnosis not present

## 2021-10-01 DIAGNOSIS — Z743 Need for continuous supervision: Secondary | ICD-10-CM | POA: Diagnosis not present

## 2021-10-01 DIAGNOSIS — J342 Deviated nasal septum: Secondary | ICD-10-CM | POA: Diagnosis not present

## 2021-10-01 DIAGNOSIS — R410 Disorientation, unspecified: Secondary | ICD-10-CM | POA: Diagnosis not present

## 2021-10-01 DIAGNOSIS — G319 Degenerative disease of nervous system, unspecified: Secondary | ICD-10-CM | POA: Diagnosis not present

## 2021-10-01 LAB — COMPREHENSIVE METABOLIC PANEL
ALT: 10 U/L (ref 0–44)
AST: 19 U/L (ref 15–41)
Albumin: 3.1 g/dL — ABNORMAL LOW (ref 3.5–5.0)
Alkaline Phosphatase: 98 U/L (ref 38–126)
Anion gap: 11 (ref 5–15)
BUN: 18 mg/dL (ref 8–23)
CO2: 18 mmol/L — ABNORMAL LOW (ref 22–32)
Calcium: 8.7 mg/dL — ABNORMAL LOW (ref 8.9–10.3)
Chloride: 107 mmol/L (ref 98–111)
Creatinine, Ser: 1.24 mg/dL — ABNORMAL HIGH (ref 0.44–1.00)
GFR, Estimated: 46 mL/min — ABNORMAL LOW (ref >60)
Glucose, Bld: 205 mg/dL — ABNORMAL HIGH (ref 70–99)
Potassium: 4.3 mmol/L (ref 3.5–5.1)
Sodium: 136 mmol/L (ref 135–145)
Total Bilirubin: 0.3 mg/dL (ref 0.3–1.2)
Total Protein: 6.2 g/dL — ABNORMAL LOW (ref 6.5–8.1)

## 2021-10-01 LAB — CBC
HCT: 42 % (ref 36.0–46.0)
Hemoglobin: 12.8 g/dL (ref 12.0–15.0)
MCH: 29.1 pg (ref 26.0–34.0)
MCHC: 30.5 g/dL (ref 30.0–36.0)
MCV: 95.5 fL (ref 80.0–100.0)
Platelets: 268 10*3/uL (ref 150–400)
RBC: 4.4 MIL/uL (ref 3.87–5.11)
RDW: 13.2 % (ref 11.5–15.5)
WBC: 7.5 10*3/uL (ref 4.0–10.5)
nRBC: 0 % (ref 0.0–0.2)

## 2021-10-01 LAB — DIFFERENTIAL
Abs Immature Granulocytes: 0.02 10*3/uL (ref 0.00–0.07)
Basophils Absolute: 0.1 10*3/uL (ref 0.0–0.1)
Basophils Relative: 1 %
Eosinophils Absolute: 0 10*3/uL (ref 0.0–0.5)
Eosinophils Relative: 0 %
Immature Granulocytes: 0 %
Lymphocytes Relative: 20 %
Lymphs Abs: 1.5 10*3/uL (ref 0.7–4.0)
Monocytes Absolute: 0.6 10*3/uL (ref 0.1–1.0)
Monocytes Relative: 7 %
Neutro Abs: 5.4 10*3/uL (ref 1.7–7.7)
Neutrophils Relative %: 72 %

## 2021-10-01 LAB — I-STAT CHEM 8, ED
BUN: 22 mg/dL (ref 8–23)
Calcium, Ion: 1.04 mmol/L — ABNORMAL LOW (ref 1.15–1.40)
Chloride: 103 mmol/L (ref 98–111)
Creatinine, Ser: 1.1 mg/dL — ABNORMAL HIGH (ref 0.44–1.00)
Glucose, Bld: 196 mg/dL — ABNORMAL HIGH (ref 70–99)
HCT: 40 % (ref 36.0–46.0)
Hemoglobin: 13.6 g/dL (ref 12.0–15.0)
Potassium: 4.3 mmol/L (ref 3.5–5.1)
Sodium: 139 mmol/L (ref 135–145)
TCO2: 23 mmol/L (ref 22–32)

## 2021-10-01 LAB — PROTIME-INR
INR: 1 (ref 0.8–1.2)
Prothrombin Time: 12.6 seconds (ref 11.4–15.2)

## 2021-10-01 LAB — APTT: aPTT: 24 seconds (ref 24–36)

## 2021-10-01 MED ORDER — LORAZEPAM 2 MG/ML PO CONC: 1.0000 mg | ORAL | Status: DC | PRN

## 2021-10-01 MED ORDER — LORAZEPAM 2 MG/ML IJ SOLN: 1.0000 mg | INTRAMUSCULAR | Status: DC | PRN

## 2021-10-01 MED ORDER — HYDROMORPHONE HCL 1 MG/ML IJ SOLN: 0.5000 mg | INTRAMUSCULAR | Status: DC | PRN

## 2021-10-01 MED ORDER — SODIUM CHLORIDE 0.9 % IV SOLN: INTRAVENOUS | Status: DC

## 2021-10-01 MED ORDER — SODIUM CHLORIDE 0.9% FLUSH: 3.0000 mL | Freq: Once | INTRAVENOUS | Status: DC

## 2021-10-01 MED ORDER — ACETAMINOPHEN 325 MG PO TABS: 650.0000 mg | ORAL_TABLET | Freq: Four times a day (QID) | ORAL | Status: DC | PRN

## 2021-10-01 MED ORDER — LORAZEPAM 1 MG PO TABS: 1.0000 mg | ORAL_TABLET | ORAL | Status: DC | PRN

## 2021-10-01 MED ORDER — SODIUM CHLORIDE 0.9% FLUSH
3.0000 mL | Freq: Two times a day (BID) | INTRAVENOUS | Status: DC
  Administered 2021-10-02 (×2): 3 mL via INTRAVENOUS

## 2021-10-01 MED ORDER — ONDANSETRON 4 MG PO TBDP: 4.0000 mg | ORAL_TABLET | Freq: Four times a day (QID) | ORAL | Status: DC | PRN

## 2021-10-01 MED ORDER — ONDANSETRON HCL 4 MG/2ML IJ SOLN
4.0000 mg | Freq: Four times a day (QID) | INTRAMUSCULAR | Status: DC | PRN
  Administered 2021-10-02: 4 mg via INTRAVENOUS
  Filled 2021-10-01 (×2): qty 2

## 2021-10-01 MED ORDER — ACETAMINOPHEN 650 MG RE SUPP: 650.0000 mg | Freq: Four times a day (QID) | RECTAL | Status: DC | PRN

## 2021-10-01 MED ORDER — OXYCODONE HCL 5 MG PO TABS: 5.0000 mg | ORAL_TABLET | ORAL | Status: DC | PRN

## 2021-10-01 MED ORDER — IOHEXOL 350 MG/ML SOLN
100.0000 mL | Freq: Once | INTRAVENOUS | Status: AC | PRN
  Administered 2021-10-01: 100 mL via INTRAVENOUS

## 2021-10-01 MED ORDER — BIOTENE DRY MOUTH MT LIQD: 15.0000 mL | OROMUCOSAL | Status: DC | PRN

## 2021-10-01 MED ORDER — INSULIN ASPART 100 UNIT/ML IJ SOLN
0.0000 [IU] | INTRAMUSCULAR | Status: DC
  Administered 2021-10-02 (×2): 3 [IU] via SUBCUTANEOUS

## 2021-10-01 MED ORDER — POLYVINYL ALCOHOL 1.4 % OP SOLN
1.0000 [drp] | Freq: Four times a day (QID) | OPHTHALMIC | Status: DC | PRN
  Filled 2021-10-01: qty 15

## 2021-10-01 NOTE — Consult Note (Addendum)
Neurology Consultation ?Reason for Consult: Right-sided weakness ?Referring Physician: Vanita Panda, R ? ?CC: Right-sided weakness ? ?History is obtained from: Chart review ? ?HPI: Angela Harvey is a 71 y.o. female with a history of hypertension and diabetes as well as moderate to advanced dementia who presents with right-sided weakness and aphasia.  She was last in her normal state of health at 1 PM when she laid down for a nap.  When she awoke, she was initially able to ambulate but was very aphasic, subsequently as she remained standing, her symptoms worsened and she was brought into the emergency department as a code stroke.   ? ? ?LKW: 1 PM ?tpa given?: no, outside of window ? ? ?ROS: Unable to obtain due to altered mental status.  ? ?Past Medical History:  ?Diagnosis Date  ? Anemia   ? GERD (gastroesophageal reflux disease)   ? Hiatal hernia   ? History of acute pancreatitis 07/2014  ? History of colon polyps   ? History of gastric cancer   ? 1988  resection stomach tumor -- malignant, negative margins and nodes,  no chemoradiation-- no recurrence per pt  ? Hypertension   ? Left ureteral stone   ? Type 2 diabetes mellitus treated with insulin ([place])   ? ? ? ?Family History  ?Problem Relation Age of Onset  ? Renal cancer Neg Hx   ? Renal Disease Neg Hx   ? ? ? ?Social History:  reports that she has never smoked. She has never used smokeless tobacco. She reports that she does not drink alcohol and does not use drugs. ? ? ?Exam: ?Current vital signs: ?Wt 102.6 kg   BMI 38.83 kg/m?  ?Vital signs in last 24 hours: ?Weight:  [102.6 kg] 102.6 kg (04/23 2031) ? ? ?Physical Exam  ?Constitutional: Appears well-developed and well-nourished.  ?Psych: Affect appropriate to situation ?Eyes: No scleral injection ?HENT: No OP obstruction ?MSK: no joint deformities.  ?Cardiovascular: Normal rate and regular rhythm.  ?Respiratory: Effort normal, non-labored breathing ?GI: Soft.  No distension. There is no tenderness.  ?Skin:  WDI ? ?Neuro: ?Mental Status: ?Patient is awake, alert, she is densely aphasic. ?Cranial Nerves: ?II: She is a right hemianopia. Pupils are equal, round, and reactive to light.   ?III,IV, VI: She has a left gaze preference ?VII: Facial movement with right facial weakness ?Motor: ?She has dense right hemiparesis, moves left side well ?Sensory: ?She grimaces more on the left than right ?Cerebellar: ?She does not perform ? ? ? ? ?I have reviewed labs in epic and the results pertinent to this consultation are: ?Creatinine 1.24 ? ?I have reviewed the images obtained: CT perfusion-large left MCA infarct with larger area of penumbra. ? ?Impression: 71 year old female with rapidly progressing left MCA infarct.  Given the rapidity from which her symptoms progressed coupled to the degree of infarct that she has on CT perfusion, I suspect that she has an extremely rapid progress her.  She has almost no collateralization visible on CTA.  Even though her core infarct is 41, which is typically less than the 70 that we use as a cutoff to determine eligibility for intervention, given the lack of collateralization, rapid progression, I think that the likelihood of her having a good outcome from embolectomy is exceedingly low.  I discussed this with the family who expressed understanding.  Given that even with aggressive intervention, she would have severe deficits superimposed on her underlying dementia, after discussion with the family they would like to proceed  with family comfort care.  I do think that repeating a head CT in the morning to gauge the degree of edema to be expected would be helpful in counseling family and so I do think it makes sense to do this. ? ?I discussed with the family not pursuing any life prolonging measures such as hyperosmolar therapy, aggressive surgeries, or interventions required generalized seizure.  I also discussed not performing CPR or breathing tube with family.  They expressed that holding off  on these interventions was in keeping with what they would expect the wishes of the patient to be. ? ?Recommendations: ?1) patient is made DNR ?2) repeat head CT in the morning ?3) stroke team to follow ? ?This patient is critically ill and at significant risk of neurological worsening, death and care requires constant monitoring of vital signs, hemodynamics,respiratory and cardiac monitoring, neurological assessment, discussion with family, other specialists and medical decision making of high complexity. I spent 50 minutes of neurocritical care time  in the care of  this patient. This was time spent independent of any time provided by nurse practitioner or PA. ? ?Roland Rack, MD ?[facility] ?[phone] ? ?If 7pm- 7am, please page neurology on call as listed in [place]. ?10/01/2021  9:33 PM ? ? ?

## 2021-10-01 NOTE — H&P (Signed)
?History and Physical  ? ?Angela Harvey OVF:[id number] DOB: [birth year] DOA: 10/01/2021 ? ?PCP: Burnard Bunting, MD  ? ?Patient coming from: Home ? ?Chief Complaint: Neurologic deficits ? ?HPI: Angela Harvey is a 71 y.o. female with medical history significant of dementia, hypertension, diabetes, GERD, malnutrition, anemia, gastric cancer presenting with slurred speech, facial droop, aphasia. ? ?History obtained assistance of family and chart review.  Patient reportedly was last seen at her baseline around 1 PM when she went to take a nap.  She woke up at 7:30 PM with symptoms.  Initially consisting of slurred speech and facial droop with aphasia.  Symptoms continue to progress and EMS was called.  Patient arrived as a code stroke. ? ?By the time patient arrived noted to have dense aphasia and dense right hemiparesis, due to progressive nature of stroke and time since initial symptoms patient was thought to be very unlikely to have a good outcome with tPA.  Neurologist discussed this case with the patient's family as per their consult note and decision was made based on her prognosis not to proceed with any intervention and to proceed with comfort measures only.  Patient has been made DNR/DNI. ? ?Patient unable to participate in review of systems ? ?ED Course: Vital signs in the ED stable.  Lab work included CMP with bicarb 18, creatinine 1.24, glucose 205, calcium 8.7, protein 6.2, albumin 3.1.  CBC within normal limits, PT, PTT, INR within normal limits.  CT head showed no acute normality.  CT a head and neck with CT perfusion study showing complete occlusion of the left MCA at origin with large infarct of the left MCA territory and area of ischemic penumbra.  As above neurology consulted in the ED. ? ?Review of Systems: Patient unable to participate in review of systems due to severity of stroke. ? ?Past Medical History:  ?Diagnosis Date  ? Anemia   ? GERD (gastroesophageal reflux disease)   ? Hiatal hernia   ?  History of acute pancreatitis 07/2014  ? History of colon polyps   ? History of gastric cancer   ? 1988  resection stomach tumor -- malignant, negative margins and nodes,  no chemoradiation-- no recurrence per pt  ? Hypertension   ? Left ureteral stone   ? Type 2 diabetes mellitus treated with insulin ([place])   ? ? ?Past Surgical History:  ?Procedure Laterality Date  ? CHOLECYSTECTOMY N/A 08/16/2014  ? Procedure: LAPAROSCOPIC CHOLECYSTECTOMY WITH INTRAOPERATIVE CHOLANGIOGRAM;  Surgeon: Kaylyn Lim, MD;  Location: [facility];  Service: General;  Laterality: N/A;  ? CYSTOSCOPY W/ URETERAL STENT PLACEMENT Left 04/28/2016  ? Procedure: CYSTOSCOPY WITH RETROGRADE PYELOGRAM/URETERAL STENT PLACEMENT;  Surgeon: Kathie Rhodes, MD;  Location: [facility];  Service: Urology;  Laterality: Left;  ? CYSTOSCOPY WITH RETROGRADE PYELOGRAM, URETEROSCOPY AND STENT PLACEMENT Left 06/15/2016  ? Procedure: CYSTOSCOPY STENT REMOVAL  LEFT RETROGRADE PYELOGRAM, URETEROSCOPY, STONE BASKETRY;  Surgeon: Kathie Rhodes, MD;  Location: [facility];  Service: Urology;  Laterality: Left;  ? [place]  ? malignant tumor  ? UMBILICAL HERNIA REPAIR  1970's  ? VAGINAL HYSTERECTOMY  1980's  ? ? ?Social History ? reports that she has never smoked. She has never used smokeless tobacco. She reports that she does not drink alcohol and does not use drugs. ? ?No Known Allergies ? ?Family History  ?Problem Relation Age of Onset  ? Renal cancer Neg Hx   ? Renal Disease Neg Hx   ?Reviewed on admission ? ?  Prior to Admission medications   ?Medication Sig Start Date End Date Taking? Authorizing Provider  ?acetaminophen (TYLENOL) 500 MG tablet Take 1,000 mg by mouth every 6 (six) hours as needed for moderate pain.    [provider]  ?amLODipine (NORVASC) 10 MG tablet Take 1 tablet (10 mg total) by mouth daily. 04/25/18   Oretha Milch D, MD  ?Esomeprazole Magnesium (NEXIUM PO) Take 1 tablet by mouth 2 (two) times daily.     [provider]  ?guaiFENesin-dextromethorphan (ROBITUSSIN DM) 100-10 MG/5ML syrup Take 5 mLs by mouth every 4 (four) hours as needed for cough. 04/25/18   Desiree Hane, MD  ?losartan (COZAAR) 50 MG tablet Take 1 tablet (50 mg total) by mouth daily. ?Patient taking differently: Take 50 mg by mouth 2 (two) times daily. 04/25/18   Desiree Hane, MD  ?memantine (NAMENDA XR) 28 MG CP24 24 hr capsule Take 1 capsule (28 mg total) by mouth daily. 04/25/18   Desiree Hane, MD  ?Multiple Vitamin (MULTIVITAMIN WITH MINERALS) TABS tablet Take 1 tablet by mouth daily. 04/25/18   Desiree Hane, MD  ?NOVOLOG FLEXPEN 100 UNIT/ML FlexPen Inject 5 Units into the skin 3 (three) times daily before meals. Start using when sugar is more than 160 or geater 10/15/20   Arrien, Jimmy Picket, MD  ?Phenylephrine HCl (AFRIN ALLERGY NA) Place 2 sprays into both nostrils daily as needed (ALLERGIES).    [provider]  ?Propylene Glycol (SYSTANE BALANCE OP) Place 1 drop into both eyes daily as needed (dry eyes).    [provider]  ?TRESIBA FLEXTOUCH 200 UNIT/ML FlexTouch Pen Inject 20 Units into the skin every morning. Start using when sugar 160 or greater. 10/15/20   Arrien, Jimmy Picket, MD  ? ? ?Physical Exam: ?Vitals:  ? 10/01/21 2031 10/01/21 2059 10/01/21 2145  ?BP:  (!) 112/52 120/62  ?Pulse:  85 86  ?Resp:   18  ?Temp:  97.8 ?F (36.6 ?C)   ?TempSrc:  Axillary   ?SpO2:  95% 95%  ?Weight: 102.6 kg    ? ? ?Physical Exam ?Constitutional:   ?   General: She is not in acute distress. ?   Appearance: Normal appearance.  ?HENT:  ?   Head: Normocephalic and atraumatic.  ?   Mouth/Throat:  ?   Mouth: Mucous membranes are moist.  ?   Pharynx: Oropharynx is clear.  ?Eyes:  ?   Extraocular Movements: Extraocular movements intact.  ?   Pupils: Pupils are equal, round, and reactive to light.  ?Cardiovascular:  ?   Rate and Rhythm: Normal rate and regular rhythm.  ?   Pulses: Normal pulses.  ?   Heart sounds:  Normal heart sounds.  ?Pulmonary:  ?   Effort: Pulmonary effort is normal. No respiratory distress.  ?   Breath sounds: Normal breath sounds.  ?Abdominal:  ?   General: Bowel sounds are normal. There is no distension.  ?   Palpations: Abdomen is soft.  ?   Tenderness: There is no abdominal tenderness.  ?Musculoskeletal:     ?   General: No swelling or deformity.  ?Skin: ?   General: Skin is warm and dry.  ?Neurological:  ?   Comments: Opens eyes but is densely aphasic ?Dense right-sided hemiparesis, can move left side  ? ?Labs on Admission: I have personally reviewed following labs and imaging studies ? ?CBC: ?Recent Labs  ?Lab 10/01/21 ?2032 10/01/21 ?2034  ?WBC 7.5  --   ?NEUTROABS 5.4  --   ?  HGB 12.8 13.6  ?HCT 42.0 40.0  ?MCV 95.5  --   ?PLT 268  --   ? ? ?Basic Metabolic Panel: ?Recent Labs  ?Lab 10/01/21 ?2032 10/01/21 ?2034  ?NA 136 139  ?K 4.3 4.3  ?CL 107 103  ?CO2 18*  --   ?GLUCOSE 205* 196*  ?BUN 18 22  ?CREATININE 1.24* 1.10*  ?CALCIUM 8.7*  --   ? ? ?GFR: ?CrCl cannot be calculated (Unknown ideal weight.). ? ?Liver Function Tests: ?Recent Labs  ?Lab 10/01/21 ?2032  ?AST 19  ?ALT 10  ?ALKPHOS 98  ?BILITOT 0.3  ?PROT 6.2*  ?ALBUMIN 3.1*  ? ? ?Urine analysis: ?   ?Component Value Date/Time  ? COLORURINE YELLOW 10/12/2020 2200  ? APPEARANCEUR CLOUDY (A) 10/12/2020 2200  ? LABSPEC 1.016 10/12/2020 2200  ? PHURINE 7.0 10/12/2020 2200  ? GLUCOSEU 150 (A) 10/12/2020 2200  ? HGBUR SMALL (A) 10/12/2020 2200  ? [place] NEGATIVE 10/12/2020 2200  ? [place] NEGATIVE 10/12/2020 2200  ? PROTEINUR NEGATIVE 10/12/2020 2200  ? UROBILINOGEN 0.2 08/08/2014 1505  ? NITRITE NEGATIVE 10/12/2020 2200  ? LEUKOCYTESUR TRACE (A) 10/12/2020 2200  ? ? ?Radiological Exams on Admission: ?CT CEREBRAL PERFUSION W CONTRAST ? ?Addendum Date: 10/01/2021   ?ADDENDUM REPORT: 10/01/2021 21:10 ADDENDUM: Critical Value/emergent results were called by telephone at the time of interpretation on 10/01/2021 at 9:10 pm to provider MCNEILL  [facility] , who verbally acknowledged these results. Electronically Signed   By: Ulyses Jarred M.D.   On: 10/01/2021 21:10  ? ?Result Date: 10/01/2021 ?EXAM: CT ANGIOGRAPHY HEAD AND NECK CT PERFUSION BRAIN TECHN

## 2021-10-01 NOTE — ED Triage Notes (Signed)
Pt LSW at 1300 laid down for nap. Woke up with slurred speech and right sided facxial droop ?

## 2021-10-01 NOTE — Code Documentation (Signed)
Responded to Code Stroke called at 2008 for R facial droop, R sided weakness, L gaze, and aphasia. LSN originally thought to be 1930(this was changed to 1300 after speaking with family. Pt arrived at 2027, NIH-23, CBG-205, CT head-no acute changes, CTA/CTP-complete occlusion of the L MCA at its origin, large L MCA territory infarct and large area of ischemic penumbra(penumbra-231cc, core infarct-65cc). After MD discussion with family, plan for DNR/comfort are.  ?

## 2021-10-01 NOTE — ED Provider Notes (Signed)
?[name] ?Provider Note ? ? ?CSN: [id number] ?Arrival date & time: 10/01/21  2027 ? ?An emergency department physician performed an initial assessment on this suspected stroke patient at 2029. ? ?History ? ?Chief Complaint  ?Patient presents with  ? Code Stroke  ? ? ?Angela Harvey is a 71 y.o. female.  Patient presents to the emergency department as a code stroke. Patient was last known normal at 1pm. Patient awoke at approximately 7:30, was initially able to move but was aphasic, but as she continued symptoms worsened and EMS was called. PMH significant for hypertension, diabetes, moderate to advanced dementia ? ?HPI ? ?  ? ?Home Medications ?Prior to Admission medications   ?Medication Sig Start Date End Date Taking? Authorizing Provider  ?acetaminophen (TYLENOL) 500 MG tablet Take 1,000 mg by mouth every 6 (six) hours as needed for moderate pain.    [provider]  ?amLODipine (NORVASC) 10 MG tablet Take 1 tablet (10 mg total) by mouth daily. 04/25/18   Oretha Milch D, MD  ?Esomeprazole Magnesium (NEXIUM PO) Take 1 tablet by mouth 2 (two) times daily.    [provider]  ?guaiFENesin-dextromethorphan (ROBITUSSIN DM) 100-10 MG/5ML syrup Take 5 mLs by mouth every 4 (four) hours as needed for cough. 04/25/18   Desiree Hane, MD  ?losartan (COZAAR) 50 MG tablet Take 1 tablet (50 mg total) by mouth daily. ?Patient taking differently: Take 50 mg by mouth 2 (two) times daily. 04/25/18   Desiree Hane, MD  ?memantine (NAMENDA XR) 28 MG CP24 24 hr capsule Take 1 capsule (28 mg total) by mouth daily. 04/25/18   Desiree Hane, MD  ?Multiple Vitamin (MULTIVITAMIN WITH MINERALS) TABS tablet Take 1 tablet by mouth daily. 04/25/18   Desiree Hane, MD  ?NOVOLOG FLEXPEN 100 UNIT/ML FlexPen Inject 5 Units into the skin 3 (three) times daily before meals. Start using when sugar is more than 160 or geater 10/15/20   Arrien, Jimmy Picket, MD  ?Phenylephrine  HCl (AFRIN ALLERGY NA) Place 2 sprays into both nostrils daily as needed (ALLERGIES).    [provider]  ?Propylene Glycol (SYSTANE BALANCE OP) Place 1 drop into both eyes daily as needed (dry eyes).    [provider]  ?TRESIBA FLEXTOUCH 200 UNIT/ML FlexTouch Pen Inject 20 Units into the skin every morning. Start using when sugar 160 or greater. 10/15/20   Arrien, Jimmy Picket, MD  ?   ? ?Allergies    ?Patient has no known allergies.   ? ?Review of Systems   ?Review of Systems  ?Reason unable to perform ROS: Code Stroke.  ? ?Physical Exam ?Updated Vital Signs ?BP 120/62   Pulse 86   Temp 97.8 ?F (36.6 ?C) (Axillary)   Resp 18   Wt 102.6 kg   SpO2 95%   BMI 38.83 kg/m?  ?Physical Exam ?Constitutional:   ?   Appearance: She is obese.  ?HENT:  ?   Head: Normocephalic and atraumatic.  ?Cardiovascular:  ?   Rate and Rhythm: Normal rate and regular rhythm.  ?   Pulses: Normal pulses.  ?Pulmonary:  ?   Effort: Pulmonary effort is normal.  ?   Breath sounds: Normal breath sounds.  ?Abdominal:  ?   Palpations: Abdomen is soft.  ?Skin: ?   General: Skin is warm and dry.  ?Neurological:  ?   Mental Status: She is alert.  ?   Comments: Mental Status: ?Patient is aphasic ?Cranial Nerves: ?II: PERRL   ?  III,IV, VI: She has a left gaze  ?VII: Right facial weakness ?Motor: ?Right hemiparesis ?  ? ? ?ED Results / Procedures / Treatments   ?Labs ?(all labs ordered are listed, but only abnormal results are displayed) ?Labs Reviewed  ?COMPREHENSIVE METABOLIC PANEL - Abnormal; Notable for the following components:  ?    Result Value  ? CO2 18 (*)   ? Glucose, Bld 205 (*)   ? Creatinine, Ser 1.24 (*)   ? Calcium 8.7 (*)   ? Total Protein 6.2 (*)   ? Albumin 3.1 (*)   ? GFR, Estimated 46 (*)   ? All other components within normal limits  ?I-STAT CHEM 8, ED - Abnormal; Notable for the following components:  ? Creatinine, Ser 1.10 (*)   ? Glucose, Bld 196 (*)   ? Calcium, Ion 1.04 (*)   ? All other components  within normal limits  ?PROTIME-INR  ?APTT  ?CBC  ?DIFFERENTIAL  ?CBG MONITORING, ED  ? ? ?EKG ?EKG Interpretation ? ?Date/Time:  Sunday October 01 2021 20:57:42 EDT ?Ventricular Rate:  90 ?PR Interval:  164 ?QRS Duration: 95 ?QT Interval:  371 ?QTC Calculation: 643 ?R Axis:   -10 ?Text Interpretation: Sinus rhythm Low voltage, precordial leads Abnormal ECG Confirmed by Carmin Muskrat [phone]) on 10/01/2021 9:15:54 PM ? ?Radiology ?CT CEREBRAL PERFUSION W CONTRAST ? ?Addendum Date: 10/01/2021   ?ADDENDUM REPORT: 10/01/2021 21:10 ADDENDUM: Critical Value/emergent results were called by telephone at the time of interpretation on 10/01/2021 at 9:10 pm to provider MCNEILL [facility] , who verbally acknowledged these results. Electronically Signed   By: Ulyses Jarred M.D.   On: 10/01/2021 21:10  ? ?Result Date: 10/01/2021 ?EXAM: CT ANGIOGRAPHY HEAD AND NECK CT PERFUSION BRAIN TECHNIQUE: Multidetector CT imaging of the head and neck was performed using the standard protocol during bolus administration of intravenous contrast. Multiplanar CT image reconstructions and MIPs were obtained to evaluate the vascular anatomy. Carotid stenosis measurements (when applicable) are obtained utilizing NASCET criteria, using the distal internal carotid diameter as the denominator. Multiphase CT imaging of the brain was performed following IV bolus contrast injection. Subsequent parametric perfusion maps were calculated using RAPID software. RADIATION DOSE REDUCTION: This exam was performed according to the departmental dose-optimization program which includes automated exposure control, adjustment of the mA and/or kV according to patient size and/or use of iterative reconstruction technique. CONTRAST:  160m OMNIPAQUE IOHEXOL 350 MG/ML SOLN COMPARISON:  None. FINDINGS: CTA NECK FINDINGS SKELETON: There is no bony spinal canal stenosis. No lytic or blastic lesion. OTHER NECK: Normal pharynx, larynx and major salivary glands. No cervical  lymphadenopathy. Unremarkable thyroid gland. UPPER CHEST: No pneumothorax or pleural effusion. No nodules or masses. AORTIC ARCH: There is no calcific atherosclerosis of the aortic arch. There is no aneurysm, dissection or hemodynamically significant stenosis of the visualized portion of the aorta. Conventional 3 vessel aortic branching pattern. The visualized proximal subclavian arteries are widely patent. RIGHT CAROTID SYSTEM: Normal without aneurysm, dissection or stenosis. LEFT CAROTID SYSTEM: Normal without aneurysm, dissection or stenosis. VERTEBRAL ARTERIES: Left dominant configuration. Both origins are clearly patent. There is no dissection, occlusion or flow-limiting stenosis to the skull base (V1-V3 segments). CTA HEAD FINDINGS POSTERIOR CIRCULATION: --Vertebral arteries: Normal V4 segments. --Inferior cerebellar arteries: Normal. --Basilar artery: Normal. --Superior cerebellar arteries: Normal. --Posterior cerebral arteries (PCA): Normal. ANTERIOR CIRCULATION: --Intracranial internal carotid arteries: Attenuated contrast enhancement within the left ICA at the skull base. Mild calcification on the right. --Anterior cerebral arteries (ACA): Normal. Both A1 segments  are present. Patent anterior communicating artery (a-comm). --Middle cerebral arteries (MCA): Complete occlusion of the left MCA at its origin. No collateralization. VENOUS SINUSES: As permitted by contrast timing, patent. ANATOMIC VARIANTS: None Review of the MIP images confirms the above findings. CT Brain Perfusion Findings: ASPECTS: 10 CBF (<30%) Volume: 77m Perfusion (Tmax>6.0s) volume: 2322mMismatch Volume: 16638mnfarction Location:Left MCA territory IMPRESSION: Complete occlusion of the left MCA at its origin, with large left MCA territory infarct and large area of ischemic penumbra. Electronically Signed: By: KevUlyses JarredD. On: 10/01/2021 20:59  ? ?CT HEAD CODE STROKE WO CONTRAST ? ?Addendum Date: 10/01/2021   ?ADDENDUM REPORT:  10/01/2021 21:10 ADDENDUM: These results were called by telephone at the time of interpretation on 10/01/2021 at 9:10 pm to provider MCNEILL [facility] verb[facility] these results. Electronically Signed   By:

## 2021-10-02 ENCOUNTER — Observation Stay (HOSPITAL_COMMUNITY): Payer: Medicare Other

## 2021-10-02 DIAGNOSIS — Z9049 Acquired absence of other specified parts of digestive tract: Secondary | ICD-10-CM | POA: Diagnosis not present

## 2021-10-02 DIAGNOSIS — N179 Acute kidney failure, unspecified: Secondary | ICD-10-CM | POA: Diagnosis present

## 2021-10-02 DIAGNOSIS — I6529 Occlusion and stenosis of unspecified carotid artery: Secondary | ICD-10-CM | POA: Diagnosis not present

## 2021-10-02 DIAGNOSIS — G319 Degenerative disease of nervous system, unspecified: Secondary | ICD-10-CM | POA: Diagnosis not present

## 2021-10-02 DIAGNOSIS — Z743 Need for continuous supervision: Secondary | ICD-10-CM | POA: Diagnosis not present

## 2021-10-02 DIAGNOSIS — I69320 Aphasia following cerebral infarction: Secondary | ICD-10-CM | POA: Diagnosis not present

## 2021-10-02 DIAGNOSIS — I639 Cerebral infarction, unspecified: Secondary | ICD-10-CM | POA: Diagnosis present

## 2021-10-02 DIAGNOSIS — F03918 Unspecified dementia, unspecified severity, with other behavioral disturbance: Secondary | ICD-10-CM | POA: Diagnosis present

## 2021-10-02 DIAGNOSIS — Z7189 Other specified counseling: Secondary | ICD-10-CM

## 2021-10-02 DIAGNOSIS — Z85028 Personal history of other malignant neoplasm of stomach: Secondary | ICD-10-CM | POA: Diagnosis not present

## 2021-10-02 DIAGNOSIS — I10 Essential (primary) hypertension: Secondary | ICD-10-CM | POA: Diagnosis present

## 2021-10-02 DIAGNOSIS — Z903 Acquired absence of stomach [part of]: Secondary | ICD-10-CM | POA: Diagnosis not present

## 2021-10-02 DIAGNOSIS — I69391 Dysphagia following cerebral infarction: Secondary | ICD-10-CM | POA: Diagnosis not present

## 2021-10-02 DIAGNOSIS — R29810 Facial weakness: Secondary | ICD-10-CM | POA: Diagnosis present

## 2021-10-02 DIAGNOSIS — J342 Deviated nasal septum: Secondary | ICD-10-CM | POA: Diagnosis not present

## 2021-10-02 DIAGNOSIS — K219 Gastro-esophageal reflux disease without esophagitis: Secondary | ICD-10-CM | POA: Diagnosis present

## 2021-10-02 DIAGNOSIS — Z515 Encounter for palliative care: Secondary | ICD-10-CM

## 2021-10-02 DIAGNOSIS — G936 Cerebral edema: Secondary | ICD-10-CM | POA: Diagnosis present

## 2021-10-02 DIAGNOSIS — I63512 Cerebral infarction due to unspecified occlusion or stenosis of left middle cerebral artery: Secondary | ICD-10-CM | POA: Diagnosis present

## 2021-10-02 DIAGNOSIS — Z90710 Acquired absence of both cervix and uterus: Secondary | ICD-10-CM | POA: Diagnosis not present

## 2021-10-02 DIAGNOSIS — Z66 Do not resuscitate: Secondary | ICD-10-CM

## 2021-10-02 DIAGNOSIS — I69351 Hemiplegia and hemiparesis following cerebral infarction affecting right dominant side: Secondary | ICD-10-CM | POA: Diagnosis not present

## 2021-10-02 DIAGNOSIS — R4701 Aphasia: Secondary | ICD-10-CM | POA: Diagnosis present

## 2021-10-02 DIAGNOSIS — G8191 Hemiplegia, unspecified affecting right dominant side: Secondary | ICD-10-CM | POA: Diagnosis present

## 2021-10-02 DIAGNOSIS — E669 Obesity, unspecified: Secondary | ICD-10-CM | POA: Diagnosis present

## 2021-10-02 DIAGNOSIS — I69318 Other symptoms and signs involving cognitive functions following cerebral infarction: Secondary | ICD-10-CM | POA: Diagnosis not present

## 2021-10-02 DIAGNOSIS — Z79899 Other long term (current) drug therapy: Secondary | ICD-10-CM | POA: Diagnosis not present

## 2021-10-02 DIAGNOSIS — G9341 Metabolic encephalopathy: Secondary | ICD-10-CM | POA: Diagnosis present

## 2021-10-02 DIAGNOSIS — Z7985 Long-term (current) use of injectable non-insulin antidiabetic drugs: Secondary | ICD-10-CM | POA: Diagnosis not present

## 2021-10-02 DIAGNOSIS — R404 Transient alteration of awareness: Secondary | ICD-10-CM | POA: Diagnosis not present

## 2021-10-02 DIAGNOSIS — R1310 Dysphagia, unspecified: Secondary | ICD-10-CM | POA: Diagnosis present

## 2021-10-02 DIAGNOSIS — Z794 Long term (current) use of insulin: Secondary | ICD-10-CM | POA: Diagnosis not present

## 2021-10-02 DIAGNOSIS — Z6838 Body mass index (BMI) 38.0-38.9, adult: Secondary | ICD-10-CM | POA: Diagnosis not present

## 2021-10-02 DIAGNOSIS — E119 Type 2 diabetes mellitus without complications: Secondary | ICD-10-CM | POA: Diagnosis present

## 2021-10-02 DIAGNOSIS — F0390 Unspecified dementia without behavioral disturbance: Secondary | ICD-10-CM | POA: Diagnosis not present

## 2021-10-02 DIAGNOSIS — E785 Hyperlipidemia, unspecified: Secondary | ICD-10-CM | POA: Diagnosis present

## 2021-10-02 LAB — GLUCOSE, CAPILLARY
Glucose-Capillary: 137 mg/dL — ABNORMAL HIGH (ref 70–99)
Glucose-Capillary: 139 mg/dL — ABNORMAL HIGH (ref 70–99)
Glucose-Capillary: 153 mg/dL — ABNORMAL HIGH (ref 70–99)
Glucose-Capillary: 156 mg/dL — ABNORMAL HIGH (ref 70–99)
Glucose-Capillary: 161 mg/dL — ABNORMAL HIGH (ref 70–99)

## 2021-10-02 MED ORDER — HALOPERIDOL LACTATE 5 MG/ML IJ SOLN
0.5000 mg | INTRAMUSCULAR | Status: DC | PRN
Start: 2021-10-02 — End: 2021-10-03

## 2021-10-02 MED ORDER — HALOPERIDOL LACTATE 2 MG/ML PO CONC
0.5000 mg | ORAL | Status: DC | PRN
  Filled 2021-10-02: qty 0.3

## 2021-10-02 MED ORDER — HYDROMORPHONE HCL 1 MG/ML IJ SOLN: 0.5000 mg | INTRAMUSCULAR | Status: DC | PRN

## 2021-10-02 MED ORDER — GLYCOPYRROLATE 0.2 MG/ML IJ SOLN: 0.2000 mg | INTRAMUSCULAR | Status: DC | PRN

## 2021-10-02 MED ORDER — ACETAMINOPHEN 650 MG RE SUPP: 650.0000 mg | Freq: Four times a day (QID) | RECTAL | Status: DC | PRN

## 2021-10-02 MED ORDER — GLYCOPYRROLATE 1 MG PO TABS: 1.0000 mg | ORAL_TABLET | ORAL | Status: DC | PRN

## 2021-10-02 MED ORDER — HALOPERIDOL 0.5 MG PO TABS: 0.5000 mg | ORAL_TABLET | ORAL | Status: DC | PRN

## 2021-10-02 NOTE — Consult Note (Signed)
? ?Palliative Care Consult Note  ?                                ?Date: 10/02/2021  ? ?Patient Name: Angela Harvey  ?DOB: 05-Nov-1950  MRN: [id number]  Age / Sex: 71 y.o., female  ?PCP: Burnard Bunting, MD ?Referring Physician: Barb Merino, MD ? ?Reason for Consultation: Establishing goals of care and Terminal Care ? ?HPI/Patient Profile: 71 y.o. female  with past medical history of advanced dementia, hypertension, type 2 diabetes, GERD, gastric cancer presented to the ER with slurred speech, facial droop and aphasia.  Lives at home and has advance dementia.  In the emergency room she was found to have dense aphasia and dense right hemiplegia.  Due to progressive nature of stroke and time since initial symptoms patient was unlikely to have good outcome with tPA.  Seen by neurologist, admitted for symptom control with discussion for comfort care. ? ?PMT was consulted for [place]  conversation and assistance with comfort care. ? ?Past Medical History:  ?Diagnosis Date  ? Anemia   ? GERD (gastroesophageal reflux disease)   ? Hiatal hernia   ? History of acute pancreatitis 07/2014  ? History of colon polyps   ? History of gastric cancer   ? 1988  resection stomach tumor -- malignant, negative margins and nodes,  no chemoradiation-- no recurrence per pt  ? Hypertension   ? Left ureteral stone   ? Type 2 diabetes mellitus treated with insulin ([place])   ? ? ?Subjective:  ? ?This NP Walden Field reviewed medical records, received report from team, assessed the patient and then meet at the patient's bedside to discuss diagnosis, prognosis, GOC, EOL wishes disposition and options. ? ?I met with the patient's husband Sonia Side, the patient's Sister Jocelyn Lamer, and a family friend at the bedside.  About halfway into the discussion we were joined by the stroke team who provided valuable clinical insight. ?  ?Concept of Palliative Care was introduced as specialized medical care for people and  their families living with serious illness.  If focuses on providing relief from the symptoms and stress of a serious illness.  The goal is to improve quality of life for both the patient and the family. Values and goals of care important to patient and family were attempted to be elicited. ? ?Created space and opportunity for patient  and family to explore thoughts and feelings regarding current medical situation ?  ?Natural trajectory and current clinical status were discussed. Questions and concerns addressed. Patient  encouraged to call with questions or concerns.   ? ?Patient/Family Understanding of Illness: ?They understand that she took a nap yesterday afternoon, she was woken up for dinner and when she came to the dining room was confused/having issues with communication understanding, facial droop, and toppling to the right.  They brought her into the emergency room and they were seen by neurology after imaging and told that she had a disastrous brain damage status post large CVA.  They were told that even with heroic measures, on the slim chance she would survive, she would have significant life changes and long term debility. ? ?At baseline she has had dementia for the past 3 to 4 years, was started on Namenda about 4 years ago.  They do have a home caregiver.  She has had some decline over the past few years related to her dementia to the point that she  persistently asks about her deceased parents, she is always packing up stuff "to go home" and occasionally not recognizing her husband in person or in pictures.  She is independent with most ADLs however. ? 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  She is married to her husband Sonia Side and they have 2 sons. ? ?Patient Values: ?Deferred ? ?Goals: ?Comfort care ? ?Today's Discussion: ?We had a extensive discussion along with the stroke team to  explain the extent of her brain damage from the stroke, likelihood of oncoming swelling in the coming days, unlikely to survive.  She is already on comfort care.  We explained what this means and the use of medications and other therapies to focus on comfort, quality, dignity rather than attempts to aggressively fix something that is likely not fixable.  We helped framed this in the understanding of she has had progressive dementia at baseline and even if she had a "miraculous recovery" that she would continue to decline with her dementia.  Her husband shares that she would not want this. ? ?We had a discussion about hospice philosophy and hospice is an option for end-of-life care.  After a thorough discussion they have elected residential hospice.  I feel she meets criteria because of her n.p.o. status due to large stroke aphasic state and pending brain swelling from the CVA damage.  They have requested placement at [facility] in [place] due to it being closer to their home, although they are open to a bed in [place] if it is available and [facility] has no beds. ? ?I provided emotional general support therapeutic listening, empathy, sharing of stories, and other techniques.  I answered all questions and addressed all concerns to the best my ability. ? ?Review of Systems  ?Unable to perform ROS: Patient nonverbal  ? ?Objective:  ? ?Primary Diagnoses: ?Present on Admission: ? Essential hypertension ? GERD (gastroesophageal reflux disease) ? Dementia with behavioral disturbance ([place]) ? Acute CVA (cerebrovascular accident) ([place]) ? ? ?Physical Exam ?Vitals and nursing note reviewed.  ?Constitutional:   ?   General: She is sleeping. She is not in acute distress. ?HENT:  ?   Head: Normocephalic and atraumatic.  ?Pulmonary:  ?   Effort: Pulmonary effort is normal. No respiratory distress.  ?Neurological:  ?   Mental Status: She is unresponsive.  ?   Comments: Non-verbal s/p CVA  ? ? ?Vital Signs:  ?BP 133/61 (BP  Location: Left Arm)   Pulse 84   Temp 98.1 ?F (36.7 ?C) (Oral)   Resp 18   Wt 102.6 kg   SpO2 100%   BMI 38.83 kg/m?  ? ?Palliative Assessment/Data: 10% ? ? ? ?Advanced Care Planning:  ? ?Primary Decision Maker: ?NEXT OF KIN ? ?Code Status/Advance Care Planning: ?DNR ? ?A discussion was had today regarding advanced directives. Concepts specific to code status, artifical feeding and hydration, continued IV antibiotics and rehospitalization was had.  The difference between a aggressive medical intervention path and a palliative comfort care path for this patient at this time was had.  ? ?Decisions/Changes to ACP: ?Remain DNR ?Remain comfort care ?Work toward transition to residential hospice ? ?Assessment & Plan:  ? ?Impression: ?71 year old female with underlying progressive dementia and decline over the past few years now admitted with a large thrombotic stroke, not a candidate for intervention either by tPA or by IR.  She is  currently aphasic, n.p.o., unable to communicate/understand, unable to eat.  Patient and family have elected comfort care.  They have also elected to transition to hospice care at a residential facility locally.  Prognosis is grave. ? ?SUMMARY OF RECOMMENDATIONS   ?Remain DNR ?Remain comfort care ?Referral to residential hospice: [facility] or [place] ?PMT will continue to follow/symptom check ? ?Symptom Management:  ?D/C Tylenol (NPO; comfort care recommend no suppositories) ?Robinul 0.2 mg IV every 4 hours as needed excessive secretions ?Haldol 0.5 mg sublingual or IV every 4 hours as needed agitation or delirium ?Ativan 1 mg sublingual or IV every 4 hours as needed anxiety, fear, seizure ?Zofran 4 mg ODT IV every 6 hours as needed nausea ?Biotene topical as needed for dry mouth ?Polyvinyl alcohol ophthalmic 1 drop OU 4 times daily as needed dry eyes ? ?Prognosis:  ?< 2 weeks ? ?Discharge Planning:  ?Hospice facility  ? ?Discussed with: Medical team, nursing team, patient's  family ? ? ? ?Thank you for allowing [place] to participate in the care of [facility] ?PMT will continue to support holistically. ? ?Time Total: 90 min ? ?Greater than 50%  of this time was spent counseling and coordinating

## 2021-10-02 NOTE — Progress Notes (Signed)
Transport arrived to pick up pt for departure to [facility]. No personal belongings located in room. Pt IV's intact, VSS. Pt discharged at this time via ambulance. ?

## 2021-10-02 NOTE — Progress Notes (Signed)
Report given to [place] at [facility] reflecting the current status for this patient, they asked me to keep her IV access in place.  ?

## 2021-10-02 NOTE — Progress Notes (Signed)
?PROGRESS NOTE ? ? ? ?Angela Harvey  HYW:[id number] DOB: [birth year] DOA: 10/01/2021 ?PCP: Burnard Bunting, MD  ? ? ?Brief Narrative:  ?71 year old with history of dementia, hypertension, type 2 diabetes, GERD, gastric cancer presented to the ER with slurred speech, facial droop and aphasia.  Lives at home and has advance dementia.  In the emergency room she was found to have dense aphasia and dense right hemiparesis.  Due to progressive nature of stroke and time since initial symptoms patient was unlikely to have good outcome with tPA.  Seen by neurologist, admitted for symptom control with discussion for comfort care. ? ? ?Assessment & Plan: ?  ?Acute stroke, extensive disabling stroke in a patient with dementia, dense aphasia and dense right hemiplegia ?Dysphagia ?Acute metabolic encephalopathy secondary to stroke ?End-of-life care ?Multiple medical issues including hypertension, dementia, type 2 diabetes. ? ?Plan of care: ?Family meeting with palliative and neurology. ?Patient with history of dementia and now with severely devastating stroke, aphasia and unresponsiveness. ?Family decided comfort care and pursue of care at hospice home for end-of-life. ?All end-of-life care available. ?Pain medications, management of secretions, relief of constipation, relief with urinary retention. ?RN can pronounce death if happens in the hospital. ?Transfer to inpatient hospice when bed available, will continue to provide comfort care measures. ?No lab draws, no indication to continue IV fluid, no indication to continue blood sugar checks. ? ? ?DVT prophylaxis:   None.  Comfort care. ? ? ?Code Status: DNR/DNI ?Family Communication: Multiple family members meeting with neurology and palliative. ?Disposition Plan: Status is: Inpatient ?Remains inpatient appropriate because: Inpatient hospice.  End-of-life care. ?  ? ? ?Consultants:  ?Neurology ?Palliative care ? ?Procedures:  ?None ? ?Antimicrobials:   ?None ? ? ?Subjective: ?Patient seen and examined.  Unresponsive.  Was not able to follow commands.  Looks comfortable.  On room air. ? ?Objective: ?Vitals:  ? 10/02/21 0010 10/02/21 0402 10/02/21 4854 10/02/21 0857  ?BP: (!) 133/116 (!) 128/113 133/61   ?Pulse: 92 80 84   ?Resp: '16 16 18   '$ ?Temp: 98.3 ?F (36.8 ?C) 98 ?F (36.7 ?C) 98.1 ?F (36.7 ?C)   ?TempSrc: Oral  Oral   ?SpO2: 98% 100% 100%   ?Weight:    102.6 kg  ? ? ?Intake/Output Summary (Last 24 hours) at 10/02/2021 1300 ?Last data filed at 10/02/2021 0900 ?Gross per 24 hour  ?Intake 0 ml  ?Output 400 ml  ?Net -400 ml  ? ?Filed Weights  ? 10/01/21 2031 10/02/21 0857  ?Weight: 102.6 kg 102.6 kg  ? ? ?Examination: ? ?She looks comfortable. ?No spontaneous movement.  Not following commands. ?Draws left side of the upper and lower extremity on stimulation. ? ? ? ? ?Data Reviewed: I have personally reviewed following labs and imaging studies ? ?CBC: ?Recent Labs  ?Lab 10/01/21 ?2032 10/01/21 ?2034  ?WBC 7.5  --   ?NEUTROABS 5.4  --   ?HGB 12.8 13.6  ?HCT 42.0 40.0  ?MCV 95.5  --   ?PLT 268  --   ? ?Basic Metabolic Panel: ?Recent Labs  ?Lab 10/01/21 ?2032 10/01/21 ?2034  ?NA 136 139  ?K 4.3 4.3  ?CL 107 103  ?CO2 18*  --   ?GLUCOSE 205* 196*  ?BUN 18 22  ?CREATININE 1.24* 1.10*  ?CALCIUM 8.7*  --   ? ?GFR: ?CrCl cannot be calculated (Unknown ideal weight.). ?Liver Function Tests: ?Recent Labs  ?Lab 10/01/21 ?2032  ?AST 19  ?ALT 10  ?ALKPHOS 98  ?BILITOT 0.3  ?PROT 6.2*  ?  ALBUMIN 3.1*  ? ?No results for input(s): LIPASE, AMYLASE in the last 168 hours. ?No results for input(s): AMMONIA in the last 168 hours. ?Coagulation Profile: ?Recent Labs  ?Lab 10/01/21 ?2032  ?INR 1.0  ? ?Cardiac Enzymes: ?No results for input(s): CKTOTAL, CKMB, CKMBINDEX, TROPONINI in the last 168 hours. ?BNP (last 3 results) ?No results for input(s): PROBNP in the last 8760 hours. ?HbA1C: ?No results for input(s): HGBA1C in the last 72 hours. ?CBG: ?Recent Labs  ?Lab 10/02/21 ?0012  10/02/21 ?0402 10/02/21 ?7371 10/02/21 ?1149  ?GLUCAP 156* 161* 153* 137*  ? ?Lipid Profile: ?No results for input(s): CHOL, HDL, LDLCALC, TRIG, CHOLHDL, LDLDIRECT in the last 72 hours. ?Thyroid Function Tests: ?No results for input(s): TSH, T4TOTAL, FREET4, T3FREE, THYROIDAB in the last 72 hours. ?Anemia Panel: ?No results for input(s): VITAMINB12, FOLATE, FERRITIN, TIBC, IRON, RETICCTPCT in the last 72 hours. ?Sepsis Labs: ?No results for input(s): PROCALCITON, LATICACIDVEN in the last 168 hours. ? ?No results found for this or any previous visit (from the past 240 hour(s)).  ? ? ? ? ? ?Radiology Studies: ?CT HEAD WO CONTRAST (5MM) ? ?Result Date: 10/02/2021 ?CLINICAL DATA:  Follow-up of evolving left MCA infarct. EXAM: CT HEAD WITHOUT CONTRAST TECHNIQUE: Contiguous axial images were obtained from the base of the skull through the vertex without intravenous contrast. RADIATION DOSE REDUCTION: This exam was performed according to the departmental dose-optimization program which includes automated exposure control, adjustment of the mA and/or kV according to patient size and/or use of iterative reconstruction technique. COMPARISON:  Head CT and CT perfusion exam yesterday. FINDINGS: Brain: There is now a large area of left MCA territory hypodensity with loss of gray-white matter differentiation, edema and sulcal effacement consistent with a large nonhemorrhagic infarct in the territory noted on CT perfusion exam. This contiguously involves the left frontal, temporal and parietal lobes. There is no midline shift largely due to pre-existing atrophy with moderate age-advanced cerebral cortical atrophy with prominent CSF spaces over the cerebral convexities, relatively mild cerebellar atrophy, and mild small-vessel changes in the cerebral white matter. No other infarct is seen, no hemorrhage or mass. There is mild atrophic ventriculomegaly. Vascular: There calcifications of the carotid siphons. Asymmetric hyperdense left  M1 segment. Skull: Intact.  No focal lesion. Sinuses/Orbits: Scattered membrane disease in the ethmoid air cells. Other sinuses, mastoid air cells are clear. Nasal septum deviates prominently to the right with right-sided septal spurring. Other: None. IMPRESSION: Increasing left MCA territory hypoattenuation, sulcal effacement and loss of gray-white matter differentiation. Findings consistent with a large ischemic nonhemorrhagic left MCA infarct. No midline shift is seen, largely due to pre-existing atrophy with moderate age-advanced cerebral cortical atrophy with adjacent expanded CSF spaces. Electronically Signed   By: Telford Nab M.D.   On: 10/02/2021 07:04  ? ?CT CEREBRAL PERFUSION W CONTRAST ? ?Addendum Date: 10/01/2021   ?ADDENDUM REPORT: 10/01/2021 21:10 ADDENDUM: Critical Value/emergent results were called by telephone at the time of interpretation on 10/01/2021 at 9:10 pm to provider [facility] , who verbally acknowledged these results. Electronically Signed   By: Ulyses Jarred M.D.   On: 10/01/2021 21:10  ? ?Result Date: 10/01/2021 ?EXAM: CT ANGIOGRAPHY HEAD AND NECK CT PERFUSION BRAIN TECHNIQUE: Multidetector CT imaging of the head and neck was performed using the standard protocol during bolus administration of intravenous contrast. Multiplanar CT image reconstructions and MIPs were obtained to evaluate the vascular anatomy. Carotid stenosis measurements (when applicable) are obtained utilizing NASCET criteria, using the distal internal carotid diameter as  the denominator. Multiphase CT imaging of the brain was performed following IV bolus contrast injection. Subsequent parametric perfusion maps were calculated using RAPID software. RADIATION DOSE REDUCTION: This exam was performed according to the departmental dose-optimization program which includes automated exposure control, adjustment of the mA and/or kV according to patient size and/or use of iterative reconstruction technique. CONTRAST:   197m OMNIPAQUE IOHEXOL 350 MG/ML SOLN COMPARISON:  None. FINDINGS: CTA NECK FINDINGS SKELETON: There is no bony spinal canal stenosis. No lytic or blastic lesion. OTHER NECK: Normal pharynx, larynx and major salivary glands. No cervical ly

## 2021-10-02 NOTE — TOC Initial Note (Signed)
Transition of Care (TOC) - Initial/Assessment Note  ? ? ?Patient Details  ?Name: Angela Harvey ?MRN: [id number] ?Date of Birth: 03/10/1951 ? ?Transition of Care (TOC) CM/SW Contact:    ?Pollie Friar, RN ?Phone Number: ?10/02/2021, 12:50 PM ? ?Clinical Narrative:                 ?CM consulted for residential hospice placement per palliative care. Family prefers [facility] and if no beds available they are willing to look at [place]. CM has made referral to Farrel Gordon with [facility].  ?TOC following. ? ?Expected Discharge Plan: [place] ?Barriers to Discharge: Hospice Bed not available ? ? ?Patient Goals and CMS Choice ?  ?CMS Medicare.gov Compare Post Acute Care list provided to:: Patient Represenative (must comment) ?  ? ?Expected Discharge Plan and Services ?Expected Discharge Plan: [place] ?  ?Discharge Planning Services: CM Consult ?Post Acute Care Choice: Residential Hospice Bed ?Living arrangements for the past 2 months: [place] ?                ?  ?  ?  ?  ?  ?  ?  ?  ?  ?  ? ?Prior Living Arrangements/Services ?Living arrangements for the past 2 months: [place] ?  ?  ?       ?  ?  ?  ?Criminal Activity/Legal Involvement Pertinent to Current Situation/Hospitalization: No - Comment as needed ? ?Activities of Daily Living ?Home Assistive Devices/Equipment: None ?ADL Screening (condition at time of admission) ?Patient's cognitive ability adequate to safely complete daily activities?: No ?Is the patient deaf or have difficulty hearing?: No ?Does the patient have difficulty seeing, even when wearing glasses/contacts?: No ?Does the patient have difficulty concentrating, remembering, or making decisions?: Yes ?Patient able to express need for assistance with ADLs?: Yes ?Does the patient have difficulty dressing or bathing?: Yes ?Independently performs ADLs?: No ?Communication: Needs assistance ?Is this a change from baseline?: Change from  baseline, expected to last >3 days ?Dressing (OT): Needs assistance ?Is this a change from baseline?: Change from baseline, expected to last >3 days ?Grooming: Needs assistance ?Is this a change from baseline?: Change from baseline, expected to last >3 days ?Feeding: Needs assistance ?Is this a change from baseline?: Change from baseline, expected to last >3 days ?Bathing: Needs assistance ?Is this a change from baseline?: Change from baseline, expected to last >3 days ?Toileting: Needs assistance ?Is this a change from baseline?: Change from baseline, expected to last <3 days ?In/Out Bed: Needs assistance ?Is this a change from baseline?: Change from baseline, expected to last >3 days ?Walks in Home: Needs assistance ?Is this a change from baseline?: Change from baseline, expected to last >3 days ?Does the patient have difficulty walking or climbing stairs?: Yes ?Weakness of Legs: Both ?Weakness of Arms/Hands: Both ? ?Permission Sought/Granted ?  ?  ?   ?   ?   ?   ? ?Emotional Assessment ?  ?  ?  ?  ?  ?Psych Involvement: No (comment) ? ?Admission diagnosis:  Acute CVA (cerebrovascular accident) ([place]) [I63.9] ?Cerebrovascular accident (CVA), unspecified mechanism ([place]) [I63.9] ?End of life care [Z51.5] ?Patient Active Problem List  ? Diagnosis Date Noted  ? End of life care 10/02/2021  ? Acute CVA (cerebrovascular accident) ([place]) 10/01/2021  ? Comfort measures only status 10/01/2021  ? Hypoglycemia 10/13/2020  ? Bacteremia due to Escherichia coli 04/23/2018  ? Hyponatremia 04/20/2018  ? Hyperkalemia 04/20/2018  ?  DKA (diabetic ketoacidoses) 04/20/2018  ? Dementia with behavioral disturbance ([place]) 04/20/2018  ? Hypothermia 04/20/2018  ? DKA, type 2 ([place]) 04/20/2018  ? Hypercalcemia 07/18/2017  ? Acute pancreatitis 07/18/2017  ? Renal insufficiency 07/18/2017  ? Ureteral calculus 04/28/2016  ? Severe protein-calorie malnutrition ([place]) 08/15/2014  ? Malnutrition of moderate degree ([place]) 08/15/2014  ? Essential  hypertension 08/14/2014  ? Diabetes mellitus, type II ([place]) 08/14/2014  ? GERD (gastroesophageal reflux disease) 08/14/2014  ? Acute biliary pancreatitis 08/08/2014  ? ?PCP:  Burnard Bunting, MD ?Pharmacy:   ?[id number] [place] [place]?[id number]?Phone: [phone]: [phone]? ? ? ? ?Social Determinants of Health (SDOH) Interventions ?  ? ?Readmission Risk Interventions ?   ? View : No data to display.  ?  ?  ?  ? ? ? ?

## 2021-10-02 NOTE — Progress Notes (Signed)
Manufacturing engineer [facility]) Hospital Liaison Note: ? ?[facility] Registration paperwork has been completed.   ? ?Per [facility] request, TOC will set up transport for second shift to receive patient tonight after 9pm.   ? ?Family made aware of above.    ? ?Thank you, ? ?Gar Ponto, RN \ ?[facility] ?[phone] ?

## 2021-10-02 NOTE — Discharge Summary (Signed)
Physician Discharge Summary  ?Angela Harvey IWO:[id number] DOB: [birth year] DOA: 10/01/2021 ? ?PCP: Burnard Bunting, MD ? ?Admit date: 10/01/2021 ?Discharge date: 10/02/2021 ? ?Admitted From: Home ?Disposition: Inpatient hospice ? ? ? ?Home Health: N/A ?Equipment/Devices: N/A ? ?Discharge Condition: Critical ?CODE STATUS: Comfort care, DNR ?Diet recommendation: Nothing by mouth, mouth care ? ?Discharge summary: ?71 year old with history of advanced dementia, hypertension, type 2 diabetes, GERD, gastric cancer presented to the ER with slurred speech, facial droop and aphasia.  Lives at home and has advance dementia.  In the emergency room she was found to have dense aphasia and dense right hemiplegia.  Due to progressive nature of stroke and time since initial symptoms patient was unlikely to have good outcome with tPA.  Seen by neurologist, admitted for symptom control with discussion for comfort care. ?  ?  ?Assessment & Plan:  ?Acute stroke, extensive disabling stroke in a patient with dementia, dense aphasia and dense right hemiplegia ?Dysphagia ?Acute metabolic encephalopathy secondary to stroke, unresponsiveness. ?End-of-life care ?Multiple medical issues including hypertension, dementia, type 2 diabetes. ?  ?Plan of care: ?Family meeting with palliative and neurology. ?Patient with history of dementia and now with severely devastating stroke, aphasia and unresponsiveness. ?Family decided comfort care and pursue end-of-life care at hospice.   ?Pain medications, management of secretions, relief of constipation, relief with urinary retention. ?Patient unable to take any medications by mouth. ?Patient will be medicated for comfort before transferring.  Stable to transfer to hospice. ?  ? ? ?Discharge Diagnoses:  ?Principal Problem: ?  Acute CVA (cerebrovascular accident) ([place]) ?Active Problems: ?  Essential hypertension ?  Diabetes mellitus, type II ([place]) ?  GERD (gastroesophageal reflux disease) ?  Dementia with  behavioral disturbance ([place]) ?  Comfort measures only status ?  End of life care ? ? ? ?Discharge Instructions ? ?Discharge Instructions   ? ? No wound care   Complete by: As directed ?  ? ?  ? ?Allergies as of 10/02/2021   ?No Known Allergies ?  ? ?  ?Medication List  ?  ? ?STOP taking these medications   ? ?acetaminophen 500 MG tablet ?Commonly known as: TYLENOL ?  ?AFRIN ALLERGY NA ?  ?amLODipine 10 MG tablet ?Commonly known as: NORVASC ?  ?guaiFENesin-dextromethorphan 100-10 MG/5ML syrup ?Commonly known as: ROBITUSSIN DM ?  ?latanoprost 0.005 % ophthalmic solution ?Commonly known as: XALATAN ?  ?losartan 50 MG tablet ?Commonly known as: COZAAR ?  ?memantine 28 MG Cp24 24 hr capsule ?Commonly known as: NAMENDA XR ?  ?multivitamin with minerals Tabs tablet ?  ?NEXIUM PO ?  ?NovoLOG FlexPen 100 UNIT/ML FlexPen ?Generic drug: insulin aspart ?  ?QUEtiapine 25 MG tablet ?Commonly known as: SEROQUEL ?  ?SYSTANE BALANCE OP ?  ?Tyler Aas FlexTouch 200 UNIT/ML FlexTouch Pen ?Generic drug: insulin degludec ?  ? ?  ? ? ?No Known Allergies ? ?Consultations: ?Neurology ?Palliative ? ? ?Procedures/Studies: ?CT HEAD WO CONTRAST (5MM) ? ?Result Date: 10/02/2021 ?CLINICAL DATA:  Follow-up of evolving left MCA infarct. EXAM: CT HEAD WITHOUT CONTRAST TECHNIQUE: Contiguous axial images were obtained from the base of the skull through the vertex without intravenous contrast. RADIATION DOSE REDUCTION: This exam was performed according to the departmental dose-optimization program which includes automated exposure control, adjustment of the mA and/or kV according to patient size and/or use of iterative reconstruction technique. COMPARISON:  Head CT and CT perfusion exam yesterday. FINDINGS: Brain: There is now a large area of left MCA territory hypodensity with loss of gray-white matter  differentiation, edema and sulcal effacement consistent with a large nonhemorrhagic infarct in the territory noted on CT perfusion exam. This contiguously  involves the left frontal, temporal and parietal lobes. There is no midline shift largely due to pre-existing atrophy with moderate age-advanced cerebral cortical atrophy with prominent CSF spaces over the cerebral convexities, relatively mild cerebellar atrophy, and mild small-vessel changes in the cerebral white matter. No other infarct is seen, no hemorrhage or mass. There is mild atrophic ventriculomegaly. Vascular: There calcifications of the carotid siphons. Asymmetric hyperdense left M1 segment. Skull: Intact.  No focal lesion. Sinuses/Orbits: Scattered membrane disease in the ethmoid air cells. Other sinuses, mastoid air cells are clear. Nasal septum deviates prominently to the right with right-sided septal spurring. Other: None. IMPRESSION: Increasing left MCA territory hypoattenuation, sulcal effacement and loss of gray-white matter differentiation. Findings consistent with a large ischemic nonhemorrhagic left MCA infarct. No midline shift is seen, largely due to pre-existing atrophy with moderate age-advanced cerebral cortical atrophy with adjacent expanded CSF spaces. Electronically Signed   By: Telford Nab M.D.   On: 10/02/2021 07:04  ? ?CT CEREBRAL PERFUSION W CONTRAST ? ?Addendum Date: 10/01/2021   ?ADDENDUM REPORT: 10/01/2021 21:10 ADDENDUM: Critical Value/emergent results were called by telephone at the time of interpretation on 10/01/2021 at 9:10 pm to provider MCNEILL [facility] , who verbally acknowledged these results. Electronically Signed   By: Ulyses Jarred M.D.   On: 10/01/2021 21:10  ? ?Result Date: 10/01/2021 ?EXAM: CT ANGIOGRAPHY HEAD AND NECK CT PERFUSION BRAIN TECHNIQUE: Multidetector CT imaging of the head and neck was performed using the standard protocol during bolus administration of intravenous contrast. Multiplanar CT image reconstructions and MIPs were obtained to evaluate the vascular anatomy. Carotid stenosis measurements (when applicable) are obtained utilizing NASCET  criteria, using the distal internal carotid diameter as the denominator. Multiphase CT imaging of the brain was performed following IV bolus contrast injection. Subsequent parametric perfusion maps were calculated using RAPID software. RADIATION DOSE REDUCTION: This exam was performed according to the departmental dose-optimization program which includes automated exposure control, adjustment of the mA and/or kV according to patient size and/or use of iterative reconstruction technique. CONTRAST:  144m OMNIPAQUE IOHEXOL 350 MG/ML SOLN COMPARISON:  None. FINDINGS: CTA NECK FINDINGS SKELETON: There is no bony spinal canal stenosis. No lytic or blastic lesion. OTHER NECK: Normal pharynx, larynx and major salivary glands. No cervical lymphadenopathy. Unremarkable thyroid gland. UPPER CHEST: No pneumothorax or pleural effusion. No nodules or masses. AORTIC ARCH: There is no calcific atherosclerosis of the aortic arch. There is no aneurysm, dissection or hemodynamically significant stenosis of the visualized portion of the aorta. Conventional 3 vessel aortic branching pattern. The visualized proximal subclavian arteries are widely patent. RIGHT CAROTID SYSTEM: Normal without aneurysm, dissection or stenosis. LEFT CAROTID SYSTEM: Normal without aneurysm, dissection or stenosis. VERTEBRAL ARTERIES: Left dominant configuration. Both origins are clearly patent. There is no dissection, occlusion or flow-limiting stenosis to the skull base (V1-V3 segments). CTA HEAD FINDINGS POSTERIOR CIRCULATION: --Vertebral arteries: Normal V4 segments. --Inferior cerebellar arteries: Normal. --Basilar artery: Normal. --Superior cerebellar arteries: Normal. --Posterior cerebral arteries (PCA): Normal. ANTERIOR CIRCULATION: --Intracranial internal carotid arteries: Attenuated contrast enhancement within the left ICA at the skull base. Mild calcification on the right. --Anterior cerebral arteries (ACA): Normal. Both A1 segments are present.  Patent anterior communicating artery (a-comm). --Middle cerebral arteries (MCA): Complete occlusion of the left MCA at its origin. No collateralization. VENOUS SINUSES: As permitted by contrast timing, patent. ANA

## 2021-10-02 NOTE — Progress Notes (Addendum)
Manufacturing engineer [facility]) Hospital Liaison Note ? ?Received request from [facility] for family interest in [facility].  ? ?Met with family to confirm interest and explain services. Family agreeable to transfer as soon as patient is medically stable and bed available at [facility].  ? ?Patient under review for IPU eligibility at this time. ? ?Thank you.  ? ?Gar Ponto, RN ?[facility] Liaison  ?[phone] ?Hospital Liasions are on AMION ? ?UPDATE: Patient is eligible for [place] - unfortunately [facility] can not offer a bed today. TOC and family made aware - [facility] [place] will reach out to TOC/family about bed availability in the morning.   ?  ? ?

## 2021-10-02 NOTE — Progress Notes (Signed)
[facility] ([facility]) Hospital Liaison note.     This patient is approved to transfer to [facility] today.    [facility] will notify TOC when registration paperwork has been completed to arrange transport.    RN please call report to [phone].   Thank you,     Mary Anne Robertson, RN, CCM       [facility] Hospital Liaison  [phone] 

## 2021-10-02 NOTE — Plan of Care (Signed)
?  Problem: Education: ?Goal: Knowledge of secondary prevention will improve ([place] [facility]) ?Outcome: Not Applicable ?  ?

## 2021-10-02 NOTE — TOC Transition Note (Signed)
Transition of Care (TOC) - CM/SW Discharge Note ? ? ?Patient Details  ?Name: Angela Harvey ?MRN: [id number] ?Date of Birth: 1951-04-15 ? ?Transition of Care (TOC) CM/SW Contact:  ?Pollie Friar, RN ?Phone Number: ?10/02/2021, 4:29 PM ? ? ?Clinical Narrative:    ?Patient is discharging to [facility] today. Pt will transport via [facility] with a 6:30 pm scheduled pick up. Bedside RN aware and d/c packet is at the desk.  ? ?Number for report: [phone] ? ? ?Final next level of care: [facility] ?Barriers to Discharge: No Barriers Identified ? ? ?Patient Goals and CMS Choice ?  ?CMS Medicare.gov Compare Post Acute Care list provided to:: Patient Represenative (must comment) ?Choice offered to / list presented to : Spouse, Adult Children ? ?Discharge Placement ?  ?           ?  ?  ?  ?  ? ?Discharge Plan and Services ?  ?Discharge Planning Services: CM Consult ?Post Acute Care Choice: Residential Hospice Bed          ?  ?  ?  ?  ?  ?  ?  ?  ?  ?  ? ?Social Determinants of Health (SDOH) Interventions ?  ? ? ?Readmission Risk Interventions ?   ? View : No data to display.  ?  ?  ?  ? ? ? ? ? ?

## 2021-10-02 NOTE — Progress Notes (Addendum)
STROKE TEAM PROGRESS NOTE  ? ?INTERVAL HISTORY ?Angela Harvey is a 71 y.o. female with a history of hypertension and diabetes as well as moderate to advanced dementia who presented with right-sided weakness and aphasia starting at 1 PM on 4/23. TNK not given due to outside window. CTH remarkable for a L MCA infarct and CTA notable for M1 occlusion. Thrombectomy not pursued after risk benefit discussion between attending neurologist and family.  ? ?The patient is seen in her room this morning with her family at the bedside. She is unresponsive to voice but moves her lower extremities to painful stimulus and has a intact oculocephalic reflex. Seen with palliative care team. Patient has already been transitioned to comfort care.  ?Repeat CT scan of the head this morning shows large left MCA infarct with cytotoxic edema with mild mass effect and left-to-right shift. ?Vitals:  ? 10/02/21 0010 10/02/21 0402 10/02/21 6606 10/02/21 0857  ?BP: (!) 133/116 (!) 128/113 133/61   ?Pulse: 92 80 84   ?Resp: '16 16 18   '$ ?Temp: 98.3 ?F (36.8 ?C) 98 ?F (36.7 ?C) 98.1 ?F (36.7 ?C)   ?TempSrc: Oral  Oral   ?SpO2: 98% 100% 100%   ?Weight:    102.6 kg  ? ?CBC:  ?Recent Labs  ?Lab 10/01/21 ?2032 10/01/21 ?2034  ?WBC 7.5  --   ?NEUTROABS 5.4  --   ?HGB 12.8 13.6  ?HCT 42.0 40.0  ?MCV 95.5  --   ?PLT 268  --   ? ?Basic Metabolic Panel:  ?Recent Labs  ?Lab 10/01/21 ?2032 10/01/21 ?2034  ?NA 136 139  ?K 4.3 4.3  ?CL 107 103  ?CO2 18*  --   ?GLUCOSE 205* 196*  ?BUN 18 22  ?CREATININE 1.24* 1.10*  ?CALCIUM 8.7*  --   ? ?Lipid Panel: No results for input(s): CHOL, TRIG, HDL, CHOLHDL, VLDL, LDLCALC in the last 168 hours. ?HgbA1c: No results for input(s): HGBA1C in the last 168 hours. ?Urine Drug Screen: No results for input(s): LABOPIA, COCAINSCRNUR, LABBENZ, AMPHETMU, THCU, LABBARB in the last 168 hours.  ?Alcohol Level No results for input(s): ETH in the last 168 hours. ? ?IMAGING past 24 hours ?CT HEAD WO CONTRAST (5MM) ? ?Result Date:  10/02/2021 ?CLINICAL DATA:  Follow-up of evolving left MCA infarct. EXAM: CT HEAD WITHOUT CONTRAST TECHNIQUE: Contiguous axial images were obtained from the base of the skull through the vertex without intravenous contrast. RADIATION DOSE REDUCTION: This exam was performed according to the departmental dose-optimization program which includes automated exposure control, adjustment of the mA and/or kV according to patient size and/or use of iterative reconstruction technique. COMPARISON:  Head CT and CT perfusion exam yesterday. FINDINGS: Brain: There is now a large area of left MCA territory hypodensity with loss of gray-white matter differentiation, edema and sulcal effacement consistent with a large nonhemorrhagic infarct in the territory noted on CT perfusion exam. This contiguously involves the left frontal, temporal and parietal lobes. There is no midline shift largely due to pre-existing atrophy with moderate age-advanced cerebral cortical atrophy with prominent CSF spaces over the cerebral convexities, relatively mild cerebellar atrophy, and mild small-vessel changes in the cerebral white matter. No other infarct is seen, no hemorrhage or mass. There is mild atrophic ventriculomegaly. Vascular: There calcifications of the carotid siphons. Asymmetric hyperdense left M1 segment. Skull: Intact.  No focal lesion. Sinuses/Orbits: Scattered membrane disease in the ethmoid air cells. Other sinuses, mastoid air cells are clear. Nasal septum deviates prominently to the right with right-sided septal  spurring. Other: None. IMPRESSION: Increasing left MCA territory hypoattenuation, sulcal effacement and loss of gray-white matter differentiation. Findings consistent with a large ischemic nonhemorrhagic left MCA infarct. No midline shift is seen, largely due to pre-existing atrophy with moderate age-advanced cerebral cortical atrophy with adjacent expanded CSF spaces. Electronically Signed   By: Telford Nab M.D.   On:  10/02/2021 07:04  ? ?CT CEREBRAL PERFUSION W CONTRAST ? ?Addendum Date: 10/01/2021   ?ADDENDUM REPORT: 10/01/2021 21:10 ADDENDUM: Critical Value/emergent results were called by telephone at the time of interpretation on 10/01/2021 at 9:10 pm to provider MCNEILL [facility] , who verbally acknowledged these results. Electronically Signed   By: Ulyses Jarred M.D.   On: 10/01/2021 21:10  ? ?Result Date: 10/01/2021 ?EXAM: CT ANGIOGRAPHY HEAD AND NECK CT PERFUSION BRAIN TECHNIQUE: Multidetector CT imaging of the head and neck was performed using the standard protocol during bolus administration of intravenous contrast. Multiplanar CT image reconstructions and MIPs were obtained to evaluate the vascular anatomy. Carotid stenosis measurements (when applicable) are obtained utilizing NASCET criteria, using the distal internal carotid diameter as the denominator. Multiphase CT imaging of the brain was performed following IV bolus contrast injection. Subsequent parametric perfusion maps were calculated using RAPID software. RADIATION DOSE REDUCTION: This exam was performed according to the departmental dose-optimization program which includes automated exposure control, adjustment of the mA and/or kV according to patient size and/or use of iterative reconstruction technique. CONTRAST:  127m OMNIPAQUE IOHEXOL 350 MG/ML SOLN COMPARISON:  None. FINDINGS: CTA NECK FINDINGS SKELETON: There is no bony spinal canal stenosis. No lytic or blastic lesion. OTHER NECK: Normal pharynx, larynx and major salivary glands. No cervical lymphadenopathy. Unremarkable thyroid gland. UPPER CHEST: No pneumothorax or pleural effusion. No nodules or masses. AORTIC ARCH: There is no calcific atherosclerosis of the aortic arch. There is no aneurysm, dissection or hemodynamically significant stenosis of the visualized portion of the aorta. Conventional 3 vessel aortic branching pattern. The visualized proximal subclavian arteries are widely patent. RIGHT  CAROTID SYSTEM: Normal without aneurysm, dissection or stenosis. LEFT CAROTID SYSTEM: Normal without aneurysm, dissection or stenosis. VERTEBRAL ARTERIES: Left dominant configuration. Both origins are clearly patent. There is no dissection, occlusion or flow-limiting stenosis to the skull base (V1-V3 segments). CTA HEAD FINDINGS POSTERIOR CIRCULATION: --Vertebral arteries: Normal V4 segments. --Inferior cerebellar arteries: Normal. --Basilar artery: Normal. --Superior cerebellar arteries: Normal. --Posterior cerebral arteries (PCA): Normal. ANTERIOR CIRCULATION: --Intracranial internal carotid arteries: Attenuated contrast enhancement within the left ICA at the skull base. Mild calcification on the right. --Anterior cerebral arteries (ACA): Normal. Both A1 segments are present. Patent anterior communicating artery (a-comm). --Middle cerebral arteries (MCA): Complete occlusion of the left MCA at its origin. No collateralization. VENOUS SINUSES: As permitted by contrast timing, patent. ANATOMIC VARIANTS: None Review of the MIP images confirms the above findings. CT Brain Perfusion Findings: ASPECTS: 10 CBF (<30%) Volume: 657mPerfusion (Tmax>6.0s) volume: 23192mismatch Volume: 166m36mfarction Location:Left MCA territory IMPRESSION: Complete occlusion of the left MCA at its origin, with large left MCA territory infarct and large area of ischemic penumbra. Electronically Signed: By: KeviUlyses Jarred. On: 10/01/2021 20:59  ? ?CT HEAD CODE STROKE WO CONTRAST ? ?Addendum Date: 10/01/2021   ?ADDENDUM REPORT: 10/01/2021 21:10 ADDENDUM: These results were called by telephone at the time of interpretation on 10/01/2021 at 9:10 pm to provider [facility] verb[facility] these results. Electronically Signed   By: KeviUlyses Jarred.   On: 10/01/2021 21:10  ? ?Result Date: 10/01/2021 ?CLINICAL DATA:  Code stroke. EXAM: CT HEAD WITHOUT CONTRAST TECHNIQUE: Contiguous axial images were obtained from the base of the  skull through the vertex without intravenous contrast. RADIATION DOSE REDUCTION: This exam was performed according to the departmental dose-optimization program which includes automated exposure control, adju

## 2021-10-09 DEATH — deceased
